# Patient Record
Sex: Female | Born: 1935
Health system: Southern US, Community
[De-identification: ages and names within clinical notes are randomized; demographics above are authoritative.]

## PROBLEM LIST (undated history)

## (undated) DIAGNOSIS — C801 Malignant (primary) neoplasm, unspecified: Secondary | ICD-10-CM

## (undated) DIAGNOSIS — E785 Hyperlipidemia, unspecified: Secondary | ICD-10-CM

## (undated) DIAGNOSIS — R7611 Nonspecific reaction to tuberculin skin test without active tuberculosis: Secondary | ICD-10-CM

## (undated) DIAGNOSIS — M179 Osteoarthritis of knee, unspecified: Secondary | ICD-10-CM

## (undated) DIAGNOSIS — K5730 Diverticulosis of large intestine without perforation or abscess without bleeding: Secondary | ICD-10-CM

## (undated) DIAGNOSIS — Z923 Personal history of irradiation: Secondary | ICD-10-CM

## (undated) DIAGNOSIS — I10 Essential (primary) hypertension: Secondary | ICD-10-CM

## (undated) DIAGNOSIS — M1710 Unilateral primary osteoarthritis, unspecified knee: Secondary | ICD-10-CM

## (undated) HISTORY — DX: Unilateral primary osteoarthritis, unspecified knee: M17.10

## (undated) HISTORY — DX: Diverticulosis of large intestine without perforation or abscess without bleeding: K57.30

## (undated) HISTORY — DX: Malignant (primary) neoplasm, unspecified: C80.1

## (undated) HISTORY — DX: Nonspecific reaction to tuberculin skin test without active tuberculosis: R76.11

## (undated) HISTORY — DX: Osteoarthritis of knee, unspecified: M17.9

## (undated) HISTORY — DX: Essential (primary) hypertension: I10

## (undated) HISTORY — DX: Hyperlipidemia, unspecified: E78.5

---

## 1998-08-01 ENCOUNTER — Ambulatory Visit (HOSPITAL_COMMUNITY): Admission: RE | Admit: 1998-08-01 | Discharge: 1998-08-01 | Payer: Self-pay | Admitting: Obstetrics & Gynecology

## 1999-08-20 ENCOUNTER — Encounter: Payer: Self-pay | Admitting: Internal Medicine

## 1999-08-20 ENCOUNTER — Ambulatory Visit (HOSPITAL_COMMUNITY): Admission: RE | Admit: 1999-08-20 | Discharge: 1999-08-20 | Payer: Self-pay | Admitting: Internal Medicine

## 1999-11-24 ENCOUNTER — Emergency Department (HOSPITAL_COMMUNITY): Admission: EM | Admit: 1999-11-24 | Discharge: 1999-11-24 | Payer: Self-pay | Admitting: *Deleted

## 2002-01-17 LAB — HM COLONOSCOPY

## 2002-09-16 ENCOUNTER — Encounter: Payer: Self-pay | Admitting: Emergency Medicine

## 2002-09-16 ENCOUNTER — Emergency Department (HOSPITAL_COMMUNITY): Admission: EM | Admit: 2002-09-16 | Discharge: 2002-09-16 | Payer: Self-pay | Admitting: Emergency Medicine

## 2005-05-25 ENCOUNTER — Ambulatory Visit: Payer: Self-pay | Admitting: Internal Medicine

## 2005-05-27 ENCOUNTER — Ambulatory Visit: Payer: Self-pay | Admitting: Internal Medicine

## 2005-06-30 ENCOUNTER — Ambulatory Visit (HOSPITAL_COMMUNITY): Admission: RE | Admit: 2005-06-30 | Discharge: 2005-06-30 | Payer: Self-pay | Admitting: Internal Medicine

## 2006-07-12 ENCOUNTER — Ambulatory Visit: Payer: Self-pay | Admitting: Internal Medicine

## 2006-07-12 LAB — CONVERTED CEMR LAB
CO2: 29 meq/L (ref 19–32)
Calcium: 9.6 mg/dL (ref 8.4–10.5)
Chol/HDL Ratio, serum: 3.6
Cholesterol: 192 mg/dL (ref 0–200)
Creatinine, Ser: 0.8 mg/dL (ref 0.4–1.2)
Crystals: NEGATIVE
Eosinophil percent: 2 % (ref 0.0–5.0)
GFR calc non Af Amer: 74 mL/min
Glomerular Filtration Rate, Af Am: 90 mL/min/{1.73_m2}
HCT: 39 % (ref 36.0–46.0)
HDL: 53.6 mg/dL (ref >39.0)
Hemoglobin, Urine: NEGATIVE
Lymphocytes Relative: 40.7 % (ref 12.0–46.0)
MCHC: 33.2 g/dL (ref 30.0–36.0)
MCV: 84.7 fL (ref 78.0–100.0)
Monocytes Absolute: 0.3 10*3/uL (ref 0.2–0.7)
Monocytes Relative: 5.8 % (ref 3.0–11.0)
Neutro Abs: 2.5 10*3/uL (ref 1.4–7.7)
Platelets: 403 10*3/uL — ABNORMAL HIGH (ref 150–400)
Potassium: 3.3 meq/L — ABNORMAL LOW (ref 3.5–5.1)
RBC / HPF: NONE SEEN
RBC: 4.6 M/uL (ref 3.87–5.11)
Total Protein, Urine: NEGATIVE mg/dL
Total Protein: 7.4 g/dL (ref 6.0–8.3)
Triglyceride fasting, serum: 70 mg/dL (ref 0–149)
Urobilinogen, UA: 0.2 (ref 0.0–1.0)
VLDL: 14 mg/dL (ref 0–40)
WBC: 4.9 10*3/uL (ref 4.5–10.5)
pH: 6.5 (ref 5.0–8.0)

## 2006-07-13 ENCOUNTER — Ambulatory Visit (HOSPITAL_COMMUNITY): Admission: RE | Admit: 2006-07-13 | Discharge: 2006-07-13 | Payer: Self-pay | Admitting: Internal Medicine

## 2006-07-23 ENCOUNTER — Encounter (INDEPENDENT_AMBULATORY_CARE_PROVIDER_SITE_OTHER): Payer: Self-pay | Admitting: Specialist

## 2006-07-23 ENCOUNTER — Encounter: Admission: RE | Admit: 2006-07-23 | Discharge: 2006-07-23 | Payer: Self-pay | Admitting: Internal Medicine

## 2006-07-23 ENCOUNTER — Encounter (INDEPENDENT_AMBULATORY_CARE_PROVIDER_SITE_OTHER): Payer: Self-pay | Admitting: Diagnostic Radiology

## 2006-08-01 ENCOUNTER — Encounter: Admission: RE | Admit: 2006-08-01 | Discharge: 2006-08-01 | Payer: Self-pay | Admitting: Internal Medicine

## 2006-08-03 ENCOUNTER — Emergency Department (HOSPITAL_COMMUNITY): Admission: EM | Admit: 2006-08-03 | Discharge: 2006-08-03 | Payer: Self-pay | Admitting: Emergency Medicine

## 2006-08-04 ENCOUNTER — Ambulatory Visit: Payer: Self-pay | Admitting: Family Medicine

## 2006-08-25 DIAGNOSIS — Z853 Personal history of malignant neoplasm of breast: Secondary | ICD-10-CM

## 2006-08-31 ENCOUNTER — Ambulatory Visit: Payer: Self-pay | Admitting: Internal Medicine

## 2006-08-31 LAB — CONVERTED CEMR LAB
BUN: 8 mg/dL (ref 6–23)
Calcium: 9.4 mg/dL (ref 8.4–10.5)
Chloride: 104 meq/L (ref 96–112)
Cholesterol: 188 mg/dL (ref 0–200)
GFR calc Af Amer: 445 mL/min
GFR calc non Af Amer: 368 mL/min
Glucose, Bld: 89 mg/dL (ref 70–99)
HDL: 50.6 mg/dL (ref >39.0)
Total CHOL/HDL Ratio: 3.7

## 2007-05-05 DIAGNOSIS — R7611 Nonspecific reaction to tuberculin skin test without active tuberculosis: Secondary | ICD-10-CM | POA: Insufficient documentation

## 2007-05-05 DIAGNOSIS — E118 Type 2 diabetes mellitus with unspecified complications: Secondary | ICD-10-CM | POA: Insufficient documentation

## 2007-05-05 DIAGNOSIS — I10 Essential (primary) hypertension: Secondary | ICD-10-CM | POA: Insufficient documentation

## 2007-05-05 DIAGNOSIS — M810 Age-related osteoporosis without current pathological fracture: Secondary | ICD-10-CM | POA: Insufficient documentation

## 2007-05-05 DIAGNOSIS — K5730 Diverticulosis of large intestine without perforation or abscess without bleeding: Secondary | ICD-10-CM | POA: Insufficient documentation

## 2007-05-05 DIAGNOSIS — E785 Hyperlipidemia, unspecified: Secondary | ICD-10-CM | POA: Insufficient documentation

## 2007-05-05 DIAGNOSIS — M1710 Unilateral primary osteoarthritis, unspecified knee: Secondary | ICD-10-CM

## 2007-08-04 ENCOUNTER — Encounter: Admission: RE | Admit: 2007-08-04 | Discharge: 2007-08-04 | Payer: Self-pay | Admitting: Internal Medicine

## 2007-08-30 ENCOUNTER — Encounter: Payer: Self-pay | Admitting: Internal Medicine

## 2007-10-31 ENCOUNTER — Encounter: Payer: Self-pay | Admitting: Internal Medicine

## 2008-01-30 ENCOUNTER — Telehealth (INDEPENDENT_AMBULATORY_CARE_PROVIDER_SITE_OTHER): Payer: Self-pay | Admitting: *Deleted

## 2008-01-31 ENCOUNTER — Telehealth (INDEPENDENT_AMBULATORY_CARE_PROVIDER_SITE_OTHER): Payer: Self-pay | Admitting: *Deleted

## 2008-02-27 ENCOUNTER — Telehealth: Payer: Self-pay | Admitting: Internal Medicine

## 2008-03-29 ENCOUNTER — Telehealth: Payer: Self-pay | Admitting: Internal Medicine

## 2008-04-02 ENCOUNTER — Encounter: Payer: Self-pay | Admitting: Internal Medicine

## 2008-04-02 ENCOUNTER — Ambulatory Visit: Payer: Self-pay | Admitting: Internal Medicine

## 2008-04-02 DIAGNOSIS — I509 Heart failure, unspecified: Secondary | ICD-10-CM | POA: Insufficient documentation

## 2008-04-11 ENCOUNTER — Encounter: Payer: Self-pay | Admitting: Internal Medicine

## 2008-04-11 ENCOUNTER — Ambulatory Visit: Payer: Self-pay

## 2008-04-27 ENCOUNTER — Telehealth: Payer: Self-pay | Admitting: Internal Medicine

## 2008-06-29 HISTORY — PX: BREAST LUMPECTOMY: SHX2

## 2008-06-29 HISTORY — PX: BREAST SURGERY: SHX581

## 2008-07-23 ENCOUNTER — Telehealth: Payer: Self-pay | Admitting: Internal Medicine

## 2008-07-30 ENCOUNTER — Telehealth (INDEPENDENT_AMBULATORY_CARE_PROVIDER_SITE_OTHER): Payer: Self-pay | Admitting: *Deleted

## 2008-09-20 ENCOUNTER — Encounter: Admission: RE | Admit: 2008-09-20 | Discharge: 2008-09-20 | Payer: Self-pay | Admitting: Internal Medicine

## 2008-10-01 ENCOUNTER — Encounter: Payer: Self-pay | Admitting: Internal Medicine

## 2008-10-08 ENCOUNTER — Telehealth (INDEPENDENT_AMBULATORY_CARE_PROVIDER_SITE_OTHER): Payer: Self-pay | Admitting: *Deleted

## 2008-10-10 ENCOUNTER — Ambulatory Visit (HOSPITAL_COMMUNITY): Admission: RE | Admit: 2008-10-10 | Discharge: 2008-10-10 | Payer: Self-pay | Admitting: Surgery

## 2008-10-10 ENCOUNTER — Encounter: Admission: RE | Admit: 2008-10-10 | Discharge: 2008-10-10 | Payer: Self-pay | Admitting: Surgery

## 2008-10-10 ENCOUNTER — Encounter (INDEPENDENT_AMBULATORY_CARE_PROVIDER_SITE_OTHER): Payer: Self-pay | Admitting: Surgery

## 2008-10-12 ENCOUNTER — Ambulatory Visit: Payer: Self-pay | Admitting: Oncology

## 2008-10-22 ENCOUNTER — Encounter: Payer: Self-pay | Admitting: Internal Medicine

## 2008-10-31 ENCOUNTER — Encounter: Payer: Self-pay | Admitting: Internal Medicine

## 2008-10-31 LAB — CBC WITH DIFFERENTIAL/PLATELET
BASO%: 0.3 % (ref 0.0–2.0)
LYMPH%: 29.6 % (ref 14.0–49.7)
MCHC: 33.7 g/dL (ref 31.5–36.0)
MCV: 84 fL (ref 79.5–101.0)
MONO%: 8.6 % (ref 0.0–14.0)
Platelets: 376 10*3/uL (ref 145–400)
RBC: 4.21 10*6/uL (ref 3.70–5.45)

## 2008-11-01 LAB — COMPREHENSIVE METABOLIC PANEL
ALT: 12 U/L (ref 0–35)
Alkaline Phosphatase: 79 U/L (ref 39–117)
Creatinine, Ser: 0.71 mg/dL (ref 0.40–1.20)
Glucose, Bld: 104 mg/dL — ABNORMAL HIGH (ref 70–99)
Sodium: 137 mEq/L (ref 135–145)
Total Bilirubin: 0.3 mg/dL (ref 0.3–1.2)
Total Protein: 7.5 g/dL (ref 6.0–8.3)

## 2008-11-01 LAB — CANCER ANTIGEN 27.29: CA 27.29: 24 U/mL (ref 0–39)

## 2008-11-01 LAB — VITAMIN D 25 HYDROXY (VIT D DEFICIENCY, FRACTURES): Vit D, 25-Hydroxy: 43 ng/mL (ref 30–89)

## 2008-11-06 ENCOUNTER — Encounter: Payer: Self-pay | Admitting: Internal Medicine

## 2008-11-13 ENCOUNTER — Ambulatory Visit: Admission: RE | Admit: 2008-11-13 | Discharge: 2009-01-30 | Payer: Self-pay | Admitting: Radiation Oncology

## 2008-11-14 ENCOUNTER — Encounter: Payer: Self-pay | Admitting: Internal Medicine

## 2008-11-22 ENCOUNTER — Encounter: Payer: Self-pay | Admitting: Internal Medicine

## 2008-11-22 ENCOUNTER — Ambulatory Visit: Payer: Self-pay | Admitting: Internal Medicine

## 2009-01-23 ENCOUNTER — Encounter: Payer: Self-pay | Admitting: Internal Medicine

## 2009-02-12 ENCOUNTER — Ambulatory Visit: Payer: Self-pay | Admitting: Oncology

## 2009-02-14 ENCOUNTER — Encounter: Payer: Self-pay | Admitting: Internal Medicine

## 2009-02-14 LAB — COMPREHENSIVE METABOLIC PANEL
ALT: 22 U/L (ref 0–35)
AST: 36 U/L (ref 0–37)
Alkaline Phosphatase: 70 U/L (ref 39–117)
Sodium: 138 mEq/L (ref 135–145)
Total Bilirubin: 0.4 mg/dL (ref 0.3–1.2)
Total Protein: 7.7 g/dL (ref 6.0–8.3)

## 2009-02-14 LAB — CBC WITH DIFFERENTIAL/PLATELET
BASO%: 0.5 % (ref 0.0–2.0)
LYMPH%: 36.9 % (ref 14.0–49.7)
MCHC: 33.6 g/dL (ref 31.5–36.0)
MCV: 85.6 fL (ref 79.5–101.0)
MONO#: 0.3 10*3/uL (ref 0.1–0.9)
MONO%: 7.3 % (ref 0.0–14.0)
Platelets: 343 10*3/uL (ref 145–400)
RBC: 4.27 10*6/uL (ref 3.70–5.45)
WBC: 3.9 10*3/uL (ref 3.9–10.3)

## 2009-02-15 ENCOUNTER — Telehealth: Payer: Self-pay | Admitting: Internal Medicine

## 2009-02-20 ENCOUNTER — Encounter: Payer: Self-pay | Admitting: Internal Medicine

## 2009-04-09 ENCOUNTER — Telehealth: Payer: Self-pay | Admitting: Internal Medicine

## 2009-04-18 ENCOUNTER — Ambulatory Visit: Payer: Self-pay | Admitting: Internal Medicine

## 2009-04-18 LAB — CONVERTED CEMR LAB
BUN: 14 mg/dL (ref 6–23)
Bilirubin Urine: NEGATIVE
CO2: 28 meq/L (ref 19–32)
Calcium: 9.5 mg/dL (ref 8.4–10.5)
Chloride: 103 meq/L (ref 96–112)
Cholesterol, target level: 200 mg/dL
Cholesterol: 134 mg/dL (ref 0–200)
Creatinine, Ser: 0.9 mg/dL (ref 0.4–1.2)
Eosinophils Relative: 1.8 % (ref 0.0–5.0)
Glucose, Bld: 89 mg/dL (ref 70–99)
HDL goal, serum: 40 mg/dL
Hemoglobin, Urine: NEGATIVE
Hemoglobin: 13.1 g/dL (ref 12.0–15.0)
Hgb A1c MFr Bld: 6.4 % (ref 4.6–6.5)
LDL Goal: 100 mg/dL
MCHC: 33.6 g/dL (ref 30.0–36.0)
MCV: 87.7 fL (ref 78.0–100.0)
Microalb, Ur: 0.3 mg/dL (ref 0.0–1.9)
Monocytes Relative: 9.5 % (ref 3.0–12.0)
Neutrophils Relative %: 50.5 % (ref 43.0–77.0)
Nitrite: NEGATIVE
Potassium: 3.3 meq/L — ABNORMAL LOW (ref 3.5–5.1)
RBC: 4.43 M/uL (ref 3.87–5.11)
Sodium: 141 meq/L (ref 135–145)
Specific Gravity, Urine: 1.015 (ref 1.000–1.030)
TSH: 2.5 microintl units/mL (ref 0.35–5.50)
Total Bilirubin: 0.3 mg/dL (ref 0.3–1.2)
Triglycerides: 67 mg/dL (ref 0.0–149.0)
VLDL: 13.4 mg/dL (ref 0.0–40.0)
Vit D, 25-Hydroxy: 43 ng/mL (ref 30–89)

## 2009-04-19 ENCOUNTER — Telehealth: Payer: Self-pay | Admitting: Internal Medicine

## 2009-04-19 ENCOUNTER — Encounter: Payer: Self-pay | Admitting: Internal Medicine

## 2009-05-01 ENCOUNTER — Encounter: Payer: Self-pay | Admitting: Internal Medicine

## 2009-05-01 LAB — HM DIABETES EYE EXAM

## 2009-05-14 ENCOUNTER — Telehealth: Payer: Self-pay | Admitting: Internal Medicine

## 2009-06-20 ENCOUNTER — Ambulatory Visit: Payer: Self-pay | Admitting: Oncology

## 2009-06-25 LAB — COMPREHENSIVE METABOLIC PANEL
ALT: 20 U/L (ref 0–35)
Albumin: 3.7 g/dL (ref 3.5–5.2)
CO2: 25 mEq/L (ref 19–32)
Calcium: 9.1 mg/dL (ref 8.4–10.5)
Chloride: 101 mEq/L (ref 96–112)
Creatinine, Ser: 0.7 mg/dL (ref 0.40–1.20)
Total Protein: 7.2 g/dL (ref 6.0–8.3)

## 2009-06-25 LAB — CBC WITH DIFFERENTIAL/PLATELET
Basophils Absolute: 0 10*3/uL (ref 0.0–0.1)
Eosinophils Absolute: 0.1 10*3/uL (ref 0.0–0.5)
HGB: 12.5 g/dL (ref 11.6–15.9)
LYMPH%: 44.8 % (ref 14.0–49.7)
MCV: 86.9 fL (ref 79.5–101.0)
MONO%: 8 % (ref 0.0–14.0)
NEUT#: 2.1 10*3/uL (ref 1.5–6.5)
Platelets: 338 10*3/uL (ref 145–400)
RDW: 13.8 % (ref 11.2–14.5)

## 2009-06-25 LAB — LACTATE DEHYDROGENASE: LDH: 212 U/L (ref 94–250)

## 2009-06-26 LAB — CANCER ANTIGEN 27.29: CA 27.29: 22 U/mL (ref 0–39)

## 2009-06-26 LAB — VITAMIN D 25 HYDROXY (VIT D DEFICIENCY, FRACTURES): Vit D, 25-Hydroxy: 51 ng/mL (ref 30–89)

## 2009-07-03 ENCOUNTER — Encounter: Payer: Self-pay | Admitting: Internal Medicine

## 2009-07-17 ENCOUNTER — Ambulatory Visit: Payer: Self-pay | Admitting: Internal Medicine

## 2009-07-17 LAB — CONVERTED CEMR LAB
ALT: 21 units/L (ref 0–35)
CO2: 27 meq/L (ref 19–32)
Glucose, Bld: 115 mg/dL — ABNORMAL HIGH (ref 70–99)
HDL: 58.4 mg/dL (ref >39.00)
LDL Cholesterol: 48 mg/dL (ref 0–99)
Nitrite: NEGATIVE
Potassium: 3.3 meq/L — ABNORMAL LOW (ref 3.5–5.1)
Sodium: 140 meq/L (ref 135–145)
Specific Gravity, Urine: 1.01 (ref 1.000–1.030)
Total CHOL/HDL Ratio: 2
pH: 7 (ref 5.0–8.0)

## 2009-07-26 ENCOUNTER — Telehealth: Payer: Self-pay | Admitting: Internal Medicine

## 2009-09-11 ENCOUNTER — Encounter: Admission: RE | Admit: 2009-09-11 | Discharge: 2009-09-11 | Payer: Self-pay | Admitting: Oncology

## 2009-09-11 LAB — HM MAMMOGRAPHY: HM Mammogram: NORMAL

## 2009-10-17 ENCOUNTER — Ambulatory Visit: Payer: Self-pay | Admitting: Internal Medicine

## 2009-10-17 DIAGNOSIS — E876 Hypokalemia: Secondary | ICD-10-CM

## 2009-10-17 LAB — CONVERTED CEMR LAB
BUN: 11 mg/dL (ref 6–23)
Bilirubin Urine: NEGATIVE
CO2: 28 meq/L (ref 19–32)
Calcium: 9.7 mg/dL (ref 8.4–10.5)
Chloride: 104 meq/L (ref 96–112)
Creatinine, Ser: 0.7 mg/dL (ref 0.4–1.2)
GFR calc non Af Amer: 103.81 mL/min (ref >60)
Glucose, Bld: 108 mg/dL — ABNORMAL HIGH (ref 70–99)
Hemoglobin, Urine: NEGATIVE
Hgb A1c MFr Bld: 6.4 % (ref 4.6–6.5)
Sodium: 139 meq/L (ref 135–145)
Total Protein, Urine: NEGATIVE mg/dL
Urine Glucose: NEGATIVE mg/dL
Urobilinogen, UA: 0.2 (ref 0.0–1.0)
pH: 6.5 (ref 5.0–8.0)

## 2009-11-22 ENCOUNTER — Telehealth: Payer: Self-pay | Admitting: Internal Medicine

## 2009-12-12 ENCOUNTER — Encounter: Payer: Self-pay | Admitting: Internal Medicine

## 2009-12-24 ENCOUNTER — Ambulatory Visit (HOSPITAL_BASED_OUTPATIENT_CLINIC_OR_DEPARTMENT_OTHER): Payer: Self-pay | Admitting: Oncology

## 2010-01-01 ENCOUNTER — Encounter: Payer: Self-pay | Admitting: Internal Medicine

## 2010-01-01 LAB — COMPREHENSIVE METABOLIC PANEL
AST: 24 U/L (ref 0–37)
Albumin: 3.6 g/dL (ref 3.5–5.2)
BUN: 10 mg/dL (ref 6–23)
CO2: 24 mEq/L (ref 19–32)
Calcium: 8.9 mg/dL (ref 8.4–10.5)
Chloride: 106 mEq/L (ref 96–112)
Glucose, Bld: 89 mg/dL (ref 70–99)
Total Bilirubin: 0.5 mg/dL (ref 0.3–1.2)
Total Protein: 7.1 g/dL (ref 6.0–8.3)

## 2010-01-01 LAB — CBC WITH DIFFERENTIAL/PLATELET
HCT: 35.3 % (ref 34.8–46.6)
HGB: 12.1 g/dL (ref 11.6–15.9)
MCH: 29.6 pg (ref 25.1–34.0)
MCV: 86.7 fL (ref 79.5–101.0)
NEUT#: 2.4 10*3/uL (ref 1.5–6.5)
RBC: 4.08 10*6/uL (ref 3.70–5.45)
WBC: 4.6 10*3/uL (ref 3.9–10.3)

## 2010-01-01 LAB — VITAMIN D 25 HYDROXY (VIT D DEFICIENCY, FRACTURES): Vit D, 25-Hydroxy: 35 ng/mL (ref 30–89)

## 2010-02-05 ENCOUNTER — Ambulatory Visit: Payer: Self-pay | Admitting: Internal Medicine

## 2010-02-05 LAB — CONVERTED CEMR LAB
ALT: 18 units/L (ref 0–35)
Alkaline Phosphatase: 58 units/L (ref 39–117)
BUN: 10 mg/dL (ref 6–23)
Basophils Relative: 0.4 % (ref 0.0–3.0)
Bilirubin Urine: NEGATIVE
Bilirubin, Direct: 0.1 mg/dL (ref 0.0–0.3)
Creatinine, Ser: 0.6 mg/dL (ref 0.4–1.2)
Eosinophils Absolute: 0.1 10*3/uL (ref 0.0–0.7)
HCT: 37.6 % (ref 36.0–46.0)
HDL: 54.5 mg/dL (ref >39.00)
Hgb A1c MFr Bld: 6.2 % (ref 4.6–6.5)
Ketones, ur: NEGATIVE mg/dL
Lymphocytes Relative: 46.2 % — ABNORMAL HIGH (ref 12.0–46.0)
MCHC: 33.5 g/dL (ref 30.0–36.0)
MCV: 87.4 fL (ref 78.0–100.0)
Monocytes Relative: 8.4 % (ref 3.0–12.0)
Neutrophils Relative %: 42.3 % — ABNORMAL LOW (ref 43.0–77.0)
Platelets: 300 10*3/uL (ref 150.0–400.0)
RDW: 14 % (ref 11.5–14.6)
Sodium: 140 meq/L (ref 135–145)
TSH: 2.54 microintl units/mL (ref 0.35–5.50)
Total CHOL/HDL Ratio: 3
Triglycerides: 91 mg/dL (ref 0.0–149.0)
Urine Glucose: NEGATIVE mg/dL
pH: 6.5 (ref 5.0–8.0)

## 2010-02-12 ENCOUNTER — Telehealth: Payer: Self-pay | Admitting: Internal Medicine

## 2010-02-13 ENCOUNTER — Ambulatory Visit: Payer: Self-pay | Admitting: Internal Medicine

## 2010-02-13 DIAGNOSIS — N390 Urinary tract infection, site not specified: Secondary | ICD-10-CM | POA: Insufficient documentation

## 2010-02-13 LAB — CONVERTED CEMR LAB
Bilirubin Urine: NEGATIVE
Ketones, ur: NEGATIVE mg/dL
Nitrite: NEGATIVE
Total Protein, Urine: NEGATIVE mg/dL
Urobilinogen, UA: 0.2 (ref 0.0–1.0)
pH: 5 (ref 5.0–8.0)

## 2010-02-17 ENCOUNTER — Telehealth: Payer: Self-pay | Admitting: Internal Medicine

## 2010-03-11 ENCOUNTER — Telehealth: Payer: Self-pay | Admitting: Internal Medicine

## 2010-04-21 ENCOUNTER — Telehealth: Payer: Self-pay | Admitting: Internal Medicine

## 2010-04-22 ENCOUNTER — Telehealth: Payer: Self-pay | Admitting: Internal Medicine

## 2010-05-27 ENCOUNTER — Ambulatory Visit: Payer: Self-pay | Admitting: Internal Medicine

## 2010-05-27 LAB — CONVERTED CEMR LAB
ALT: 19 units/L (ref 0–35)
AST: 22 units/L (ref 0–37)
Alkaline Phosphatase: 62 units/L (ref 39–117)
Basophils Absolute: 0 10*3/uL (ref 0.0–0.1)
Bilirubin, Direct: 0 mg/dL (ref 0.0–0.3)
Creatinine, Ser: 0.6 mg/dL (ref 0.4–1.2)
Eosinophils Relative: 2.8 % (ref 0.0–5.0)
Glucose, Bld: 120 mg/dL — ABNORMAL HIGH (ref 70–99)
HCT: 38.1 % (ref 36.0–46.0)
LDL Cholesterol: 46 mg/dL (ref 0–99)
Lymphocytes Relative: 44.1 % (ref 12.0–46.0)
Lymphs Abs: 1.7 10*3/uL (ref 0.7–4.0)
MCHC: 33.7 g/dL (ref 30.0–36.0)
Monocytes Absolute: 0.3 10*3/uL (ref 0.1–1.0)
Monocytes Relative: 8 % (ref 3.0–12.0)
Neutro Abs: 1.7 10*3/uL (ref 1.4–7.7)
RBC: 4.36 M/uL (ref 3.87–5.11)
RDW: 13.6 % (ref 11.5–14.6)
Total Bilirubin: 0.2 mg/dL — ABNORMAL LOW (ref 0.3–1.2)
Total CHOL/HDL Ratio: 2
Total Protein: 6.7 g/dL (ref 6.0–8.3)
VLDL: 10.6 mg/dL (ref 0.0–40.0)
WBC: 3.8 10*3/uL — ABNORMAL LOW (ref 4.5–10.5)

## 2010-05-27 LAB — HM DIABETES FOOT EXAM

## 2010-06-18 ENCOUNTER — Telehealth: Payer: Self-pay | Admitting: Internal Medicine

## 2010-07-17 ENCOUNTER — Encounter: Payer: Self-pay | Admitting: Internal Medicine

## 2010-07-20 ENCOUNTER — Telehealth: Payer: Self-pay | Admitting: Internal Medicine

## 2010-07-21 ENCOUNTER — Encounter: Payer: Self-pay | Admitting: Oncology

## 2010-07-27 LAB — CONVERTED CEMR LAB
Basophils Absolute: 0 10*3/uL (ref 0.0–0.1)
CO2: 29 meq/L (ref 19–32)
Creatinine,U: 91.7 mg/dL
Eosinophils Relative: 1.8 % (ref 0.0–5.0)
Glucose, Bld: 121 mg/dL — ABNORMAL HIGH (ref 70–99)
Hemoglobin, Urine: NEGATIVE
Hemoglobin: 13.1 g/dL (ref 12.0–15.0)
MCV: 84.6 fL (ref 78.0–100.0)
Microalb Creat Ratio: 5.5 mg/g (ref 0.0–30.0)
Monocytes Absolute: 0.4 10*3/uL (ref 0.1–1.0)
Monocytes Relative: 7.7 % (ref 3.0–12.0)
Mucus, UA: NEGATIVE
Neutro Abs: 2.1 10*3/uL (ref 1.4–7.7)
Neutrophils Relative %: 44.2 % (ref 43.0–77.0)
Potassium: 3.4 meq/L — ABNORMAL LOW (ref 3.5–5.1)
TSH: 1.65 microintl units/mL (ref 0.35–5.50)
Total Bilirubin: 0.8 mg/dL (ref 0.3–1.2)
Triglycerides: 60 mg/dL (ref 0–149)
Urine Glucose: NEGATIVE mg/dL
WBC: 4.7 10*3/uL (ref 4.5–10.5)

## 2010-07-30 ENCOUNTER — Ambulatory Visit: Payer: Self-pay | Admitting: Oncology

## 2010-07-30 DIAGNOSIS — C50519 Malignant neoplasm of lower-outer quadrant of unspecified female breast: Secondary | ICD-10-CM

## 2010-07-30 LAB — COMPREHENSIVE METABOLIC PANEL
AST: 22 U/L (ref 0–37)
Albumin: 3.5 g/dL (ref 3.5–5.2)
BUN: 6 mg/dL (ref 6–23)
CO2: 25 mEq/L (ref 19–32)
Calcium: 9.3 mg/dL (ref 8.4–10.5)
Creatinine, Ser: 0.82 mg/dL (ref 0.40–1.20)
Total Protein: 7.1 g/dL (ref 6.0–8.3)

## 2010-07-30 LAB — CBC WITH DIFFERENTIAL/PLATELET
Basophils Absolute: 0 10*3/uL (ref 0.0–0.1)
EOS%: 1.9 % (ref 0.0–7.0)
MCH: 29 pg (ref 25.1–34.0)
MONO%: 8 % (ref 0.0–14.0)
NEUT%: 47.3 % (ref 38.4–76.8)
RBC: 4.3 10*6/uL (ref 3.70–5.45)
WBC: 4.5 10*3/uL (ref 3.9–10.3)
lymph#: 1.9 10*3/uL (ref 0.9–3.3)

## 2010-07-31 LAB — CANCER ANTIGEN 27.29: CA 27.29: 19 U/mL (ref 0–39)

## 2010-07-31 NOTE — Letter (Signed)
Summary: [facility]   Imported By: Sherian Rein 07/24/2010 09:47:46  _____________________________________________________________________  External Attachment:    Type:   Image     Comment:   External Document

## 2010-07-31 NOTE — Progress Notes (Signed)
  Phone Note Refill Request Message from:  Fax from Pharmacy on April 22, 2010 2:44 PM  Refills Requested: Medication #1:  AMLODIPINE BESYLATE 10 MG TABS Take 1 tablet by mouth once a day Initial call taken by: Rock Nephew CMA,  April 22, 2010 2:44 PM    Prescriptions: AMLODIPINE BESYLATE 10 MG TABS (AMLODIPINE BESYLATE) Take 1 tablet by mouth once a day  #90 x 3   Entered by:   Rock Nephew CMA   Authorized by:   Etta Grandchild MD   Signed by:   Rock Nephew CMA on 04/22/2010   Method used:   Faxed to ...       [facility] (retail)       [address]       [place], [place]  [address]       Ph: [phone]       Fax: [phone]   RxID:   [id number]

## 2010-07-31 NOTE — Letter (Signed)
Summary: [facility]   Imported By: Sherian Rein 12/23/2009 10:36:53  _____________________________________________________________________  External Attachment:    Type:   Image     Comment:   External Document

## 2010-07-31 NOTE — Progress Notes (Signed)
Summary: refill  Phone Note Refill Request Message from:  Fax from Pharmacy on June 18, 2010 9:43 AM  Refills Requested: Medication #1:  KLOR-CON M20 20 MEQ CR-TABS 1 three times a day   Last Refilled: 03/19/2010 Initial call taken by: Rock Nephew CMA,  June 18, 2010 9:43 AM    Prescriptions: KLOR-CON M20 20 MEQ CR-TABS (POTASSIUM CHLORIDE CRYS CR) 1 three times a day  #90 x 11   Entered by:   Rock Nephew CMA   Authorized by:   Etta Grandchild MD   Signed by:   Rock Nephew CMA on 06/18/2010   Method used:   Faxed to ...       [facility] (retail)       [address]       [place], [place]  [address]       Ph: [phone]       Fax: [phone]   RxID:   [id number]

## 2010-07-31 NOTE — Letter (Signed)
Summary: Lipid Letter  [facility]  [address] [place], [place] [address]   Phone: [phone]  Fax: [phone]    02/05/2010  Brittany [name] [address] [place], [place]  [address]  Dear Ms. Primmer:  We have carefully reviewed your last lipid profile from 02/05/2010 and the results are noted below with a summary of recommendations for lipid management.    Cholesterol:       151     Goal: <200   HDL "good" Cholesterol:   57.84     Goal: >40   LDL "bad" Cholesterol:   78     Goal: <100   Triglycerides:       91.0     Goal: <150        TLC Diet (Therapeutic Lifestyle Change): Saturated Fats & Transfatty acids should be kept < 7% of total calories ***Reduce Saturated Fats Polyunstaurated Fat can be up to 10% of total calories Monounsaturated Fat Fat can be up to 20% of total calories Total Fat should be no greater than 25-35% of total calories Carbohydrates should be 50-60% of total calories Protein should be approximately 15% of total calories Fiber should be at least 20-30 grams a day ***Increased fiber may help lower LDL Total Cholesterol should be < 200mg /day Consider adding plant stanol/sterols to diet (example: Benacol spread) ***A higher intake of unsaturated fat may reduce Triglycerides and Increase HDL    Adjunctive Measures (may lower LIPIDS and reduce risk of Heart Attack) include: Aerobic Exercise (20-30 minutes 3-4 times a week) Limit Alcohol Consumption Weight Reduction Aspirin 75-81 mg a day by mouth (if not allergic or contraindicated) Dietary Fiber 20-30 grams a day by mouth     Current Medications: 1)    Amlodipine Besylate 10 Mg Tabs (Amlodipine besylate) .... Take 1 tablet by mouth once a day 2)    Metformin Hcl 500 Mg Tb24 (Metformin hcl) .... Take 1 tablet by mouth once daily 3)    Ecotrin Low Strength 81 Mg  Tbec (Aspirin) .Marland Kitchen.. 1 by mouth qd 4)    Furosemide 20 Mg Tabs (Furosemide) .Marland Kitchen.. 1 by mouth once daily 5)    Klor-con M20  20 Meq Cr-tabs (Potassium chloride crys cr) .Marland Kitchen.. 1 three times a day 6)    High Potency D-1000  7)    Glucosamine  8)    Crestor 10 Mg Tabs (Rosuvastatin calcium) .... One by mouth once daily for cholesterol 9)    Tamoxifen Citrate 20 Mg Tabs (Tamoxifen citrate) .... One by mouth once daily  If you have any questions, please call. We appreciate being able to work with you.   Sincerely,    [facility] Etta Grandchild MD

## 2010-07-31 NOTE — Letter (Signed)
Summary: Results Follow-up Letter  [facility]  [address] [place], [place] [address]   Phone: [phone]  Fax: [phone]    02/05/2010  [address] [place], [place]  [address]  Dear Ms. BAJAJ,   The following are the results of your recent test(s):  Test     Result     Urine       evidence of infection CBC       normal Liver/kidney   normal Blood sugars   normal Urine       normal   _________________________________________________________  Please call for an appointment soon _________________________________________________________ _________________________________________________________ _________________________________________________________  Sincerely,  Sanda Linger MD [facility]

## 2010-07-31 NOTE — Assessment & Plan Note (Signed)
Summary: 3 MO ROV /NWS   Vital Signs:  Patient profile:   77 year old female Height:      59 inches (149.86 cm) Weight:      185.50 pounds (84.32 kg) BMI:     37.60 O2 Sat:      98 % on Room air Temp:     98.5 degrees F (36.94 degrees C) oral Pulse rate:   68 / minute Pulse rhythm:   regular Resp:     16 per minute BP sitting:   132 / 84  (left arm) Cuff size:   large  Vitals Entered By: Brenton Grills (October 17, 2009 8:58 AM)  Nutrition Counseling: Patient's BMI is greater than 25 and therefore counseled on weight management options.  O2 Flow:  Room air CC: pt here for 3 mo f/u visit/aj, Hypertension Management, Lipid Management   Primary Care Provider:  Etta Grandchild MD  CC:  pt here for 3 mo f/u visit/aj, Hypertension Management, and Lipid Management.  History of Present Illness:  Follow-Up Visit      This is a 77 year old woman who presents for Follow-up visit.  The patient denies chest pain, palpitations, dizziness, syncope, edema, SOB, DOE, PND, and orthopnea.  Since the last visit the patient notes no new problems or concerns.  The patient reports monitoring BP, monitoring blood sugars, and dietary compliance.  When questioned about possible medication side effects, the patient notes none.    Hypertension History:      She denies headache, chest pain, palpitations, dyspnea with exertion, orthopnea, peripheral edema, visual symptoms, neurologic problems, syncope, and side effects from treatment.  She notes no problems with any antihypertensive medication side effects.        Positive major cardiovascular risk factors include female age 75 years old or older, diabetes, hyperlipidemia, and hypertension.  Negative major cardiovascular risk factors include negative family history for ischemic heart disease and non-tobacco-user status.        Positive history for target organ damage include cardiac end organ damage (either CHF or LVH).  Further assessment for target organ  damage reveals no history of ASHD, stroke/TIA, peripheral vascular disease, renal insufficiency, or hypertensive retinopathy.    Lipid Management History:      Positive NCEP/ATP III risk factors include female age 75 years old or older, diabetes, and hypertension.  Negative NCEP/ATP III risk factors include no family history for ischemic heart disease, non-tobacco-user status, no ASHD (atherosclerotic heart disease), no prior stroke/TIA, no peripheral vascular disease, and no history of aortic aneurysm.        The patient states that she knows about the "Therapeutic Lifestyle Change" diet.  Her compliance with the TLC diet is fair.  The patient expresses understanding of adjunctive measures for cholesterol lowering.  Adjunctive measures started by the patient include aerobic exercise, fiber, ASA, limit alcohol consumpton, and weight reduction.  She expresses no side effects from her lipid-lowering medication.  The patient denies any symptoms to suggest myopathy or liver disease.      Preventive Screening-Counseling & Management  Alcohol-Tobacco     Alcohol drinks/day: 0     Smoking Status: never  Current Medications (verified): 1)  Amlodipine Besylate 10 Mg Tabs (Amlodipine Besylate) .... Take 1 Tablet By Mouth Once A Day 2)  Metformin Hcl 500 Mg Tb24 (Metformin Hcl) .... Take 1 Tablet By Mouth Once Daily 3)  Ecotrin Low Strength 81 Mg  Tbec (Aspirin) .Marland Kitchen.. 1 By Mouth Qd 4)  Hydrochlorothiazide  25 Mg  Tabs (Hydrochlorothiazide) .... Take 1 Tab By Mouth Every Morning 5)  Furosemide 20 Mg Tabs (Furosemide) .Marland Kitchen.. 1 By Mouth Once Daily 6)  Simvastatin 40 Mg Tabs (Simvastatin) .Marland Kitchen.. 1 By Mouth Once Daily 7)  Klor-Con M20 20 Meq Cr-Tabs (Potassium Chloride Crys Cr) .Marland Kitchen.. 1 Three Times A Day 8)  High Potency D-1000 9)  Glucosamine 10)  Tamoxifen Citrate 20 Mg Tabs (Tamoxifen Citrate)  Allergies (verified): 1)  ! Alendronate Sodium (Alendronate Sodium) 2)  Lisinopril  Past History:  Past Medical  History: Reviewed history from 05/05/2007 and no changes required. Hypertension Osteoporosis Diabetes mellitus, type II Hyperlipidemia hx of pos PPD Diverticulosis, colon DJD left knee  Past Surgical History: Reviewed history from 05/05/2007 and no changes required. none  Family History: Reviewed history from 05/05/2007 and no changes required. Family History of CAD Female 1st degree relative Family History Diabetes 1st degree relative Family History of Stroke M 1st degree relative cervical cancer Lupus Family History Hypertension  Social History: Reviewed history from 04/02/2008 and no changes required. 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  The patient denies anorexia, fever, weight loss, chest pain, abdominal pain, hematuria, depression, enlarged lymph nodes, angioedema, and breast masses.   GU:  Denies discharge, dysuria, hematuria, incontinence, nocturia, urinary frequency, and urinary hesitancy.  Physical Exam  General:  alert, well-developed, well-nourished, and well-hydrated.  overweight-appearing.   Head:  normocephalic and atraumatic.   Mouth:  Oral mucosa and oropharynx without lesions or exudates.  Teeth in good repair. Neck:  supple, full ROM, no masses, no carotid bruits, and no neck tenderness.   Lungs:  normal respiratory effort, no intercostal retractions, no accessory muscle use, normal breath sounds, no dullness, no fremitus, no crackles, and no wheezes.   Heart:  normal rate, regular rhythm, no murmur, no gallop, no rub, and no JVD.   Abdomen:  soft, non-tender, normal bowel sounds, no distention, no masses, no guarding, no rigidity, no rebound tenderness, no abdominal hernia, no inguinal hernia, no hepatomegaly, and no splenomegaly.   Msk:  No deformity or scoliosis noted of thoracic or lumbar spine.   Pulses:  R and L  carotid,radial,femoral,dorsalis pedis and posterior tibial pulses are full and equal bilaterally Extremities:  No clubbing, cyanosis, edema, or deformity noted with normal full range of motion of all joints.   Neurologic:  No cranial nerve deficits noted. Station and gait are normal. Plantar reflexes are down-going bilaterally. DTRs are symmetrical throughout. Sensory, motor and coordinative functions appear intact. Skin:  Intact without suspicious lesions or rashes Cervical Nodes:  no anterior cervical adenopathy and no posterior cervical adenopathy.   Psych:  Cognition and judgment appear intact. Alert and cooperative with normal attention span and concentration. No apparent delusions, illusions, hallucinations  Diabetes Management Exam:    Foot Exam (with socks and/or shoes not present):       Sensory-Pinprick/Light touch:          Left medial foot (L-4): normal          Left dorsal foot (L-5): normal          Left lateral foot (S-1): normal          Right medial foot (L-4): normal          Right dorsal foot (L-5): normal  Right lateral foot (S-1): normal       Sensory-Monofilament:          Left foot: normal          Right foot: normal       Inspection:          Left foot: normal          Right foot: normal       Nails:          Left foot: normal          Right foot: normal   Impression & Recommendations:  Problem # 1:  HYPOKALEMIA, MILD (ICD-276.8) Assessment Improved  Orders: Venipuncture ([address]) TLB-BMP (Basic Metabolic Panel-BMET) (80048-METABOL) TLB-Magnesium (Mg) (83735-MG) TLB-A1C / Hgb A1C (Glycohemoglobin) (83036-A1C) TLB-Udip w/ Micro (81001-URINE)  Problem # 2:  DIABETES MELLITUS, TYPE II (ICD-250.00) Assessment: Improved  Her updated medication list for this problem includes:    Metformin Hcl 500 Mg Tb24 (Metformin hcl) .Marland Kitchen... Take 1 tablet by mouth once daily    Ecotrin Low Strength 81 Mg Tbec (Aspirin) .Marland Kitchen... 1 by mouth qd  Orders: Venipuncture  ([address]) TLB-BMP (Basic Metabolic Panel-BMET) (80048-METABOL) TLB-Magnesium (Mg) (83735-MG) TLB-A1C / Hgb A1C (Glycohemoglobin) (83036-A1C) TLB-Udip w/ Micro (81001-URINE)  Labs Reviewed: Creat: 0.8 (07/17/2009)     Last Eye Exam: diabetic retinopathy (05/01/2009) Reviewed HgBA1c results: 6.4 (07/17/2009)  6.4 (04/18/2009)  Problem # 3:  OSTEOPOROSIS (ICD-733.00) Assessment: Unchanged  Her updated medication list for this problem includes:    Atelvia 35 Mg Tbec (Risedronate sodium) ..... One by mouth once a week for osteoporosis  Orders: Venipuncture ([address]) TLB-BMP (Basic Metabolic Panel-BMET) (80048-METABOL) TLB-Magnesium (Mg) (83735-MG) TLB-A1C / Hgb A1C (Glycohemoglobin) (83036-A1C) TLB-Udip w/ Micro (81001-URINE)  Complete Medication List: 1)  Amlodipine Besylate 10 Mg Tabs (Amlodipine besylate) .... Take 1 tablet by mouth once a day 2)  Metformin Hcl 500 Mg Tb24 (Metformin hcl) .... Take 1 tablet by mouth once daily 3)  Ecotrin Low Strength 81 Mg Tbec (Aspirin) .Marland Kitchen.. 1 by mouth qd 4)  Hydrochlorothiazide 25 Mg Tabs (Hydrochlorothiazide) .... Take 1 tab by mouth every morning 5)  Furosemide 20 Mg Tabs (Furosemide) .Marland Kitchen.. 1 by mouth once daily 6)  Simvastatin 40 Mg Tabs (Simvastatin) .Marland Kitchen.. 1 by mouth once daily 7)  Klor-con M20 20 Meq Cr-tabs (Potassium chloride crys cr) .Marland Kitchen.. 1 three times a day 8)  High Potency D-1000  9)  Glucosamine  10)  Tamoxifen Citrate 20 Mg Tabs (Tamoxifen citrate) 11)  Atelvia 35 Mg Tbec (Risedronate sodium) .... One by mouth once a week for osteoporosis  Hypertension Assessment/Plan:      The patient's hypertensive risk group is category C: Target organ damage and/or diabetes.  Her calculated 10 year risk of coronary heart disease is 13 %.  Today's blood pressure is 132/84.  Her blood pressure goal is < 130/80.  Lipid Assessment/Plan:      Based on NCEP/ATP III, the patient's risk factor category is "history of diabetes".  The patient's lipid goals  are as follows: Total cholesterol goal is 200; LDL cholesterol goal is 100; HDL cholesterol goal is 40; Triglyceride goal is 150.    Patient Instructions: 1)  Please schedule a follow-up appointment in 3 months. 2)  It is important that you exercise regularly at least 20 minutes 5 times a week. If you develop chest pain, have severe difficulty breathing, or feel very tired , stop exercising immediately and seek medical attention. 3)  You need to lose weight. Consider a  lower calorie diet and regular exercise.  4)  Check your blood sugars regularly. If your readings are usually above 200  or below 70 you should contact our office. 5)  It is important that your Diabetic A1c level is checked every 3 months. 6)  See your eye doctor yearly to check for diabetic eye damage. 7)  Check your feet each night for sore areas, calluses or signs of infection. 8)  Check your Blood Pressure regularly. If it is above 130/80: you should make an appointment. Prescriptions: ATELVIA 35 MG TBEC (RISEDRONATE SODIUM) One by mouth once a week for osteoporosis  #8 x 0   Entered and Authorized by:   Etta Grandchild MD   Signed by:   Etta Grandchild MD on 10/17/2009   Method used:   Samples Given   RxID:   [id number]

## 2010-07-31 NOTE — Assessment & Plan Note (Signed)
Summary: 4 mo rov /nws   Vital Signs:  Patient profile:   77 year old female Menstrual status:  postmenopausal Height:      59 inches Weight:      189 pounds BMI:     38.31 O2 Sat:      96 % on Room air Temp:     97.7 degrees F oral Pulse rate:   78 / minute Pulse rhythm:   regular Resp:     16 per minute BP sitting:   130 / 82  (left arm) Cuff size:   large  Vitals Entered By: Rock Nephew CMA (May 27, 2010 8:38 AM)  O2 Flow:  Room air  Primary Care Provider:  Etta Grandchild MD   History of Present Illness:  Follow-Up Visit      This is a 77 year old woman who presents for Follow-up visit.  The patient denies chest pain, palpitations, dizziness, syncope, low blood sugar symptoms, high blood sugar symptoms, edema, SOB, DOE, PND, and orthopnea.  Since the last visit the patient notes no new problems or concerns.  The patient reports taking meds as prescribed, monitoring BP, monitoring blood sugars, and dietary noncompliance.  When questioned about possible medication side effects, the patient notes none.    Lipid Management History:      Positive NCEP/ATP III risk factors include female age 75 years old or older, diabetes, and hypertension.  Negative NCEP/ATP III risk factors include no family history for ischemic heart disease, non-tobacco-user status, no ASHD (atherosclerotic heart disease), no prior stroke/TIA, no peripheral vascular disease, and no history of aortic aneurysm.        The patient states that she knows about the "Therapeutic Lifestyle Change" diet.  Her compliance with the TLC diet is fair.  The patient expresses understanding of adjunctive measures for cholesterol lowering.  Adjunctive measures started by the patient include fiber, ASA, limit alcohol consumpton, and weight reduction.  She expresses no side effects from her lipid-lowering medication.  The patient denies any symptoms to suggest myopathy or liver disease.     Preventive Screening-Counseling &  Management  Alcohol-Tobacco     Alcohol drinks/day: 0     Alcohol Counseling: not indicated; patient does not drink     Smoking Status: never     Tobacco Counseling: not indicated; no tobacco use  Hep-HIV-STD-Contraception     Hepatitis Risk: no risk noted     HIV Risk: no risk noted     STD Risk: no risk noted      Drug Use:  never.    Clinical Review Panels:  Prevention   Last Mammogram:  Normal Bilateral (09/11/2009)   Last Colonoscopy:  Diverticulosis (01/17/2002)  Immunizations   Last Tetanus Booster:  given (06/29/1994)   Last Flu Vaccine:  Fluvax MCR (03/12/2010)   Last Pneumovax:  Pneumovax (03/12/2010)  Lipid Management   Cholesterol:  151 (02/05/2010)   LDL (bad choesterol):  78 (02/05/2010)   HDL (good cholesterol):  54.50 (02/05/2010)   Triglycerides:  70 (07/12/2006)  Diabetes Management   HgBA1C:  6.2 (02/05/2010)   Creatinine:  0.6 (02/05/2010)   Last Dilated Eye Exam:  diabetic retinopathy (05/01/2009)   Last Foot Exam:  yes (05/27/2010)   Last Flu Vaccine:  Fluvax MCR (03/12/2010)   Last Pneumovax:  Pneumovax (03/12/2010)  CBC   WBC:  4.3 (02/05/2010)   RBC:  4.30 (02/05/2010)   Hgb:  12.6 (02/05/2010)   Hct:  37.6 (02/05/2010)   Platelets:  300.0 (02/05/2010)   MCV  87.4 (02/05/2010)   MCHC  33.5 (02/05/2010)   RDW  14.0 (02/05/2010)   PMN:  42.3 (02/05/2010)   Lymphs:  46.2 (02/05/2010)   Monos:  8.4 (02/05/2010)   Eosinophils:  2.7 (02/05/2010)   Basophil:  0.4 (02/05/2010)  Complete Metabolic Panel   Glucose:  100 (02/05/2010)   Sodium:  140 (02/05/2010)   Potassium:  3.9 (02/05/2010)   Chloride:  109 (02/05/2010)   CO2:  26 (02/05/2010)   BUN:  10 (02/05/2010)   Creatinine:  0.6 (02/05/2010)   Albumin:  3.6 (02/05/2010)   Total Protein:  6.8 (02/05/2010)   Calcium:  8.7 (02/05/2010)   Total Bili:  0.4 (02/05/2010)   Alk Phos:  58 (02/05/2010)   SGPT (ALT):  18 (02/05/2010)   SGOT (AST):  23 (02/05/2010)   Medications Prior  to Update: 1)  Amlodipine Besylate 10 Mg Tabs (Amlodipine Besylate) .... Take 1 Tablet By Mouth Once A Day 2)  Metformin Hcl 500 Mg Tb24 (Metformin Hcl) .... Take 1 Tablet By Mouth Once Daily 3)  Ecotrin Low Strength 81 Mg  Tbec (Aspirin) .Marland Kitchen.. 1 By Mouth Qd 4)  Furosemide 20 Mg Tabs (Furosemide) .Marland Kitchen.. 1 By Mouth Once Daily 5)  Klor-Con M20 20 Meq Cr-Tabs (Potassium Chloride Crys Cr) .Marland Kitchen.. 1 Three Times A Day 6)  High Potency D-1000 7)  Glucosamine 8)  Crestor 10 Mg Tabs (Rosuvastatin Calcium) .... One By Mouth Once Daily For Cholesterol 9)  Tamoxifen Citrate 20 Mg Tabs (Tamoxifen Citrate) .... One By Mouth Once Daily  Current Medications (verified): 1)  Amlodipine Besylate 10 Mg Tabs (Amlodipine Besylate) .... Take 1 Tablet By Mouth Once A Day 2)  Metformin Hcl 500 Mg Tb24 (Metformin Hcl) .... Take 1 Tablet By Mouth Once Daily 3)  Ecotrin Low Strength 81 Mg  Tbec (Aspirin) .Marland Kitchen.. 1 By Mouth Qd 4)  Furosemide 20 Mg Tabs (Furosemide) .Marland Kitchen.. 1 By Mouth Once Daily 5)  Klor-Con M20 20 Meq Cr-Tabs (Potassium Chloride Crys Cr) .Marland Kitchen.. 1 Three Times A Day 6)  High Potency D-1000 7)  Glucosamine 8)  Crestor 10 Mg Tabs (Rosuvastatin Calcium) .... One By Mouth Once Daily For Cholesterol 9)  Tamoxifen Citrate 20 Mg Tabs (Tamoxifen Citrate) .... One By Mouth Once Daily  Allergies (verified): 1)  ! Alendronate Sodium (Alendronate Sodium) 2)  Lisinopril  Past History:  Past Medical History: Last updated: 05/05/2007 Hypertension Osteoporosis Diabetes mellitus, type II Hyperlipidemia hx of pos PPD Diverticulosis, colon DJD left knee  Past Surgical History: Last updated: 05/05/2007 none  Family History: Last updated: 05/05/2007 Family History of CAD Female 1st degree relative Family History Diabetes 1st degree relative Family History of Stroke M 1st degree relative cervical cancer Lupus Family History Hypertension  Social History: Last updated: 04/02/2008 Never Smoked Alcohol  use-yes widow 4 children 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 Family History of CAD Female 1st degree relative Family History Diabetes 1st degree relative Family History of Stroke M 1st degree relative cervical cancer Lupus Family History Hypertension  Social History: Reviewed history from 04/02/2008 and no changes required. 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  The patient denies anorexia,  fever, weight loss, chest pain, syncope, dyspnea on exertion, peripheral edema, prolonged cough, headaches, hemoptysis, and abdominal pain.   Endo:  Denies cold intolerance, excessive hunger, excessive thirst, excessive urination, heat intolerance, polyuria, and weight change.  Physical Exam  General:  alert, well-developed, well-nourished, and well-hydrated.  overweight-appearing.   Head:  normocephalic and atraumatic.   Mouth:  Oral mucosa and oropharynx without lesions or exudates.  Teeth in good repair. Neck:  supple, full ROM, no masses, no carotid bruits, and no neck tenderness.   Lungs:  normal respiratory effort, no intercostal retractions, no accessory muscle use, normal breath sounds, no dullness, no fremitus, no crackles, and no wheezes.   Heart:  normal rate, regular rhythm, no murmur, no gallop, no rub, and no JVD.   Abdomen:  soft, non-tender, normal bowel sounds, no distention, no masses, no guarding, no rigidity, no rebound tenderness, no abdominal hernia, no inguinal hernia, no hepatomegaly, and no splenomegaly.   Msk:  normal ROM, no joint tenderness, no joint swelling, no joint warmth, no redness over joints, no joint deformities, no joint instability, and no  crepitation.   Pulses:  R and L carotid,radial,femoral,dorsalis pedis and posterior tibial pulses are full and equal bilaterally Extremities:  1+ left pedal edema and 1+ right pedal edema.   Neurologic:  No cranial nerve deficits noted. Station and gait are normal. Plantar reflexes are down-going bilaterally. DTRs are symmetrical throughout. Sensory, motor and coordinative functions appear intact. Skin:  turgor normal, color normal, no rashes, no suspicious lesions, no ecchymoses, no petechiae, no purpura, no ulcerations, and no edema.   Cervical Nodes:  no anterior cervical adenopathy and no posterior cervical adenopathy.   Axillary Nodes:  no R axillary adenopathy and no L axillary adenopathy.   Psych:  Cognition and judgment appear intact. Alert and cooperative with normal attention span and concentration. No apparent delusions, illusions, hallucinations  Diabetes Management Exam:    Foot Exam (with socks and/or shoes not present):       Sensory-Pinprick/Light touch:          Left medial foot (L-4): normal          Left dorsal foot (L-5): normal          Left lateral foot (S-1): normal          Right medial foot (L-4): normal          Right dorsal foot (L-5): normal          Right lateral foot (S-1): normal       Sensory-Monofilament:          Left foot: normal          Right foot: normal       Inspection:          Left foot: normal          Right foot: normal       Nails:          Left foot: normal          Right foot: normal   Impression & Recommendations:  Problem # 1:  HYPOKALEMIA, MILD (ICD-276.8) Assessment Unchanged  Orders: Venipuncture (16109) TLB-Lipid Panel (80061-LIPID) TLB-BMP (Basic Metabolic Panel-BMET) (80048-METABOL) TLB-CBC Platelet - w/Differential (85025-CBCD) TLB-Hepatic/Liver Function Pnl (80076-HEPATIC) TLB-TSH (Thyroid Stimulating Hormone) (84443-TSH) TLB-A1C / Hgb A1C (Glycohemoglobin) (83036-A1C) T-Vitamin D (25-Hydroxy) (60454-09811)  Problem  # 2:  HYPERLIPIDEMIA (ICD-272.4) Assessment: Unchanged  Her updated medication list for this problem includes:    Crestor 10 Mg Tabs (  Rosuvastatin calcium) ..... One by mouth once daily for cholesterol  Orders: Venipuncture (16109) TLB-Lipid Panel (80061-LIPID) TLB-BMP (Basic Metabolic Panel-BMET) (80048-METABOL) TLB-CBC Platelet - w/Differential (85025-CBCD) TLB-Hepatic/Liver Function Pnl (80076-HEPATIC) TLB-TSH (Thyroid Stimulating Hormone) (84443-TSH) TLB-A1C / Hgb A1C (Glycohemoglobin) (83036-A1C) T-Vitamin D (25-Hydroxy) [phone])  Labs Reviewed: SGOT: 23 (02/05/2010)   SGPT: 18 (02/05/2010)  Lipid Goals: Chol Goal: 200 (04/18/2009)   HDL Goal: 40 (04/18/2009)   LDL Goal: 100 (04/18/2009)   TG Goal: 150 (04/18/2009)  10 Yr Risk Heart Disease: 13 % Prior 10 Yr Risk Heart Disease: 17 % (02/13/2010)   HDL:54.50 (02/05/2010), 58.40 (07/17/2009)  LDL:78 (02/05/2010), 48 (07/17/2009)  Chol:151 (02/05/2010), 116 (07/17/2009)  Trig:91.0 (02/05/2010), 50.0 (07/17/2009)  Problem # 3:  DIABETES MELLITUS, TYPE II (ICD-250.00) Assessment: Unchanged  Her updated medication list for this problem includes:    Metformin Hcl 500 Mg Tb24 (Metformin hcl) .Marland Kitchen... Take 1 tablet by mouth once daily    Ecotrin Low Strength 81 Mg Tbec (Aspirin) .Marland Kitchen... 1 by mouth qd  Orders: Venipuncture (91478) TLB-Lipid Panel (80061-LIPID) TLB-BMP (Basic Metabolic Panel-BMET) (80048-METABOL) TLB-CBC Platelet - w/Differential (85025-CBCD) TLB-Hepatic/Liver Function Pnl (80076-HEPATIC) TLB-TSH (Thyroid Stimulating Hormone) (84443-TSH) TLB-A1C / Hgb A1C (Glycohemoglobin) (83036-A1C) T-Vitamin D (25-Hydroxy) (29562-13086) Ophthalmology Referral (Ophthalmology)  Labs Reviewed: Creat: 0.6 (02/05/2010)     Last Eye Exam: diabetic retinopathy (05/01/2009) Reviewed HgBA1c results: 6.2 (02/05/2010)  6.4 (10/17/2009)  Problem # 4:  OSTEOPOROSIS (ICD-733.00) Assessment: Unchanged  she does not want to treat  this, she refused to get the Reclast injection Orders: Venipuncture (57846) TLB-Lipid Panel (80061-LIPID) TLB-BMP (Basic Metabolic Panel-BMET) (80048-METABOL) TLB-CBC Platelet - w/Differential (85025-CBCD) TLB-Hepatic/Liver Function Pnl (80076-HEPATIC) TLB-TSH (Thyroid Stimulating Hormone) (84443-TSH) TLB-A1C / Hgb A1C (Glycohemoglobin) (83036-A1C) T-Vitamin D (25-Hydroxy) (96295-28413)  Discussed medication use, applications of heat or ice, and exercises.   Problem # 5:  HYPERTENSION (ICD-401.9) Assessment: Improved  Her updated medication list for this problem includes:    Amlodipine Besylate 10 Mg Tabs (Amlodipine besylate) .Marland Kitchen... Take 1 tablet by mouth once a day    Furosemide 20 Mg Tabs (Furosemide) .Marland Kitchen... 1 by mouth once daily  Orders: Venipuncture (24401) TLB-Lipid Panel (80061-LIPID) TLB-BMP (Basic Metabolic Panel-BMET) (80048-METABOL) TLB-CBC Platelet - w/Differential (85025-CBCD) TLB-Hepatic/Liver Function Pnl (80076-HEPATIC) TLB-TSH (Thyroid Stimulating Hormone) (84443-TSH) TLB-A1C / Hgb A1C (Glycohemoglobin) (83036-A1C) T-Vitamin D (25-Hydroxy) (02725-36644)  BP today: 130/82 Prior BP: 142/82 (02/13/2010)  10 Yr Risk Heart Disease: 13 % Prior 10 Yr Risk Heart Disease: 17 % (02/13/2010)  Labs Reviewed: K+: 3.9 (02/05/2010) Creat: : 0.6 (02/05/2010)   Chol: 151 (02/05/2010)   HDL: 54.50 (02/05/2010)   LDL: 78 (02/05/2010)   TG: 91.0 (02/05/2010)  Complete Medication List: 1)  Amlodipine Besylate 10 Mg Tabs (Amlodipine besylate) .... Take 1 tablet by mouth once a day 2)  Metformin Hcl 500 Mg Tb24 (Metformin hcl) .... Take 1 tablet by mouth once daily 3)  Ecotrin Low Strength 81 Mg Tbec (Aspirin) .Marland Kitchen.. 1 by mouth qd 4)  Furosemide 20 Mg Tabs (Furosemide) .Marland Kitchen.. 1 by mouth once daily 5)  Klor-con M20 20 Meq Cr-tabs (Potassium chloride crys cr) .Marland Kitchen.. 1 three times a day 6)  High Potency D-1000  7)  Glucosamine  8)  Crestor 10 Mg Tabs (Rosuvastatin calcium) .... One by  mouth once daily for cholesterol 9)  Tamoxifen Citrate 20 Mg Tabs (Tamoxifen citrate) .... One by mouth once daily  Lipid Assessment/Plan:      Based on NCEP/ATP III, the patient's risk factor category is "history of diabetes".  The patient's  lipid goals are as follows: Total cholesterol goal is 200; LDL cholesterol goal is 100; HDL cholesterol goal is 40; Triglyceride goal is 150.    Patient Instructions: 1)  Please schedule a follow-up appointment in 4 months. 2)  It is important that you exercise regularly at least 20 minutes 5 times a week. If you develop chest pain, have severe difficulty breathing, or feel very tired , stop exercising immediately and seek medical attention. 3)  You need to lose weight. Consider a lower calorie diet and regular exercise.  4)  Check your blood sugars regularly. If your readings are usually above 200 or below 70 you should contact our office. 5)  It is important that your Diabetic A1c level is checked every 3 months. 6)  See your eye doctor yearly to check for diabetic eye damage. 7)  Check your feet each night for sore areas, calluses or signs of infection. 8)  Check your Blood Pressure regularly. If it is above 130/80: you should make an appointment.   Orders Added: 1)  Venipuncture [36415] 2)  TLB-Lipid Panel [80061-LIPID] 3)  TLB-BMP (Basic Metabolic Panel-BMET) [80048-METABOL] 4)  TLB-CBC Platelet - w/Differential [85025-CBCD] 5)  TLB-Hepatic/Liver Function Pnl [80076-HEPATIC] 6)  TLB-TSH (Thyroid Stimulating Hormone) [84443-TSH] 7)  TLB-A1C / Hgb A1C (Glycohemoglobin) [83036-A1C] 8)  T-Vitamin D (25-Hydroxy) [16109-60454] 9)  Ophthalmology Referral [Ophthalmology] 10)  Est. Patient Level IV [09811]   Immunization History:  Influenza Immunization History:    Influenza:  fluvax mcr (03/12/2010)  Pneumovax Immunization History:    Pneumovax:  pneumovax (03/12/2010)   Immunization History:  Influenza Immunization History:    Influenza:   Fluvax MCR (03/12/2010)  Pneumovax Immunization History:    Pneumovax:  Pneumovax (03/12/2010)    Prevention & Chronic Care Immunizations   Influenza vaccine: Fluvax MCR  (03/12/2010)    Tetanus booster: 06/29/1994: given    Pneumococcal vaccine: Pneumovax  (03/12/2010)   Pneumococcal vaccine due: 04/2010    H. zoster vaccine: Not documented   H. zoster vaccine deferral: Refused  (05/27/2010)  Colorectal Screening   Hemoccult: Not documented   Hemoccult action/deferral: patient refused  (02/05/2010)    Colonoscopy: Diverticulosis  (01/17/2002)   Colonoscopy action/deferral: patient refused  (02/05/2010)   Colonoscopy due: 01/2007  Other Screening   Pap smear: Not documented   Pap smear action/deferral: patient defers to GYN provider  (02/05/2010)    Mammogram: Normal Bilateral  (09/11/2009)   Mammogram due: 06/2008    DXA bone density scan: abnormal  (08/09/2006)   DXA scan due: 07/2008    Smoking status: never  (05/27/2010)  Diabetes Mellitus   HgbA1C: 6.2  (02/05/2010)    Eye exam: diabetic retinopathy  (05/01/2009)   Eye exam due: 04/2010    Foot exam: yes  (05/27/2010)   High risk foot: Not documented   Foot care education: Not documented    Urine microalbumin/creatinine ratio: 4.1  (04/18/2009)  Lipids   Total Cholesterol: 151  (02/05/2010)   LDL: 78  (02/05/2010)   LDL Direct: 119.8  (04/02/2008)   HDL: 54.50  (02/05/2010)   Triglycerides: 91.0  (02/05/2010)    SGOT (AST): 23  (02/05/2010)   SGPT (ALT): 18  (02/05/2010)   Alkaline phosphatase: 58  (02/05/2010)   Total bilirubin: 0.4  (02/05/2010)  Hypertension   Last Blood Pressure: 130 / 82  (05/27/2010)   Serum creatinine: 0.6  (02/05/2010)   Serum potassium 3.9  (02/05/2010)  Self-Management Support :    Diabetes self-management support: Not documented  Hypertension self-management support: Not documented    Lipid self-management support: Not documented    Nursing  Instructions: Give tetanus booster today

## 2010-07-31 NOTE — Assessment & Plan Note (Signed)
Summary: 3 mos f/u per pt/#/cd   Vital Signs:  Patient profile:   77 year old [name] Height:      59 inches Weight:      188.50 pounds BMI:     38.21 O2 Sat:      93 % on Room air Temp:     97.6 degrees F oral Pulse rate:   78 / minute Pulse rhythm:   regular Resp:     16 per minute BP sitting:   138 / 78  (left arm) Cuff size:   large  Vitals Entered By: Rock Nephew CMA (February 05, 2010 8:24 AM)  Nutrition Counseling: Patient's BMI is greater than 25 and therefore counseled on weight management options.  O2 Flow:  Room air CC: follow-up visit.//34mos, Preventive Care Is Patient Diabetic? Yes Did you bring your meter with you today? No Pain Assessment Patient in pain? no       Does patient need assistance? Functional Status Self care Ambulation Normal   Primary Care Provider:  Etta Grandchild MD  CC:  follow-up visit.//36mos and Preventive Care.  History of Present Illness:  Follow-Up Visit      This is a 77 year old woman who presents for Follow-up visit.  The patient denies chest pain, palpitations, dizziness, syncope, edema, SOB, DOE, PND, and orthopnea.  Since the last visit the patient notes problems with medications.  The patient reports taking meds as prescribed, monitoring BP, monitoring blood sugars, and dietary compliance.  When questioned about possible medication side effects, the patient notes cramping and GI upset.    Preventive Screening-Counseling & Management  Alcohol-Tobacco     Alcohol drinks/day: 0     Smoking Status: never  Caffeine-Diet-Exercise     Does Patient Exercise: yes     Type of exercise: walking     Exercise (avg: min/session): 30-60     Times/week: 4     Exercise Counseling: to improve exercise regimen  Hep-HIV-STD-Contraception     Hepatitis Risk: no risk noted     HIV Risk: no risk noted     STD Risk: no risk noted      Drug Use:  never.    Current Medications (verified): 1)  Amlodipine Besylate 10 Mg Tabs  (Amlodipine Besylate) .... Take 1 Tablet By Mouth Once A Day 2)  Metformin Hcl 500 Mg Tb24 (Metformin Hcl) .... Take 1 Tablet By Mouth Once Daily 3)  Ecotrin Low Strength 81 Mg  Tbec (Aspirin) .Marland Kitchen.. 1 By Mouth Qd 4)  Furosemide 20 Mg Tabs (Furosemide) .Marland Kitchen.. 1 By Mouth Once Daily 5)  Klor-Con M20 20 Meq Cr-Tabs (Potassium Chloride Crys Cr) .Marland Kitchen.. 1 Three Times A Day 6)  High Potency D-1000 7)  Glucosamine 8)  Atelvia 35 Mg Tbec (Risedronate Sodium) .... One By Mouth Once A Week For Osteoporosis  Allergies (verified): 1)  ! Alendronate Sodium (Alendronate Sodium) 2)  Lisinopril  Past History:  Past Medical History: Last updated: 05/05/2007 Hypertension Osteoporosis Diabetes mellitus, type II Hyperlipidemia hx of pos PPD Diverticulosis, colon DJD left knee  Past Surgical History: Last updated: 05/05/2007 none  Family History: Last updated: 05/05/2007 Family History of CAD [name] 1st degree relative Family History Diabetes 1st degree relative Family History of Stroke M 1st degree relative cervical cancer Lupus Family History Hypertension  Social History: Last updated: 04/02/2008 Never Smoked Alcohol use-yes widow 4 children 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 Family History of CAD [name] 1st degree relative Family History Diabetes 1st degree relative Family History of Stroke M 1st degree relative cervical cancer Lupus Family History Hypertension  Social History: Reviewed history from 04/02/2008 and no changes required. 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  The patient denies anorexia, fever, weight loss, syncope, dyspnea on exertion, peripheral edema, prolonged cough, headaches, hemoptysis, abdominal pain, melena, hematochezia, severe indigestion/heartburn, hematuria, muscle weakness, suspicious skin lesions, difficulty walking, and depression.   MS:  Complains of muscle aches, cramps, and stiffness; denies joint pain, joint redness, joint swelling, loss of strength, low back pain, mid back pain, and thoracic pain.  Physical Exam  General:  alert, well-developed, well-nourished, and well-hydrated.  overweight-appearing.   Head:  normocephalic and atraumatic.   Mouth:  Oral mucosa and oropharynx without lesions or exudates.  Teeth in good repair. Neck:  supple, full ROM, no masses, no carotid bruits, and no neck tenderness.   Lungs:  normal respiratory effort, no intercostal retractions, no accessory muscle use, normal breath sounds, no dullness, no fremitus, no crackles, and no wheezes.   Heart:  normal rate, regular rhythm, no murmur, no gallop, no rub, and no JVD.   Abdomen:  soft, non-tender, normal bowel sounds, no distention, no masses, no guarding, no rigidity, no rebound tenderness, no abdominal hernia, no inguinal hernia, no hepatomegaly, and no splenomegaly.   Msk:  No deformity or scoliosis noted of thoracic or lumbar spine.   Pulses:  R and L carotid,radial,femoral,dorsalis pedis and posterior tibial pulses are full and equal bilaterally Extremities:  No clubbing, cyanosis, edema, or deformity noted with normal full range of motion of all joints.   Neurologic:  No cranial nerve deficits noted. Station and gait are normal. Plantar reflexes are down-going bilaterally. DTRs are symmetrical throughout. Sensory, motor and coordinative functions appear intact. Skin:  Intact without suspicious lesions or rashes Cervical Nodes:  no anterior cervical adenopathy and no posterior cervical adenopathy.   Axillary  Nodes:  no R axillary adenopathy and no L axillary adenopathy.   Psych:  Cognition and judgment appear intact. Alert and cooperative with normal attention span and concentration. No apparent delusions, illusions, hallucinations  Diabetes Management Exam:    Foot Exam (with socks and/or shoes not present):       Sensory-Pinprick/Light touch:          Left medial foot (L-4): normal          Left dorsal foot (L-5): normal          Left lateral foot (S-1): normal          Right medial foot (L-4): normal          Right dorsal foot (L-5): normal          Right lateral foot (S-1): normal       Sensory-Monofilament:          Left foot: normal          Right foot: normal       Inspection:          Left foot: normal  Right foot: normal       Nails:          Left foot: normal          Right foot: normal   Impression & Recommendations:  Problem # 1:  OSTEOPOROSIS (ICD-733.00) Assessment Unchanged she does not tolerate oral meds so I will set her up for reclast The following medications were removed from the medication list:    Atelvia 35 Mg Tbec (Risedronate sodium) ..... One by mouth once a week for osteoporosis  Orders: Venipuncture (75643) TLB-Lipid Panel (80061-LIPID) TLB-BMP (Basic Metabolic Panel-BMET) (80048-METABOL) TLB-CBC Platelet - w/Differential (85025-CBCD) TLB-Hepatic/Liver Function Pnl (80076-HEPATIC) TLB-TSH (Thyroid Stimulating Hormone) (84443-TSH) TLB-A1C / Hgb A1C (Glycohemoglobin) (83036-A1C) TLB-Udip w/ Micro (81001-URINE) T-Vitamin D (25-Hydroxy) (32951-88416)  Problem # 2:  HYPOKALEMIA, MILD (ICD-276.8) Assessment: Unchanged  Orders: Venipuncture (60630) TLB-Lipid Panel (80061-LIPID) TLB-BMP (Basic Metabolic Panel-BMET) (80048-METABOL) TLB-CBC Platelet - w/Differential (85025-CBCD) TLB-Hepatic/Liver Function Pnl (80076-HEPATIC) TLB-TSH (Thyroid Stimulating Hormone) (84443-TSH) TLB-A1C / Hgb A1C (Glycohemoglobin) (83036-A1C) TLB-Udip w/ Micro  (81001-URINE) T-Vitamin D (25-Hydroxy) (16010-93235)  Problem # 3:  HYPERLIPIDEMIA (ICD-272.4) Assessment: Deteriorated  The following medications were removed from the medication list:    Simvastatin 40 Mg Tabs (Simvastatin) .Marland Kitchen... 1 by mouth once daily Her updated medication list for this problem includes:    Crestor 10 Mg Tabs (Rosuvastatin calcium) ..... One by mouth once daily for cholesterol  Orders: Venipuncture (57322) TLB-Lipid Panel (80061-LIPID) TLB-BMP (Basic Metabolic Panel-BMET) (80048-METABOL) TLB-CBC Platelet - w/Differential (85025-CBCD) TLB-Hepatic/Liver Function Pnl (80076-HEPATIC) TLB-TSH (Thyroid Stimulating Hormone) (84443-TSH) TLB-A1C / Hgb A1C (Glycohemoglobin) (83036-A1C) TLB-Udip w/ Micro (81001-URINE) T-Vitamin D (25-Hydroxy) (02542-70623)  Labs Reviewed: SGOT: 24 (07/17/2009)   SGPT: 21 (07/17/2009)  Lipid Goals: Chol Goal: 200 (04/18/2009)   HDL Goal: 40 (04/18/2009)   LDL Goal: 100 (04/18/2009)   TG Goal: 150 (04/18/2009)  Prior 10 Yr Risk Heart Disease: 13 % (07/17/2009)   HDL:58.40 (07/17/2009), 58.00 (04/18/2009)  LDL:48 (07/17/2009), 63 (04/18/2009)  Chol:116 (07/17/2009), 134 (04/18/2009)  Trig:50.0 (07/17/2009), 67.0 (04/18/2009)  Problem # 4:  DIABETES MELLITUS, TYPE II (ICD-250.00) Assessment: Unchanged  Her updated medication list for this problem includes:    Metformin Hcl 500 Mg Tb24 (Metformin hcl) .Marland Kitchen... Take 1 tablet by mouth once daily    Ecotrin Low Strength 81 Mg Tbec (Aspirin) .Marland Kitchen... 1 by mouth qd  Orders: Venipuncture (76283) TLB-Lipid Panel (80061-LIPID) TLB-BMP (Basic Metabolic Panel-BMET) (80048-METABOL) TLB-CBC Platelet - w/Differential (85025-CBCD) TLB-Hepatic/Liver Function Pnl (80076-HEPATIC) TLB-TSH (Thyroid Stimulating Hormone) (84443-TSH) TLB-A1C / Hgb A1C (Glycohemoglobin) (83036-A1C) TLB-Udip w/ Micro (81001-URINE) T-Vitamin D (25-Hydroxy) (15176-16073)  Labs Reviewed: Creat: 0.7 (10/17/2009)     Last Eye  Exam: diabetic retinopathy (05/01/2009) Reviewed HgBA1c results: 6.4 (10/17/2009)  6.4 (07/17/2009)  Problem # 5:  HYPERTENSION (ICD-401.9) Assessment: Unchanged  The following medications were removed from the medication list:    Hydrochlorothiazide 25 Mg Tabs (Hydrochlorothiazide) .Marland Kitchen... Take 1 tab by mouth every morning Her updated medication list for this problem includes:    Amlodipine Besylate 10 Mg Tabs (Amlodipine besylate) .Marland Kitchen... Take 1 tablet by mouth once a day    Furosemide 20 Mg Tabs (Furosemide) .Marland Kitchen... 1 by mouth once daily  Orders: Venipuncture (71062) TLB-Lipid Panel (80061-LIPID) TLB-BMP (Basic Metabolic Panel-BMET) (80048-METABOL) TLB-CBC Platelet - w/Differential (85025-CBCD) TLB-Hepatic/Liver Function Pnl (80076-HEPATIC) TLB-TSH (Thyroid Stimulating Hormone) (84443-TSH) TLB-A1C / Hgb A1C (Glycohemoglobin) (83036-A1C) TLB-Udip w/ Micro (81001-URINE) T-Vitamin D (25-Hydroxy) (69485-46270)  BP today: 138/78 Prior BP: 132/84 (10/17/2009)  Prior 10 Yr Risk Heart Disease: 13 % (07/17/2009)  Labs  Reviewed: K+: 3.8 (10/17/2009) Creat: : 0.7 (10/17/2009)   Chol: 116 (07/17/2009)   HDL: 58.40 (07/17/2009)   LDL: 48 (07/17/2009)   TG: 50.0 (07/17/2009)  Complete Medication List: 1)  Amlodipine Besylate 10 Mg Tabs (Amlodipine besylate) .... Take 1 tablet by mouth once a day 2)  Metformin Hcl 500 Mg Tb24 (Metformin hcl) .... Take 1 tablet by mouth once daily 3)  Ecotrin Low Strength 81 Mg Tbec (Aspirin) .Marland Kitchen.. 1 by mouth qd 4)  Furosemide 20 Mg Tabs (Furosemide) .Marland Kitchen.. 1 by mouth once daily 5)  Klor-con M20 20 Meq Cr-tabs (Potassium chloride crys cr) .Marland Kitchen.. 1 three times a day 6)  High Potency D-1000  7)  Glucosamine  8)  Crestor 10 Mg Tabs (Rosuvastatin calcium) .... One by mouth once daily for cholesterol 9)  Tamoxifen Citrate 20 Mg Tabs (Tamoxifen citrate) .... One by mouth once daily  Colorectal Screening:  Current Recommendations:    Hemoccult: patient refused     Colonoscopy recommended: patient refused  PAP Screening:    Reviewed PAP smear recommendations:  patient defers to GYN provider  Mammogram Screening:    Last Mammogram:  09/11/2009  Mammogram Results:    Date of Exam:  09/11/2009    Results:  Normal Bilateral  Osteoporosis Risk Assessment:  Risk Factors for Fracture or Low Bone Density:   Smoking status:       never  Immunization & Chemoprophylaxis:    Tetanus vaccine: given  (06/29/1994)    Pneumovax: given  (04/30/2005)  Patient Instructions: 1)  Please schedule a follow-up appointment in 3 months. 2)  It is important that you exercise regularly at least 20 minutes 5 times a week. If you develop chest pain, have severe difficulty breathing, or feel very tired , stop exercising immediately and seek medical attention. 3)  You need to lose weight. Consider a lower calorie diet and regular exercise.  4)  Schedule your mammogram. 5)  Take calcium +Vitamin D daily. 6)  Check your blood sugars regularly. If your readings are usually above 200 or below 70 you should contact our office. 7)  It is important that your Diabetic A1c level is checked every 3 months. 8)  See your eye doctor yearly to check for diabetic eye damage. 9)  Check your feet each night for sore areas, calluses or signs of infection. 10)  Check your Blood Pressure regularly. If it is above 130/80: you should make an appointment. Prescriptions: CRESTOR 10 MG TABS (ROSUVASTATIN CALCIUM) One by mouth once daily for cholesterol  #126 x 0   Entered and Authorized by:   Etta Grandchild MD   Signed by:   Etta Grandchild MD on 02/05/2010   Method used:   Samples Given   RxID:   [id number]    Not Administered:    Influenza Vaccine not given due to: vaccine availability

## 2010-07-31 NOTE — Progress Notes (Signed)
  Phone Note Outgoing Call   Summary of Call: LA: please let her know that her urine culture was positive so I have sent an Rx for antibiotics to her pharmacy. - please fax the Rx to [facility] Initial call taken by: Etta Grandchild MD,  February 17, 2010 7:36 AM    New/Updated Medications: AMPICILLIN 500 MG CAPS (AMPICILLIN) One by mouth QID for 7 days Prescriptions: AMPICILLIN 500 MG CAPS (AMPICILLIN) One by mouth QID for 7 days  #28 x 0   Entered and Authorized by:   Etta Grandchild MD   Signed by:   Etta Grandchild MD on 02/17/2010   Method used:   Printed then faxed to ...       [facility] (retail)       [address]       [place], [place]  [address]       Ph: [phone]       Fax: [phone]   RxID:   [id number]   Appended Document:  Patient notified

## 2010-07-31 NOTE — Progress Notes (Signed)
     Diabetes Management Exam:    Eye Exam:       Eye Exam done elsewhere          Date: 07/17/2010          Results: normal          Done by: lyles

## 2010-07-31 NOTE — Letter (Signed)
Summary: [facility]  [facility]   Imported By: Lester [place] 03/07/2010 11:15:03  _____________________________________________________________________  External Attachment:    Type:   Image     Comment:   External Document

## 2010-07-31 NOTE — Progress Notes (Signed)
Summary: [facility]  Phone Note From Other Clinic   Summary of Call: [facility] called. Pt is due for Reclast and they need forms completed. Ok for reclast? Pt will need labs prior.  Initial call taken by: Lamar Sprinkles, CMA,  March 11, 2010 3:26 PM  Follow-up for Phone Call        yes Follow-up by: Etta Grandchild MD,  March 11, 2010 6:43 PM  Additional Follow-up for Phone Call Additional follow up Details #1::        sarah have you seen any forms faxed for reclast. IF so they need to be faxed back to [phone] Additional Follow-up by: Ami Bullins CMA,  March 14, 2010 2:58 PM    Additional Follow-up for Phone Call Additional follow up Details #2::    Unsure where original forms are. [facility] and you have called. They will contact patient for labs. Forms pending labs and then md's signature - they will then be faxed to [facility] & you for completion of scheduling. ...........Marland KitchenLamar Sprinkles, CMA  March 27, 2010 4:19 PM   Spoke w/[facility]cl[facility] & you again, they will continue to attempt to contact patient. There is pending lab order. Once complete we will fax forms to reclast and you...................Marland KitchenLamar Sprinkles, CMA  April 10, 2010 4:32 PM   Pt has not come in for labs as required for reclast. Please order BMP at pt's f/u office visit on 11/29 and we will complete reclast process if labs are w/in normal range. Thank you................Marland KitchenLamar Sprinkles, CMA  May 09, 2010 6:15 PM

## 2010-07-31 NOTE — Progress Notes (Signed)
  Phone Note From Pharmacy   Caller: [facility] Initial call taken by: Rock Nephew CMA,  April 21, 2010 8:46 AM    Prescriptions: METFORMIN HCL 500 MG TB24 (METFORMIN HCL) Take 1 tablet by mouth once daily  #90 x 3   Entered by:   Rock Nephew CMA   Authorized by:   Etta Grandchild MD   Signed by:   Rock Nephew CMA on 04/21/2010   Method used:   Faxed to ...       [facility] (retail)       [address]       [place], [place]  [address]       Ph: [phone]       Fax: [phone]   RxID:   [id number]

## 2010-07-31 NOTE — Letter (Signed)
[facility]   Imported By: Lester [place] 08/06/2009 09:51:53  _____________________________________________________________________  External Attachment:    Type:   Image     Comment:   External Document

## 2010-07-31 NOTE — Progress Notes (Signed)
Summary: ?  Phone Note Call from Patient   Summary of Call: 1.FYI, If [facility] requests refills for pt she does not want to use them.   2. Pt says she is a new pt of Dr Yetta Barre, Is this correct? if yes, I will update EMR.  Initial call taken by: Lamar Sprinkles, CMA,  July 26, 2009 10:37 AM  Follow-up for Phone Call        1. ok  2. yes Follow-up by: Etta Grandchild MD,  July 26, 2009 10:46 AM

## 2010-07-31 NOTE — Letter (Signed)
Summary: Results Follow-up Letter  [facility]  [address] [place], [place] [address]   Phone: [phone]  Fax: [phone]    07/17/2009  [address] [place], [place]  [address]  Dear Ms. SCHECK,   The following are the results of your recent test(s):  Test     Result     Potassium     still a little low Kidney/liver   normal Urine       trace evidence of infection A1C= 6.4     good average blood sugar  _________________________________________________________  Please call for an appointment in 2-3 months, sooner if needed _________________________________________________________ _________________________________________________________ _________________________________________________________  Sincerely,  Sanda Linger MD [facility]

## 2010-07-31 NOTE — Assessment & Plan Note (Signed)
Summary: f/u appt per pt/#/cd   Vital Signs:  Patient profile:   77 year old female Height:      59 inches Weight:      188 pounds BMI:     38.11 O2 Sat:      92 % on Room air Temp:     97.6 degrees F oral Pulse rate:   89 / minute Pulse rhythm:   regular Resp:     16 per minute BP sitting:   134 / 80  (left arm) Cuff size:   large  Vitals Entered By: Rock Nephew CMA (July 17, 2009 8:50 AM)  Nutrition Counseling: Patient's BMI is greater than 25 and therefore counseled on weight management options.  O2 Flow:  Room air CC: follow-up visit, Lipid Management   Primary Care Provider:  Corwin Levins MD  CC:  follow-up visit and Lipid Management.  History of Present Illness:  Hypertension Follow-Up      This is a 77 year old woman who presents for Hypertension follow-up.  The patient denies lightheadedness, urinary frequency, headaches, edema, and fatigue.  The patient denies the following associated symptoms: chest pain, chest pressure, exercise intolerance, dyspnea, palpitations, syncope, leg edema, and pedal edema.  Compliance with medications (by patient report) has been near 100%.  The patient reports that dietary compliance has been good.  The patient reports exercising occasionally.  Adjunctive measures currently used by the patient include salt restriction and relaxation.    She has nausea for 1-2 days after taking Alendronate so she wants to stop taking it.  Lipid Management History:      Positive NCEP/ATP III risk factors include female age 75 years old or older, diabetes, and hypertension.  Negative NCEP/ATP III risk factors include no family history for ischemic heart disease, non-tobacco-user status, no ASHD (atherosclerotic heart disease), no prior stroke/TIA, no peripheral vascular disease, and no history of aortic aneurysm.        The patient states that she knows about the "Therapeutic Lifestyle Change" diet.  Her compliance with the TLC diet is fair.  The  patient expresses understanding of adjunctive measures for cholesterol lowering.  Adjunctive measures started by the patient include fiber, limit alcohol consumpton, and weight reduction.  She expresses no side effects from her lipid-lowering medication.  The patient denies any symptoms to suggest myopathy or liver disease.     Preventive Screening-Counseling & Management  Alcohol-Tobacco     Alcohol drinks/day: 0     Smoking Status: never  Allergies: 1)  Lisinopril  Past History:  Past Medical History: Reviewed history from 05/05/2007 and no changes required. Hypertension Osteoporosis Diabetes mellitus, type II Hyperlipidemia hx of pos PPD Diverticulosis, colon DJD left knee  Past Surgical History: Reviewed history from 05/05/2007 and no changes required. none  Family History: Reviewed history from 05/05/2007 and no changes required. Family History of CAD Female 1st degree relative Family History Diabetes 1st degree relative Family History of Stroke M 1st degree relative cervical cancer Lupus Family History Hypertension  Social History: Reviewed history from 04/02/2008 and no changes required. 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   GI:  Complains of nausea; denies abdominal pain, bloody stools, change in bowel habits, dark tarry stools, diarrhea, indigestion, loss of appetite, vomiting, vomiting blood, and yellowish skin  color. Endo:  Denies cold intolerance, excessive hunger, excessive thirst, excessive urination, polyuria, and weight change.  Physical Exam  General:  alert, well-developed, well-nourished, and well-hydrated.   Mouth:  Oral mucosa and oropharynx without lesions or exudates.  Teeth in good repair. Neck:  supple, full ROM,  no masses, no carotid bruits, and no neck tenderness.   Lungs:  Normal respiratory effort, chest expands symmetrically. Lungs are clear to auscultation, no crackles or wheezes. Heart:  Normal rate and regular rhythm. S1 and S2 normal without gallop, murmur, click, rub or other extra sounds. Abdomen:  soft, non-tender, normal bowel sounds, no distention, no masses, no guarding, no rigidity, no hepatomegaly, and no splenomegaly.   Msk:  normal ROM, no joint tenderness, no joint swelling, and no joint warmth.   Pulses:  R and L carotid,radial,femoral,dorsalis pedis and posterior tibial pulses are full and equal bilaterally Extremities:  No clubbing, cyanosis, edema, or deformity noted with normal full range of motion of all joints.   Neurologic:  No cranial nerve deficits noted. Station and gait are normal. Plantar reflexes are down-going bilaterally. DTRs are symmetrical throughout. Sensory, motor and coordinative functions appear intact. Skin:  Intact without suspicious lesions or rashes Cervical Nodes:  No lymphadenopathy noted Psych:  Cognition and judgment appear intact. Alert and cooperative with normal attention span and concentration. No apparent delusions, illusions, hallucinations  Diabetes Management Exam:    Foot Exam (with socks and/or shoes not present):       Sensory-Pinprick/Light touch:          Left medial foot (L-4): normal          Left dorsal foot (L-5): normal          Left lateral foot (S-1): normal          Right medial foot (L-4): normal          Right dorsal foot (L-5): normal          Right lateral foot (S-1): normal       Sensory-Monofilament:          Left foot: normal          Right foot: normal       Inspection:          Left foot: normal          Right foot: normal       Nails:          Left foot: normal          Right foot: normal   Impression & Recommendations:  Problem # 1:  HYPERLIPIDEMIA (ICD-272.4) Assessment Improved  Her updated medication  list for this problem includes:    Simvastatin 40 Mg Tabs (Simvastatin) .Marland Kitchen... 1 by mouth once daily  Orders: Venipuncture (54098) TLB-Lipid Panel (80061-LIPID) TLB-BMP (Basic Metabolic Panel-BMET) (80048-METABOL) TLB-Hepatic/Liver Function Pnl (80076-HEPATIC) TLB-Udip w/ Micro (81001-URINE) TLB-A1C / Hgb A1C (Glycohemoglobin) (83036-A1C)  Labs Reviewed: SGOT: 22 (04/18/2009)   SGPT: 16 (04/18/2009)  Lipid Goals: Chol Goal: 200 (04/18/2009)   HDL Goal: 40 (04/18/2009)   LDL Goal: 100 (04/18/2009)   TG Goal: 150 (04/18/2009)  10 Yr Risk Heart Disease: 13 % Prior 10 Yr Risk Heart Disease: Not enough information (04/18/2009)   HDL:58.00 (04/18/2009), 59.0 (04/02/2008)  LDL:63 (04/18/2009), DEL (11/91/4782)  Chol:134 (04/18/2009), 203 (04/02/2008)  Trig:67.0 (04/18/2009), 60 (04/02/2008)  Problem # 2:  DIABETES MELLITUS, TYPE II (ICD-250.00) Assessment: Unchanged  Her updated medication list for this problem includes:    Metformin Hcl  500 Mg Tb24 (Metformin hcl) .Marland Kitchen... Take 1 tablet by mouth once daily    Ecotrin Low Strength 81 Mg Tbec (Aspirin) .Marland Kitchen... 1 by mouth qd  Orders: Venipuncture (45409) TLB-Lipid Panel (80061-LIPID) TLB-BMP (Basic Metabolic Panel-BMET) (80048-METABOL) TLB-Hepatic/Liver Function Pnl (80076-HEPATIC) TLB-Udip w/ Micro (81001-URINE) TLB-A1C / Hgb A1C (Glycohemoglobin) (83036-A1C)  Labs Reviewed: Creat: 0.9 (04/18/2009)     Last Eye Exam: diabetic retinopathy (05/01/2009) Reviewed HgBA1c results: 6.4 (04/18/2009)  6.5 (04/02/2008)  Problem # 3:  OSTEOPOROSIS (ICD-733.00) Assessment: Unchanged  The following medications were removed from the medication list:    Alendronate Sodium 70 Mg Tabs (Alendronate sodium) .Marland Kitchen... 1 by mouth 1 wk  Discussed medication use  and exercises.   Problem # 4:  HYPERTENSION (ICD-401.9) Assessment: Improved  Her updated medication list for this problem includes:    Amlodipine Besylate 10 Mg Tabs (Amlodipine besylate)  .Marland Kitchen... Take 1 tablet by mouth once a day    Hydrochlorothiazide 25 Mg Tabs (Hydrochlorothiazide) .Marland Kitchen... Take 1 tab by mouth every morning    Furosemide 20 Mg Tabs (Furosemide) .Marland Kitchen... 1 by mouth once daily  Orders: Venipuncture (81191) TLB-Lipid Panel (80061-LIPID) TLB-BMP (Basic Metabolic Panel-BMET) (80048-METABOL) TLB-Hepatic/Liver Function Pnl (80076-HEPATIC) TLB-Udip w/ Micro (81001-URINE) TLB-A1C / Hgb A1C (Glycohemoglobin) (83036-A1C)  BP today: 134/80 Prior BP: 130/82 (04/18/2009)  10 Yr Risk Heart Disease: 13 % Prior 10 Yr Risk Heart Disease: Not enough information (04/18/2009)  Labs Reviewed: K+: 3.3 (04/18/2009) Creat: : 0.9 (04/18/2009)   Chol: 134 (04/18/2009)   HDL: 58.00 (04/18/2009)   LDL: 63 (04/18/2009)   TG: 67.0 (04/18/2009)  Complete Medication List: 1)  Amlodipine Besylate 10 Mg Tabs (Amlodipine besylate) .... Take 1 tablet by mouth once a day 2)  Metformin Hcl 500 Mg Tb24 (Metformin hcl) .... Take 1 tablet by mouth once daily 3)  Ecotrin Low Strength 81 Mg Tbec (Aspirin) .Marland Kitchen.. 1 by mouth qd 4)  Hydrochlorothiazide 25 Mg Tabs (Hydrochlorothiazide) .... Take 1 tab by mouth every morning 5)  Furosemide 20 Mg Tabs (Furosemide) .Marland Kitchen.. 1 by mouth once daily 6)  Simvastatin 40 Mg Tabs (Simvastatin) .Marland Kitchen.. 1 by mouth once daily 7)  Klor-con M20 20 Meq Cr-tabs (Potassium chloride crys cr) .Marland Kitchen.. 1 three times a day 8)  High Potency D-1000  9)  Glucosamine  10)  Tamoxifen Citrate 20 Mg Tabs (Tamoxifen citrate)  Lipid Assessment/Plan:      Based on NCEP/ATP III, the patient's risk factor category is "history of diabetes".  The patient's lipid goals are as follows: Total cholesterol goal is 200; LDL cholesterol goal is 100; HDL cholesterol goal is 40; Triglyceride goal is 150.    Patient Instructions: 1)  Please schedule a follow-up appointment in 3 months. 2)  It is important that you exercise regularly at least 20 minutes 5 times a week. If you develop chest pain, have severe  difficulty breathing, or feel very tired , stop exercising immediately and seek medical attention. 3)  You need to lose weight. Consider a lower calorie diet and regular exercise.  4)  Check your blood sugars regularly. If your readings are usually above 200 or below 70 you should contact our office. 5)  It is important that your Diabetic A1c level is checked every 3 months. 6)  See your eye doctor yearly to check for diabetic eye damage. 7)  Check your feet each night for sore areas, calluses or signs of infection. 8)  Check your Blood Pressure regularly. If it is above 130/80: you should make an  appointment.

## 2010-07-31 NOTE — Progress Notes (Signed)
  Phone Note Refill Request Message from:  Fax from Pharmacy on Nov 22, 2009 1:43 PM  Refills Requested: Medication #1:  FUROSEMIDE 20 MG TABS 1 by mouth once daily Initial call taken by: Rock Nephew CMA,  Nov 22, 2009 1:43 PM    Prescriptions: FUROSEMIDE 20 MG TABS (FUROSEMIDE) 1 by mouth once daily  #90 x 3   Entered by:   Rock Nephew CMA   Authorized by:   Etta Grandchild MD   Signed by:   Rock Nephew CMA on 11/22/2009   Method used:   Faxed to ...       [facility] (retail)       [address]       [place], [place]  [address]       Ph: [phone]       Fax: [phone]   RxID:   [id number]

## 2010-07-31 NOTE — Letter (Signed)
Summary: Lipid Letter  [facility]  [address] [place], [place] [address]   Phone: [phone]  Fax: [phone]    05/27/2010  Brittany [name] [address] [place], [place]  [address]  Dear Ms. Brittany [name]:  We have carefully reviewed your last lipid profile from 05/27/2010 and the results are noted below with a summary of recommendations for lipid management.    Cholesterol:       116     Goal: <200   HDL "good" Cholesterol:   41.32     Goal: >40   LDL "bad" Cholesterol:   46     Goal: <100   Triglycerides:       53.0     Goal: <150        TLC Diet (Therapeutic Lifestyle Change): Saturated Fats & Transfatty acids should be kept < 7% of total calories ***Reduce Saturated Fats Polyunstaurated Fat can be up to 10% of total calories Monounsaturated Fat Fat can be up to 20% of total calories Total Fat should be no greater than 25-35% of total calories Carbohydrates should be 50-60% of total calories Protein should be approximately 15% of total calories Fiber should be at least 20-30 grams a day ***Increased fiber may help lower LDL Total Cholesterol should be < 200mg /day Consider adding plant stanol/sterols to diet (example: Benacol spread) ***A higher intake of unsaturated fat may reduce Triglycerides and Increase HDL    Adjunctive Measures (may lower LIPIDS and reduce risk of Heart Attack) include: Aerobic Exercise (20-30 minutes 3-4 times a week) Limit Alcohol Consumption Weight Reduction Aspirin 75-81 mg a day by mouth (if not allergic or contraindicated) Dietary Fiber 20-30 grams a day by mouth     Current Medications: 1)    Amlodipine Besylate 10 Mg Tabs (Amlodipine besylate) .... Take 1 tablet by mouth once a day 2)    Metformin Hcl 500 Mg Tb24 (Metformin hcl) .... Take 1 tablet by mouth once daily 3)    Ecotrin Low Strength 81 Mg  Tbec (Aspirin) .Marland Kitchen.. 1 by mouth qd 4)    Furosemide 20 Mg Tabs (Furosemide) .Marland Kitchen.. 1 by mouth once daily 5)    Klor-con M20  20 Meq Cr-tabs (Potassium chloride crys cr) .Marland Kitchen.. 1 three times a day 6)    High Potency D-1000  7)    Glucosamine  8)    Crestor 10 Mg Tabs (Rosuvastatin calcium) .... One by mouth once daily for cholesterol 9)    Tamoxifen Citrate 20 Mg Tabs (Tamoxifen citrate) .... One by mouth once daily  If you have any questions, please call. We appreciate being able to work with you.   Sincerely,    [name] Brittany Grandchild MD

## 2010-07-31 NOTE — Progress Notes (Signed)
----   Converted from flag ---- ---- 02/10/2010 9:45 AM, Etta Grandchild MD wrote: yes  ---- 02/10/2010 9:36 AM, Rock Nephew CMA wrote: Patient will need BMP labs. Is this ok  ---- 02/05/2010 8:49 AM, Etta Grandchild MD wrote: LA: please set her up for a reclast injection  TJ ------------------------------  Phone Note Outgoing Call   Call placed by: Rock Nephew CMA,  February 12, 2010 10:43 AM Call placed to: Patient Summary of Call: Patient has follow up appt 02/13/10 to discuss.Marland KitchenMarland KitchenMarland KitchenAlvy Beal Archie CMA  February 13, 2010 3:12 PM

## 2010-07-31 NOTE — Letter (Signed)
Summary: Lipid Letter  [facility]  [address] [place], [place] [address]   Phone: [phone]  Fax: [phone]    07/17/2009  Brittany [name] [address] [place], [place]  [address]  Dear Brittany [name]:  We have carefully reviewed your last lipid profile from 07/17/2009 and the results are noted below with a summary of recommendations for lipid management.    Cholesterol:       116     Goal: <200   HDL "good" Cholesterol:   57.84     Goal: >40   LDL "bad" Cholesterol:   48     Goal: <100   Triglycerides:       50.0     Goal: <150    very good results    TLC Diet (Therapeutic Lifestyle Change): Saturated Fats & Transfatty acids should be kept < 7% of total calories ***Reduce Saturated Fats Polyunstaurated Fat can be up to 10% of total calories Monounsaturated Fat Fat can be up to 20% of total calories Total Fat should be no greater than 25-35% of total calories Carbohydrates should be 50-60% of total calories Protein should be approximately 15% of total calories Fiber should be at least 20-30 grams a day ***Increased fiber may help lower LDL Total Cholesterol should be < 200mg /day Consider adding plant stanol/sterols to diet (example: Benacol spread) ***A higher intake of unsaturated fat may reduce Triglycerides and Increase HDL    Adjunctive Measures (may lower LIPIDS and reduce risk of Heart Attack) include: Aerobic Exercise (20-30 minutes 3-4 times a week) Limit Alcohol Consumption Weight Reduction Aspirin 75-81 mg a day by mouth (if not allergic or contraindicated) Dietary Fiber 20-30 grams a day by mouth     Current Medications: 1)    Amlodipine Besylate 10 Mg Tabs (Amlodipine besylate) .... Take 1 tablet by mouth once a day 2)    Metformin Hcl 500 Mg Tb24 (Metformin hcl) .... Take 1 tablet by mouth once daily 3)    Ecotrin Low Strength 81 Mg  Tbec (Aspirin) .Marland Kitchen.. 1 by mouth qd 4)    Hydrochlorothiazide 25 Mg  Tabs (Hydrochlorothiazide) .... Take  1 tab by mouth every morning 5)    Furosemide 20 Mg Tabs (Furosemide) .Marland Kitchen.. 1 by mouth once daily 6)    Simvastatin 40 Mg Tabs (Simvastatin) .Marland Kitchen.. 1 by mouth once daily 7)    Klor-con M20 20 Meq Cr-tabs (Potassium chloride crys cr) .Marland Kitchen.. 1 three times a day 8)    High Potency D-1000  9)    Glucosamine  10)    Tamoxifen Citrate 20 Mg Tabs (Tamoxifen citrate)  If you have any questions, please call. We appreciate being able to work with you.   Sincerely,    [facility] Brittany Levins MD

## 2010-07-31 NOTE — Assessment & Plan Note (Signed)
Summary: f/u appt/pt rec'd letter to f/u/cd   Vital Signs:  Patient profile:   77 year old female Height:      59 inches Weight:      189.25 pounds BMI:     38.36 O2 Sat:      97 % on Room air Temp:     98.2 degrees F oral Pulse rate:   82 / minute Pulse rhythm:   regular BP sitting:   142 / 82  (left arm) Cuff size:   large  Vitals Entered By: Rock Nephew CMA (February 13, 2010 3:53 PM)  O2 Flow:  Room air  Primary Care Yee Gangi:  Etta Grandchild MD   History of Present Illness: She returns for f/up and she says that she wants to do Reclast for osteoporosis- she has had esophagitis with other oral bisphosphonates.  She is concerned about the abnormal urinalysis but she does not have any UTI symptoms.  Hypertension History:      She denies headache, chest pain, palpitations, dyspnea with exertion, orthopnea, PND, peripheral edema, visual symptoms, neurologic problems, syncope, and side effects from treatment.  She notes no problems with any antihypertensive medication side effects.        Positive major cardiovascular risk factors include female age 5 years old or older, diabetes, hyperlipidemia, and hypertension.  Negative major cardiovascular risk factors include negative family history for ischemic heart disease and non-tobacco-user status.        Positive history for target organ damage include cardiac end organ damage (either CHF or LVH).  Further assessment for target organ damage reveals no history of ASHD, stroke/TIA, peripheral vascular disease, renal insufficiency, or hypertensive retinopathy.    Preventive Screening-Counseling & Management  Alcohol-Tobacco     Alcohol drinks/day: 0     Smoking Status: never  Hep-HIV-STD-Contraception     Hepatitis Risk: no risk noted     HIV Risk: no risk noted     STD Risk: no risk noted  Clinical Review Panels:  Diabetes Management   HgBA1C:  6.2 (02/05/2010)   Creatinine:  0.6 (02/05/2010)   Last Dilated Eye Exam:   diabetic retinopathy (05/01/2009)   Last Foot Exam:  yes (02/13/2010)   Last Pneumovax:  given (04/30/2005)  CBC   WBC:  4.3 (02/05/2010)   RBC:  4.30 (02/05/2010)   Hgb:  12.6 (02/05/2010)   Hct:  37.6 (02/05/2010)   Platelets:  300.0 (02/05/2010)   MCV  87.4 (02/05/2010)   MCHC  33.5 (02/05/2010)   RDW  14.0 (02/05/2010)   PMN:  42.3 (02/05/2010)   Lymphs:  46.2 (02/05/2010)   Monos:  8.4 (02/05/2010)   Eosinophils:  2.7 (02/05/2010)   Basophil:  0.4 (02/05/2010)  Complete Metabolic Panel   Glucose:  100 (02/05/2010)   Sodium:  140 (02/05/2010)   Potassium:  3.9 (02/05/2010)   Chloride:  109 (02/05/2010)   CO2:  26 (02/05/2010)   BUN:  10 (02/05/2010)   Creatinine:  0.6 (02/05/2010)   Albumin:  3.6 (02/05/2010)   Total Protein:  6.8 (02/05/2010)   Calcium:  8.7 (02/05/2010)   Total Bili:  0.4 (02/05/2010)   Alk Phos:  58 (02/05/2010)   SGPT (ALT):  18 (02/05/2010)   SGOT (AST):  23 (02/05/2010)   Medications Prior to Update: 1)  Amlodipine Besylate 10 Mg Tabs (Amlodipine Besylate) .... Take 1 Tablet By Mouth Once A Day 2)  Metformin Hcl 500 Mg Tb24 (Metformin Hcl) .... Take 1 Tablet By Mouth Once Daily 3)  Ecotrin Low Strength 81 Mg  Tbec (Aspirin) .Marland Kitchen.. 1 By Mouth Qd 4)  Furosemide 20 Mg Tabs (Furosemide) .Marland Kitchen.. 1 By Mouth Once Daily 5)  Klor-Con M20 20 Meq Cr-Tabs (Potassium Chloride Crys Cr) .Marland Kitchen.. 1 Three Times A Day 6)  High Potency D-1000 7)  Glucosamine 8)  Crestor 10 Mg Tabs (Rosuvastatin Calcium) .... One By Mouth Once Daily For Cholesterol 9)  Tamoxifen Citrate 20 Mg Tabs (Tamoxifen Citrate) .... One By Mouth Once Daily  Current Medications (verified): 1)  Amlodipine Besylate 10 Mg Tabs (Amlodipine Besylate) .... Take 1 Tablet By Mouth Once A Day 2)  Metformin Hcl 500 Mg Tb24 (Metformin Hcl) .... Take 1 Tablet By Mouth Once Daily 3)  Ecotrin Low Strength 81 Mg  Tbec (Aspirin) .Marland Kitchen.. 1 By Mouth Qd 4)  Furosemide 20 Mg Tabs (Furosemide) .Marland Kitchen.. 1 By Mouth Once  Daily 5)  Klor-Con M20 20 Meq Cr-Tabs (Potassium Chloride Crys Cr) .Marland Kitchen.. 1 Three Times A Day 6)  High Potency D-1000 7)  Glucosamine 8)  Crestor 10 Mg Tabs (Rosuvastatin Calcium) .... One By Mouth Once Daily For Cholesterol 9)  Tamoxifen Citrate 20 Mg Tabs (Tamoxifen Citrate) .... One By Mouth Once Daily  Allergies (verified): 1)  ! Alendronate Sodium (Alendronate Sodium) 2)  Lisinopril  Past History:  Past Medical History: Last updated: 05/05/2007 Hypertension Osteoporosis Diabetes mellitus, type II Hyperlipidemia hx of pos PPD Diverticulosis, colon DJD left knee  Past Surgical History: Last updated: 05/05/2007 none  Family History: Last updated: 05/05/2007 Family History of CAD Female 1st degree relative Family History Diabetes 1st degree relative Family History of Stroke M 1st degree relative cervical cancer Lupus Family History Hypertension  Social History: Last updated: 04/02/2008 Never Smoked Alcohol use-yes widow 4 children 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 Family History of CAD Female 1st degree relative Family History Diabetes 1st degree relative Family History of Stroke M 1st degree relative cervical cancer Lupus Family History Hypertension  Social History: Reviewed history from 04/02/2008 and no changes required. 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   GU:  Denies abnormal vaginal bleeding, decreased libido,  dysuria, hematuria, incontinence, nocturia, urinary frequency, and urinary hesitancy.  Physical Exam  General:  alert, well-developed, well-nourished, and well-hydrated.  overweight-appearing.   Head:  normocephalic and atraumatic.   Mouth:  Oral mucosa and oropharynx without lesions or exudates.  Teeth in good repair. Neck:  supple, full ROM, no masses, no carotid bruits, and no neck tenderness.   Lungs:  normal respiratory effort, no intercostal retractions, no accessory muscle use, normal breath sounds, no dullness, no fremitus, no crackles, and no wheezes.   Heart:  normal rate, regular rhythm, no murmur, no gallop, no rub, and no JVD.   Abdomen:  soft, non-tender, normal bowel sounds, no distention, no masses, no guarding, no rigidity, no rebound tenderness, no abdominal hernia, no inguinal hernia, no hepatomegaly, and no splenomegaly.   Msk:  No deformity or scoliosis noted of thoracic or lumbar spine.   Pulses:  R and L carotid,radial,femoral,dorsalis pedis and posterior  tibial pulses are full and equal bilaterally Extremities:  No clubbing, cyanosis, edema, or deformity noted with normal full range of motion of all joints.   Neurologic:  No cranial nerve deficits noted. Station and gait are normal. Plantar reflexes are down-going bilaterally. DTRs are symmetrical throughout. Sensory, motor and coordinative functions appear intact. Skin:  turgor normal, color normal, no rashes, no suspicious lesions, no ecchymoses, no petechiae, no purpura, no ulcerations, and no edema.   Cervical Nodes:  no anterior cervical adenopathy and no posterior cervical adenopathy.   Axillary Nodes:  no R axillary adenopathy and no L axillary adenopathy.   Inguinal Nodes:  no R inguinal adenopathy and no L inguinal adenopathy.   Psych:  Cognition and judgment appear intact. Alert and cooperative with normal attention span and concentration. No apparent delusions, illusions, hallucinations  Diabetes Management  Exam:    Foot Exam (with socks and/or shoes not present):       Sensory-Pinprick/Light touch:          Left medial foot (L-4): normal          Left dorsal foot (L-5): normal          Left lateral foot (S-1): normal          Right medial foot (L-4): normal          Right dorsal foot (L-5): normal          Right lateral foot (S-1): normal       Sensory-Monofilament:          Left foot: normal          Right foot: normal       Inspection:          Left foot: normal          Right foot: normal       Nails:          Left foot: normal          Right foot: normal   Impression & Recommendations:  Problem # 1:  UTI (ICD-599.0) Assessment New  will check culture and start antibiotics if it is positive, otherwise no antibiotics Orders: T-Urine Culture (Spectrum Order) ([id number]) TLB-Udip w/ Micro (81001-URINE)  Encouraged to push clear liquids, get enough rest, and take acetaminophen as needed. To be seen in 10 days if no improvement, sooner if worse.  Problem # 2:  DIABETES MELLITUS, TYPE II (ICD-250.00) Assessment: Improved  Her updated medication list for this problem includes:    Metformin Hcl 500 Mg Tb24 (Metformin hcl) .Marland Kitchen... Take 1 tablet by mouth once daily    Ecotrin Low Strength 81 Mg Tbec (Aspirin) .Marland Kitchen... 1 by mouth qd  Labs Reviewed: Creat: 0.6 (02/05/2010)     Last Eye Exam: diabetic retinopathy (05/01/2009) Reviewed HgBA1c results: 6.2 (02/05/2010)  6.4 (10/17/2009)  Problem # 3:  OSTEOPOROSIS (ICD-733.00) Assessment: Unchanged start Reclast  Problem # 4:  HYPERTENSION (ICD-401.9) Assessment: Unchanged  Her updated medication list for this problem includes:    Amlodipine Besylate 10 Mg Tabs (Amlodipine besylate) .Marland Kitchen... Take 1 tablet by mouth once a day    Furosemide 20 Mg Tabs (Furosemide) .Marland Kitchen... 1 by mouth once daily  BP today: 142/82 Prior BP: 138/78 (02/05/2010)  Prior 10 Yr Risk Heart Disease: 13 % (07/17/2009)  Labs Reviewed: K+: 3.9  (02/05/2010) Creat: : 0.6 (02/05/2010)   Chol: 151 (02/05/2010)   HDL: 54.50 (02/05/2010)   LDL: 78 (02/05/2010)   TG: 91.0 (02/05/2010)  Complete Medication List:  1)  Amlodipine Besylate 10 Mg Tabs (Amlodipine besylate) .... Take 1 tablet by mouth once a day 2)  Metformin Hcl 500 Mg Tb24 (Metformin hcl) .... Take 1 tablet by mouth once daily 3)  Ecotrin Low Strength 81 Mg Tbec (Aspirin) .Marland Kitchen.. 1 by mouth qd 4)  Furosemide 20 Mg Tabs (Furosemide) .Marland Kitchen.. 1 by mouth once daily 5)  Klor-con M20 20 Meq Cr-tabs (Potassium chloride crys cr) .Marland Kitchen.. 1 three times a day 6)  High Potency D-1000  7)  Glucosamine  8)  Crestor 10 Mg Tabs (Rosuvastatin calcium) .... One by mouth once daily for cholesterol 9)  Tamoxifen Citrate 20 Mg Tabs (Tamoxifen citrate) .... One by mouth once daily  Hypertension Assessment/Plan:      The patient's hypertensive risk group is category C: Target organ damage and/or diabetes.  Her calculated 10 year risk of coronary heart disease is 17 %.  Today's blood pressure is 142/82.  Her blood pressure goal is < 130/80.   Patient Instructions: 1)  Please schedule a follow-up appointment in 2 months. 2)  It is important that you exercise regularly at least 20 minutes 5 times a week. If you develop chest pain, have severe difficulty breathing, or feel very tired , stop exercising immediately and seek medical attention. 3)  You need to lose weight. Consider a lower calorie diet and regular exercise.  4)  Check your blood sugars regularly. If your readings are usually above 200 or below 70 you should contact our office. 5)  It is important that your Diabetic A1c level is checked every 3 months. 6)  See your eye doctor yearly to check for diabetic eye damage. 7)  Check your feet each night for sore areas, calluses or signs of infection. 8)  Check your Blood Pressure regularly. If it is above 130/80: you should make an appointment. 9)  Take your antibiotic as prescribed until ALL of it is  gone, but stop if you develop a rash or swelling and contact our office as soon as possible.

## 2010-07-31 NOTE — Letter (Signed)
Summary: Results Follow-up Letter  [facility]  [address] [place], [place] [address]   Phone: [phone]  Fax: [phone]    10/17/2009  [address] [place], [place]  [address]  Dear Ms. ACE,   The following are the results of your recent test(s):  Test     Result     Potassium level   normal Kidney     normal Blood sugars   normal Urine       trace of infection   _________________________________________________________  Please call for an appointment if needed _________________________________________________________ _________________________________________________________ _________________________________________________________  Sincerely,  Sanda Linger MD [facility]

## 2010-08-05 ENCOUNTER — Encounter (HOSPITAL_BASED_OUTPATIENT_CLINIC_OR_DEPARTMENT_OTHER): Payer: Medicare Other | Admitting: Oncology

## 2010-08-05 ENCOUNTER — Encounter: Payer: Self-pay | Admitting: Internal Medicine

## 2010-08-05 DIAGNOSIS — Z170 Estrogen receptor positive status [ER+]: Secondary | ICD-10-CM

## 2010-08-05 DIAGNOSIS — C50519 Malignant neoplasm of lower-outer quadrant of unspecified female breast: Secondary | ICD-10-CM

## 2010-08-06 ENCOUNTER — Other Ambulatory Visit: Payer: Self-pay | Admitting: Oncology

## 2010-08-06 DIAGNOSIS — Z1231 Encounter for screening mammogram for malignant neoplasm of breast: Secondary | ICD-10-CM

## 2010-08-06 DIAGNOSIS — Z853 Personal history of malignant neoplasm of breast: Secondary | ICD-10-CM

## 2010-08-06 DIAGNOSIS — Z9889 Other specified postprocedural states: Secondary | ICD-10-CM

## 2010-08-06 DIAGNOSIS — Z780 Asymptomatic menopausal state: Secondary | ICD-10-CM

## 2010-09-09 NOTE — Letter (Signed)
Summary: [facility]  [facility]   Imported By: Lennie Odor 09/02/2010 15:38:46  _____________________________________________________________________  External Attachment:    Type:   Image     Comment:   External Document

## 2010-09-15 ENCOUNTER — Ambulatory Visit
Admission: RE | Admit: 2010-09-15 | Discharge: 2010-09-15 | Disposition: A | Discharge status: Home / Self Care | Payer: Medicare Other | Source: Ambulatory Visit | Attending: Oncology | Admitting: Oncology

## 2010-09-15 DIAGNOSIS — Z853 Personal history of malignant neoplasm of breast: Secondary | ICD-10-CM

## 2010-09-15 DIAGNOSIS — Z1231 Encounter for screening mammogram for malignant neoplasm of breast: Secondary | ICD-10-CM

## 2010-09-15 DIAGNOSIS — Z780 Asymptomatic menopausal state: Secondary | ICD-10-CM

## 2010-09-15 DIAGNOSIS — Z9889 Other specified postprocedural states: Secondary | ICD-10-CM

## 2010-09-18 ENCOUNTER — Other Ambulatory Visit: Payer: Self-pay | Admitting: Oncology

## 2010-09-23 ENCOUNTER — Encounter: Payer: Self-pay | Admitting: Internal Medicine

## 2010-09-23 ENCOUNTER — Ambulatory Visit (INDEPENDENT_AMBULATORY_CARE_PROVIDER_SITE_OTHER): Payer: Medicare Other | Admitting: Internal Medicine

## 2010-09-23 ENCOUNTER — Other Ambulatory Visit (INDEPENDENT_AMBULATORY_CARE_PROVIDER_SITE_OTHER): Payer: Medicare Other

## 2010-09-23 DIAGNOSIS — E876 Hypokalemia: Secondary | ICD-10-CM

## 2010-09-23 DIAGNOSIS — N390 Urinary tract infection, site not specified: Secondary | ICD-10-CM

## 2010-09-23 DIAGNOSIS — Z23 Encounter for immunization: Secondary | ICD-10-CM

## 2010-09-23 DIAGNOSIS — E785 Hyperlipidemia, unspecified: Secondary | ICD-10-CM

## 2010-09-23 DIAGNOSIS — E119 Type 2 diabetes mellitus without complications: Secondary | ICD-10-CM

## 2010-09-23 DIAGNOSIS — I10 Essential (primary) hypertension: Secondary | ICD-10-CM

## 2010-09-23 LAB — URINALYSIS, ROUTINE W REFLEX MICROSCOPIC
Bilirubin Urine: NEGATIVE
Hgb urine dipstick: NEGATIVE
Ketones, ur: NEGATIVE
Nitrite: NEGATIVE
Urobilinogen, UA: 0.2 (ref 0.0–1.0)

## 2010-09-23 LAB — CBC WITH DIFFERENTIAL/PLATELET
Basophils Absolute: 0 10*3/uL (ref 0.0–0.1)
Hemoglobin: 12.7 g/dL (ref 12.0–15.0)
Lymphocytes Relative: 34.8 % (ref 12.0–46.0)
Monocytes Relative: 8.4 % (ref 3.0–12.0)
Neutro Abs: 2.2 10*3/uL (ref 1.4–7.7)
Neutrophils Relative %: 53.7 % (ref 43.0–77.0)
RDW: 14.2 % (ref 11.5–14.6)

## 2010-09-23 LAB — COMPREHENSIVE METABOLIC PANEL
ALT: 19 U/L (ref 0–35)
Albumin: 3.5 g/dL (ref 3.5–5.2)
BUN: 10 mg/dL (ref 6–23)
CO2: 26 mEq/L (ref 19–32)
Calcium: 9.3 mg/dL (ref 8.4–10.5)
Chloride: 109 mEq/L (ref 96–112)
Creatinine, Ser: 0.7 mg/dL (ref 0.4–1.2)
GFR: 108.93 mL/min (ref >60.00)

## 2010-09-23 LAB — HEMOGLOBIN A1C: Hgb A1c MFr Bld: 6.5 % (ref 4.6–6.5)

## 2010-09-23 MED ORDER — ZOSTER VACCINE LIVE 19400 UNT/0.65ML ~~LOC~~ SOLR
0.6500 mL | Freq: Once | SUBCUTANEOUS | Status: AC
  Administered 2010-09-23: 19400 [IU] via SUBCUTANEOUS

## 2010-09-23 NOTE — Assessment & Plan Note (Signed)
She is doing well on metformin, will monitor renal function and check efficacy with A1C level

## 2010-09-23 NOTE — Assessment & Plan Note (Signed)
Will recheck K+ level today

## 2010-09-23 NOTE — Patient Instructions (Signed)
Diabetes, Type 2 Diabetes is a lasting (chronic) disease. In type 2 diabetes, the pancreas does not make enough insulin (a hormone), and the body does not respond normally to the insulin that is made. This type of diabetes was also previously called adult onset diabetes. About 90% of all those who have diabetes have type 2. It usually occurs after the age of 40 but can occur at any age. CAUSES Unlike type 1 diabetes, which happens because insulin is no longer being made, type 2 diabetes happens because the body is making less insulin and has trouble using the insulin properly. SYMPTOMS  Drinking more than usual.   Urinating more than usual.   Blurred vision.   Dry, itchy skin.   Frequent infection like yeast infections in women.   More tired than usual (fatigue).  TREATMENT  Healthy eating.   Exercise.   Medication, if needed.   Monitoring blood glucose (sugar).   Seeing your caregiver regularly.  HOME CARE INSTRUCTIONS  Check your blood glucose (sugar) at least once daily. More frequent monitoring may be necessary, depending on your medications and on how well your diabetes is controlled. Your caregiver will advise you.   Take your medicine as directed by your caregiver.   Do not smoke.   Make wise food choices. Ask your caregiver for information. Weight loss can improve your diabetes.   Learn about low blood glucose (hypoglycemia) and how to treat it.   Get your eyes checked regularly.   Have a yearly physical exam. Have your blood pressure checked. Get your blood and urine tested.   Wear a pendant or bracelet saying that you have diabetes.   Check your feet every night for sores. Let your caregiver know if you have sores that are not healing.  SEEK MEDICAL CARE IF:  You are having problems keeping your blood glucose at target range.   You feel you might be having problems with your medicines.   You have symptoms of an illness that is not improving after 24  hours.   You have a sore or wound that is not healing.   You notice a change in vision or a new problem with your vision.   You develop a fever of more than 100.  Document Released: 06/15/2005 Document Re-Released: 07/07/2009 [employer] Patient Information 2011 [employer]. 

## 2010-09-23 NOTE — Progress Notes (Signed)
  Subjective:    Patient ID: Brittany [name], female    DOB: March 31, 1929, 77 y.o.   MRN: [id number]  Diabetes She presents for her follow-up diabetic visit. She has type 2 diabetes mellitus. No MedicAlert identification noted. Her disease course has been stable. There are no hypoglycemic associated symptoms. Pertinent negatives for hypoglycemia include no confusion, dizziness, headaches, pallor or speech difficulty. Pertinent negatives for diabetes include no blurred vision, no chest pain, no fatigue, no foot paresthesias, no foot ulcerations, no polydipsia, no polyphagia, no polyuria, no visual change, no weakness and no weight loss. There are no hypoglycemic complications. Symptoms are stable. There are no diabetic complications. Current diabetic treatment includes oral agent (dual therapy). She is compliant with treatment all of the time. Her weight is stable. She is following a generally healthy diet. She participates in exercise intermittently. Her home blood glucose trend is decreasing steadily. Her breakfast blood glucose range is generally 110-130 mg/dl.      Review of Systems  Constitutional: Negative for fever, chills, weight loss, diaphoresis, activity change, appetite change, fatigue and unexpected weight change.  HENT: Negative for neck pain and neck stiffness.   Eyes: Negative for blurred vision, photophobia and visual disturbance.  Respiratory: Negative for apnea, cough, chest tightness, shortness of breath, wheezing and stridor.   Cardiovascular: Negative for chest pain, palpitations and leg swelling.  Gastrointestinal: Negative for nausea, abdominal pain, diarrhea, constipation and abdominal distention.  Genitourinary: Negative for dysuria, urgency, polyuria, frequency, hematuria, flank pain, decreased urine volume, difficulty urinating, vaginal pain and pelvic pain.  Musculoskeletal: Negative for back pain, joint swelling, arthralgias and gait problem.  Skin: Negative for color  change, pallor and rash.  Neurological: Negative for dizziness, speech difficulty, weakness, light-headedness, numbness and headaches.  Hematological: Negative for polydipsia, polyphagia and adenopathy. Does not bruise/bleed easily.  Psychiatric/Behavioral: Negative for behavioral problems, confusion and agitation.       Objective:   Physical Exam  Constitutional: She is oriented to person, place, and time. She appears well-developed and well-nourished. No distress.  HENT:  Head: Normocephalic and atraumatic.  Right Ear: External ear normal.  Nose: Nose normal.  Mouth/Throat: Oropharynx is clear and moist. No oropharyngeal exudate.  Eyes: Conjunctivae and EOM are normal. Pupils are equal, round, and reactive to light. Right eye exhibits no discharge. Left eye exhibits no discharge. No scleral icterus.  Neck: Normal range of motion. Neck supple. No thyromegaly present.  Cardiovascular: Normal rate, regular rhythm, normal heart sounds and intact distal pulses.  Exam reveals no gallop and no friction rub.   No murmur heard. Pulmonary/Chest: Effort normal and breath sounds normal. No respiratory distress. She has no wheezes. She has no rales. She exhibits no tenderness.  Abdominal: Soft. She exhibits no distension and no mass. There is no tenderness. There is no rebound and no guarding.  Musculoskeletal: Normal range of motion. She exhibits no edema and no tenderness.  Lymphadenopathy:    She has no cervical adenopathy.  Neurological: She is alert and oriented to person, place, and time. She has normal reflexes. She displays normal reflexes. No cranial nerve deficit. She exhibits normal muscle tone. Coordination normal.  Skin: Skin is warm and dry. No rash noted. She is not diaphoretic. No erythema. No pallor.  Psychiatric: She has a normal mood and affect. Her behavior is normal. Judgment normal.          Assessment & Plan:

## 2010-09-23 NOTE — Assessment & Plan Note (Signed)
She has no s/s of infection today, will check UA and culture

## 2010-09-23 NOTE — Assessment & Plan Note (Signed)
BP looks good today, will continue current meds and monitor renal function and lytes

## 2010-09-23 NOTE — Assessment & Plan Note (Signed)
She is doing well on crestor, will continue and check LFT's today

## 2010-09-26 LAB — URINE CULTURE: Colony Count: 50000

## 2010-09-30 ENCOUNTER — Ambulatory Visit
Admission: RE | Admit: 2010-09-30 | Discharge: 2010-09-30 | Disposition: A | Discharge status: Home / Self Care | Payer: Medicare Other | Source: Ambulatory Visit | Attending: Oncology | Admitting: Oncology

## 2010-09-30 DIAGNOSIS — Z853 Personal history of malignant neoplasm of breast: Secondary | ICD-10-CM

## 2010-10-08 LAB — COMPREHENSIVE METABOLIC PANEL
AST: 24 U/L (ref 0–37)
Albumin: 3.7 g/dL (ref 3.5–5.2)
Alkaline Phosphatase: 72 U/L (ref 39–117)
BUN: 4 mg/dL — ABNORMAL LOW (ref 6–23)
CO2: 27 mEq/L (ref 19–32)
Chloride: 103 mEq/L (ref 96–112)
Creatinine, Ser: 0.67 mg/dL (ref 0.4–1.2)
GFR calc non Af Amer: >60 mL/min (ref >60)
Potassium: 3 mEq/L — ABNORMAL LOW (ref 3.5–5.1)
Total Bilirubin: 0.6 mg/dL (ref 0.3–1.2)

## 2010-10-08 LAB — URINALYSIS, ROUTINE W REFLEX MICROSCOPIC
Bilirubin Urine: NEGATIVE
Ketones, ur: NEGATIVE mg/dL
Nitrite: NEGATIVE
Urobilinogen, UA: 1 mg/dL (ref 0.0–1.0)
pH: 7 (ref 5.0–8.0)

## 2010-10-08 LAB — POCT I-STAT 4, (NA,K, GLUC, HGB,HCT)
Glucose, Bld: 109 mg/dL — ABNORMAL HIGH (ref 70–99)
HCT: 41 % (ref 36.0–46.0)
Hemoglobin: 13.9 g/dL (ref 12.0–15.0)
Potassium: 3.1 mEq/L — ABNORMAL LOW (ref 3.5–5.1)
Sodium: 142 mEq/L (ref 135–145)

## 2010-10-08 LAB — DIFFERENTIAL
Basophils Absolute: 0 10*3/uL (ref 0.0–0.1)
Basophils Relative: 1 % (ref 0–1)
Eosinophils Relative: 1 % (ref 0–5)
Lymphocytes Relative: 36 % (ref 12–46)
Monocytes Absolute: 0.3 10*3/uL (ref 0.1–1.0)
Neutro Abs: 3 10*3/uL (ref 1.7–7.7)

## 2010-10-08 LAB — CBC
HCT: 38 % (ref 36.0–46.0)
MCV: 86.2 fL (ref 78.0–100.0)
RBC: 4.4 MIL/uL (ref 3.87–5.11)
WBC: 5.4 10*3/uL (ref 4.0–10.5)

## 2010-10-08 LAB — GLUCOSE, CAPILLARY: Glucose-Capillary: 115 mg/dL — ABNORMAL HIGH (ref 70–99)

## 2010-11-11 ENCOUNTER — Encounter (INDEPENDENT_AMBULATORY_CARE_PROVIDER_SITE_OTHER): Payer: Self-pay | Admitting: Surgery

## 2010-11-11 NOTE — Op Note (Signed)
NAMENYLAN, NAKATANI                ACCOUNT NO.:  [id number]   MEDICAL RECORD NO.:  [id number]          PATIENT TYPE:  AMB   LOCATION:  SDS                          FACILITY:  [facility]   PHYSICIAN:  Currie Paris, M.D.DATE OF BIRTH:  12/06/1928   DATE OF PROCEDURE:  10/10/2008  DATE OF DISCHARGE:  10/10/2008                               OPERATIVE REPORT   PREOPERATIVE DIAGNOSIS:  Ductal carcinoma in situ right breast lower  outer quadrant.   POSTOPERATIVE DIAGNOSIS:  Ductal carcinoma in situ right breast lower  outer quadrant.   OPERATION:  Needle-guided excision of right breast cancer.   SURGEON:  Currie Paris, MD   ANESTHESIA:  General.   CLINICAL HISTORY:  This is a 77 year old lady who was initially seen in  our office by Dr. Lorelee New in 2008 for DCIS of the right breast at  the 9 o'clock position.  She declined any therapy.  She was seen again  in 2009 by Dr. Leonie Man and again declined surgery but initially  agreed to take tamoxifen.  However, she took no tamoxifen.  Dr. Jonny Ruiz,  her primary physician, was able to get her to have another mammogram  recently and the area of the DCIS has gotten larger.  She saw me and at  last has agreed to have an excision of the breast cancer.  She did not  wish to have any further surgery other than that at this point in time.   DESCRIPTION OF PROCEDURE:  The patient was seen in the holding area and  she had no further questions.  We confirmed and marked the right breast  as the operative side.  I reviewed the needle localizing films and it  looked like the guidewire was in good position.   The patient was taken to the operating room and after satisfactory  general anesthesia had been obtained, the right breast was prepped and  draped.  The time-out was done.   I made a somewhat radial incision starting at the guidewire entry site  which was lateral in the breast and I made the incision going towards  the areolar  margin over the apparent tract of the guidewire.  I raised  the skin flaps and mobilized the wire into the wound.  Using the  cautery, I started to pass the wire laterally so that I could mobilize  the wire and the surrounding tissue up into the wound and then took a  wide excision of tissue around the guidewire.  I appeared to be  completely around any abnormality although I could not really palpate a  mass within the specimen.  I got completely beyond the tip of the  guidewire.   To be certain of my deep margin, I went ahead and took some extra tissue  there but I thought clinically I was around everything nicely.   I infiltrated 0.25% plain Marcaine to help with postop analgesia.  I  closed in layers with 3-0 Vicryl and 4-0 Monocryl subcuticular.   Radiology confirmed that the clip and the mass were within the specimen  by  mammography.   The patient was then awakened and taken to the recovery room.  She  tolerated the procedure well and there were no operative complications.  All counts were correct.      Currie Paris, M.D.  Electronically Signed     CJS/MEDQ  D:  10/10/2008  T:  10/11/2008  Job:  [id number]   cc:   Corwin Levins, MD

## 2010-12-08 ENCOUNTER — Encounter: Payer: Self-pay | Admitting: Internal Medicine

## 2010-12-09 ENCOUNTER — Other Ambulatory Visit: Payer: Medicare Other

## 2010-12-09 ENCOUNTER — Encounter: Payer: Self-pay | Admitting: Internal Medicine

## 2010-12-09 ENCOUNTER — Ambulatory Visit (INDEPENDENT_AMBULATORY_CARE_PROVIDER_SITE_OTHER): Payer: Medicare Other | Admitting: Internal Medicine

## 2010-12-09 ENCOUNTER — Other Ambulatory Visit (INDEPENDENT_AMBULATORY_CARE_PROVIDER_SITE_OTHER): Payer: Medicare Other

## 2010-12-09 DIAGNOSIS — I10 Essential (primary) hypertension: Secondary | ICD-10-CM

## 2010-12-09 DIAGNOSIS — I509 Heart failure, unspecified: Secondary | ICD-10-CM

## 2010-12-09 DIAGNOSIS — E876 Hypokalemia: Secondary | ICD-10-CM

## 2010-12-09 DIAGNOSIS — E785 Hyperlipidemia, unspecified: Secondary | ICD-10-CM

## 2010-12-09 DIAGNOSIS — E119 Type 2 diabetes mellitus without complications: Secondary | ICD-10-CM

## 2010-12-09 LAB — COMPREHENSIVE METABOLIC PANEL
AST: 24 U/L (ref 0–37)
BUN: 14 mg/dL (ref 6–23)
Calcium: 8.9 mg/dL (ref 8.4–10.5)
Chloride: 109 mEq/L (ref 96–112)
Creatinine, Ser: 0.8 mg/dL (ref 0.4–1.2)
Total Bilirubin: 0.4 mg/dL (ref 0.3–1.2)

## 2010-12-09 LAB — LIPID PANEL
HDL: 59.5 mg/dL (ref >39.00)
LDL Cholesterol: 108 mg/dL — ABNORMAL HIGH (ref 0–99)
Total CHOL/HDL Ratio: 3
Triglycerides: 65 mg/dL (ref 0.0–149.0)
VLDL: 13 mg/dL (ref 0.0–40.0)

## 2010-12-09 LAB — HEMOGLOBIN A1C: Hgb A1c MFr Bld: 6.5 % (ref 4.6–6.5)

## 2010-12-09 MED ORDER — PITAVASTATIN CALCIUM 4 MG PO TABS: 1.0000 | ORAL_TABLET | Freq: Every day | ORAL | Status: DC

## 2010-12-09 NOTE — Assessment & Plan Note (Signed)
I gave gave her samples of Livalo to help with the cost of statin therapy

## 2010-12-09 NOTE — Patient Instructions (Signed)
Diabetes, Type 2 Diabetes is a lasting (chronic) disease. In type 2 diabetes, the pancreas does not make enough insulin (a hormone), and the body does not respond normally to the insulin that is made. This type of diabetes was also previously called adult onset diabetes. About 90% of all those who have diabetes have type 2. It usually occurs after the age of 40 but can occur at any age. CAUSES Unlike type 1 diabetes, which happens because insulin is no longer being made, type 2 diabetes happens because the body is making less insulin and has trouble using the insulin properly. SYMPTOMS  Drinking more than usual.   Urinating more than usual.   Blurred vision.   Dry, itchy skin.   Frequent infection like yeast infections in women.   More tired than usual (fatigue).  TREATMENT  Healthy eating.   Exercise.   Medication, if needed.   Monitoring blood glucose (sugar).   Seeing your caregiver regularly.  HOME CARE INSTRUCTIONS  Check your blood glucose (sugar) at least once daily. More frequent monitoring may be necessary, depending on your medications and on how well your diabetes is controlled. Your caregiver will advise you.   Take your medicine as directed by your caregiver.   Do not smoke.   Make wise food choices. Ask your caregiver for information. Weight loss can improve your diabetes.   Learn about low blood glucose (hypoglycemia) and how to treat it.   Get your eyes checked regularly.   Have a yearly physical exam. Have your blood pressure checked. Get your blood and urine tested.   Wear a pendant or bracelet saying that you have diabetes.   Check your feet every night for sores. Let your caregiver know if you have sores that are not healing.  SEEK MEDICAL CARE IF:  You are having problems keeping your blood glucose at target range.   You feel you might be having problems with your medicines.   You have symptoms of an illness that is not improving after 24  hours.   You have a sore or wound that is not healing.   You notice a change in vision or a new problem with your vision.   You develop a fever of more than 100.5.  Document Released: 06/15/2005 Document Re-Released: 07/07/2009 [employer] Patient Information 2011 [employer]. 

## 2010-12-09 NOTE — Assessment & Plan Note (Signed)
It sounds like her blood sugars are well controlled, I will check her A1C and renal and renal function today

## 2010-12-09 NOTE — Assessment & Plan Note (Signed)
Her BP is well controlled 

## 2010-12-09 NOTE — Assessment & Plan Note (Signed)
She has a normal volume status today 

## 2010-12-09 NOTE — Assessment & Plan Note (Signed)
Check her K+ level today

## 2010-12-09 NOTE — Progress Notes (Signed)
Subjective:    Patient ID: Brittany [name], female    DOB: Jul 05, 1928, 77 y.o.   MRN: [id number]  Diabetes She presents for her follow-up diabetic visit. She has type 2 diabetes mellitus. Her disease course has been improving. There are no hypoglycemic associated symptoms. Pertinent negatives for diabetes include no blurred vision, no chest pain, no fatigue, no foot paresthesias, no foot ulcerations, no polydipsia, no polyphagia, no polyuria, no visual change, no weakness and no weight loss. There are no hypoglycemic complications. Symptoms are stable. Pertinent negatives for diabetic complications include no autonomic neuropathy. Current diabetic treatment includes oral agent (monotherapy). She is compliant with treatment all of the time. Her weight is stable. She is following a generally healthy diet. Meal planning includes avoidance of concentrated sweets. She has not had a previous visit with a dietician. She participates in exercise intermittently. Her home blood glucose trend is decreasing steadily. Her breakfast blood glucose range is generally 90-110 mg/dl. Her lunch blood glucose range is generally 90-110 mg/dl. Her dinner blood glucose range is generally 90-110 mg/dl. Her highest blood glucose is 110-130 mg/dl. Her overall blood glucose range is 90-110 mg/dl. An ACE inhibitor/angiotensin II receptor blocker is not being taken. She does not see a podiatrist.Eye exam is current.      Review of Systems  Constitutional: Negative.  Negative for weight loss and fatigue.  HENT: Negative.   Eyes: Negative for blurred vision.  Respiratory: Negative.   Cardiovascular: Negative.  Negative for chest pain.  Gastrointestinal: Negative.   Genitourinary: Negative.  Negative for polyuria.  Musculoskeletal: Negative.   Neurological: Negative.  Negative for weakness.  Hematological: Negative.  Negative for polydipsia and polyphagia.  Psychiatric/Behavioral: Negative.        Objective:   Physical Exam   Vitals reviewed. Constitutional: She is oriented to person, place, and time. She appears well-developed and well-nourished. No distress.  HENT:  Head: Normocephalic and atraumatic.  Right Ear: External ear normal.  Left Ear: External ear normal.  Nose: Nose normal.  Mouth/Throat: Oropharynx is clear and moist. No oropharyngeal exudate.  Eyes: Conjunctivae and EOM are normal. Pupils are equal, round, and reactive to light. Right eye exhibits no discharge. Left eye exhibits no discharge. No scleral icterus.  Neck: Normal range of motion. Neck supple. No JVD present. No tracheal deviation present. No thyromegaly present.  Cardiovascular: Normal rate, regular rhythm, normal heart sounds and intact distal pulses.  Exam reveals no gallop and no friction rub.   No murmur heard. Pulmonary/Chest: Effort normal and breath sounds normal. No stridor. No respiratory distress. She has no wheezes. She has no rales. She exhibits no tenderness.  Abdominal: Soft. Bowel sounds are normal. She exhibits no distension and no mass. There is no tenderness. There is no rebound and no guarding.  Musculoskeletal: Normal range of motion. She exhibits edema (trace edema in both legs). She exhibits no tenderness.  Lymphadenopathy:    She has no cervical adenopathy.  Neurological: She is alert and oriented to person, place, and time. She has normal reflexes. No cranial nerve deficit. Coordination normal.  Skin: Skin is warm and dry. No rash noted. She is not diaphoretic. No erythema. No pallor.  Psychiatric: She has a normal mood and affect. Her behavior is normal. Judgment and thought content normal.        Lab Results  Component Value Date   WBC 4.1* 09/23/2010   HGB 12.7 09/23/2010   HCT 37.5 09/23/2010   PLT 309.0 09/23/2010   CHOL  116 05/27/2010   TRIG 53.0 05/27/2010   HDL 59.70 05/27/2010   LDLDIRECT 119.8 04/02/2008   ALT 19 09/23/2010   AST 22 09/23/2010   NA 141 09/23/2010   K 4.2 09/23/2010   CL 109  09/23/2010   CREATININE 0.7 09/23/2010   BUN 10 09/23/2010   CO2 26 09/23/2010   TSH 1.99 05/27/2010   HGBA1C 6.5 09/23/2010   MICROALBUR 0.3 04/18/2009    Assessment & Plan:

## 2010-12-10 ENCOUNTER — Encounter: Payer: Self-pay | Admitting: Internal Medicine

## 2010-12-16 ENCOUNTER — Other Ambulatory Visit: Payer: Self-pay | Admitting: Internal Medicine

## 2011-01-27 ENCOUNTER — Other Ambulatory Visit: Payer: Self-pay | Admitting: Oncology

## 2011-01-27 ENCOUNTER — Encounter (HOSPITAL_BASED_OUTPATIENT_CLINIC_OR_DEPARTMENT_OTHER): Payer: Medicare Other | Admitting: Oncology

## 2011-01-27 DIAGNOSIS — Z170 Estrogen receptor positive status [ER+]: Secondary | ICD-10-CM

## 2011-01-27 DIAGNOSIS — C50519 Malignant neoplasm of lower-outer quadrant of unspecified female breast: Secondary | ICD-10-CM

## 2011-01-27 LAB — COMPREHENSIVE METABOLIC PANEL
ALT: 18 U/L (ref 0–35)
CO2: 23 mEq/L (ref 19–32)
Calcium: 9.4 mg/dL (ref 8.4–10.5)
Chloride: 104 mEq/L (ref 96–112)
Creatinine, Ser: 0.72 mg/dL (ref 0.50–1.10)
Glucose, Bld: 89 mg/dL (ref 70–99)
Total Bilirubin: 0.2 mg/dL — ABNORMAL LOW (ref 0.3–1.2)

## 2011-01-27 LAB — CBC WITH DIFFERENTIAL/PLATELET
BASO%: 0.4 % (ref 0.0–2.0)
Basophils Absolute: 0 10*3/uL (ref 0.0–0.1)
Eosinophils Absolute: 0.1 10*3/uL (ref 0.0–0.5)
HCT: 36.9 % (ref 34.8–46.6)
HGB: 12.2 g/dL (ref 11.6–15.9)
LYMPH%: 43.5 % (ref 14.0–49.7)
MONO#: 0.4 10*3/uL (ref 0.1–0.9)
NEUT#: 2.5 10*3/uL (ref 1.5–6.5)
NEUT%: 46.1 % (ref 38.4–76.8)
Platelets: 268 10*3/uL (ref 145–400)
WBC: 5.5 10*3/uL (ref 3.9–10.3)
lymph#: 2.4 10*3/uL (ref 0.9–3.3)

## 2011-01-27 LAB — LACTATE DEHYDROGENASE: LDH: 264 U/L — ABNORMAL HIGH (ref 94–250)

## 2011-01-28 LAB — CANCER ANTIGEN 27.29: CA 27.29: 23 U/mL (ref 0–39)

## 2011-02-03 ENCOUNTER — Encounter: Payer: Medicare Other | Admitting: Oncology

## 2011-02-05 ENCOUNTER — Telehealth: Payer: Self-pay

## 2011-02-05 NOTE — Telephone Encounter (Signed)
LMOVM for pt to pick up samples

## 2011-02-10 LAB — HM DEXA SCAN: HM Dexa Scan: -3.1

## 2011-02-23 ENCOUNTER — Encounter (INDEPENDENT_AMBULATORY_CARE_PROVIDER_SITE_OTHER): Payer: Self-pay | Admitting: Surgery

## 2011-02-24 ENCOUNTER — Ambulatory Visit
Admission: RE | Admit: 2011-02-24 | Discharge: 2011-02-24 | Disposition: A | Discharge status: Home / Self Care | Payer: Medicare Other | Source: Ambulatory Visit | Attending: Oncology | Admitting: Oncology

## 2011-02-24 ENCOUNTER — Encounter (INDEPENDENT_AMBULATORY_CARE_PROVIDER_SITE_OTHER): Payer: Self-pay | Admitting: Surgery

## 2011-02-24 ENCOUNTER — Ambulatory Visit (INDEPENDENT_AMBULATORY_CARE_PROVIDER_SITE_OTHER): Payer: Medicare Other | Admitting: Surgery

## 2011-02-24 VITALS — BP 138/88 | HR 80

## 2011-02-24 DIAGNOSIS — Z853 Personal history of malignant neoplasm of breast: Secondary | ICD-10-CM

## 2011-02-24 NOTE — Progress Notes (Signed)
02/24/2011  Brittany [name] is a 77 y.o.female who presents for routine followup of her Right breast cancerdiagnosed in 2008 and treated with anti-estrogen therapy, lumpectomy, radiation. She has no problems or concerns on either side.  PFSH She has had no significant changes since the last visit here.  ROS There have been no significant changes since the last visit here  General: The patient is alert, oriented, generally healty appearing, NAD. Mood and affect are normal.  Breasts: Basically symmetric with no significant radiation change on the right. There is no evidence of new cancer or recurrence Lymphatics: She has no axillary or supraclavicular adenopathy on either side.  Extremities: Full ROM of the surgical side with no lymphedema noted.  Data Reviewed: Mammogram in March was Negative - reviewed Dr Steva Colder notes  Impression: Doing well, with no evidence of recurrent cancer or new cancer  Plan: Will continue to follow up on an annual basis here.

## 2011-04-13 ENCOUNTER — Other Ambulatory Visit: Payer: Self-pay | Admitting: Internal Medicine

## 2011-04-14 ENCOUNTER — Encounter: Payer: Self-pay | Admitting: Internal Medicine

## 2011-04-14 ENCOUNTER — Other Ambulatory Visit (INDEPENDENT_AMBULATORY_CARE_PROVIDER_SITE_OTHER): Payer: Medicare Other

## 2011-04-14 ENCOUNTER — Ambulatory Visit (INDEPENDENT_AMBULATORY_CARE_PROVIDER_SITE_OTHER): Payer: Medicare Other | Admitting: Internal Medicine

## 2011-04-14 DIAGNOSIS — E119 Type 2 diabetes mellitus without complications: Secondary | ICD-10-CM

## 2011-04-14 DIAGNOSIS — I10 Essential (primary) hypertension: Secondary | ICD-10-CM

## 2011-04-14 DIAGNOSIS — E785 Hyperlipidemia, unspecified: Secondary | ICD-10-CM

## 2011-04-14 DIAGNOSIS — J309 Allergic rhinitis, unspecified: Secondary | ICD-10-CM

## 2011-04-14 LAB — COMPREHENSIVE METABOLIC PANEL
AST: 25 U/L (ref 0–37)
Alkaline Phosphatase: 63 U/L (ref 39–117)
BUN: 14 mg/dL (ref 6–23)
Calcium: 8.9 mg/dL (ref 8.4–10.5)
Chloride: 109 mEq/L (ref 96–112)
Creatinine, Ser: 0.7 mg/dL (ref 0.4–1.2)
Total Bilirubin: 0.5 mg/dL (ref 0.3–1.2)

## 2011-04-14 NOTE — Patient Instructions (Signed)
Diabetes, Type 2 Diabetes is a lasting (chronic) disease. In type 2 diabetes, the pancreas does not make enough insulin (a hormone), and the body does not respond normally to the insulin that is made. This type of diabetes was also previously called adult onset diabetes. About 90% of all those who have diabetes have type 2. It usually occurs after the age of 40 but can occur at any age. CAUSES Unlike type 1 diabetes, which happens because insulin is no longer being made, type 2 diabetes happens because the body is making less insulin and has trouble using the insulin properly. SYMPTOMS  Drinking more than usual.   Urinating more than usual.   Blurred vision.   Dry, itchy skin.   Frequent infection like yeast infections in women.   More tired than usual (fatigue).  TREATMENT  Healthy eating.   Exercise.   Medication, if needed.   Monitoring blood glucose (sugar).   Seeing your caregiver regularly.  HOME CARE INSTRUCTIONS  Check your blood glucose (sugar) at least once daily. More frequent monitoring may be necessary, depending on your medications and on how well your diabetes is controlled. Your caregiver will advise you.   Take your medicine as directed by your caregiver.   Do not smoke.   Make wise food choices. Ask your caregiver for information. Weight loss can improve your diabetes.   Learn about low blood glucose (hypoglycemia) and how to treat it.   Get your eyes checked regularly.   Have a yearly physical exam. Have your blood pressure checked. Get your blood and urine tested.   Wear a pendant or bracelet saying that you have diabetes.   Check your feet every night for sores. Let your caregiver know if you have sores that are not healing.  SEEK MEDICAL CARE IF:  You are having problems keeping your blood glucose at target range.   You feel you might be having problems with your medicines.   You have symptoms of an illness that is not improving after 24  hours.   You have a sore or wound that is not healing.   You notice a change in vision or a new problem with your vision.   You develop a fever of more than 100.5.  Document Released: 06/15/2005 Document Re-Released: 07/07/2009 [employer] Patient Information 2011 [employer]. 

## 2011-04-14 NOTE — Progress Notes (Signed)
Subjective:    Patient ID: Brittany [name], female    DOB: 1929/05/13, 77 y.o.   MRN: [id number]  Diabetes She presents for her follow-up diabetic visit. She has type 2 diabetes mellitus. Her disease course has been stable. There are no hypoglycemic associated symptoms. Pertinent negatives for hypoglycemia include no dizziness, headaches, pallor, seizures, speech difficulty or tremors. Pertinent negatives for diabetes include no blurred vision, no chest pain, no fatigue, no foot paresthesias, no foot ulcerations, no polydipsia, no polyphagia, no polyuria, no visual change, no weakness and no weight loss. There are no hypoglycemic complications. Symptoms are stable. There are no diabetic complications. Current diabetic treatment includes oral agent (monotherapy). She is compliant with treatment all of the time. Her weight is stable. She is following a generally healthy diet. Meal planning includes avoidance of concentrated sweets. She has not had a previous visit with a dietician. She participates in exercise intermittently. There is no change in her home blood glucose trend. An ACE inhibitor/angiotensin II receptor blocker is not being taken. She does not see a podiatrist.Eye exam is current.      Review of Systems  Constitutional: Negative for fever, chills, weight loss, diaphoresis, activity change, appetite change, fatigue and unexpected weight change.  HENT: Positive for congestion, rhinorrhea, sneezing and postnasal drip. Negative for hearing loss, ear pain, nosebleeds, sore throat, facial swelling, drooling, mouth sores, trouble swallowing, neck pain, neck stiffness, dental problem, sinus pressure, tinnitus and ear discharge.   Eyes: Negative.  Negative for blurred vision.  Respiratory: Negative for apnea, cough, choking, chest tightness, shortness of breath, wheezing and stridor.   Cardiovascular: Negative for chest pain, palpitations and leg swelling.  Gastrointestinal: Negative for nausea,  vomiting, abdominal pain, diarrhea, constipation, blood in stool, abdominal distention, anal bleeding and rectal pain.  Genitourinary: Negative for dysuria, urgency, polyuria, frequency, hematuria, flank pain, decreased urine volume, enuresis, difficulty urinating and dyspareunia.  Musculoskeletal: Negative for myalgias, back pain, joint swelling, arthralgias and gait problem.  Skin: Negative for color change, pallor, rash and wound.  Neurological: Negative for dizziness, tremors, seizures, syncope, facial asymmetry, speech difficulty, weakness, light-headedness, numbness and headaches.  Hematological: Negative for polydipsia, polyphagia and adenopathy. Does not bruise/bleed easily.  Psychiatric/Behavioral: Negative.        Objective:   Physical Exam  Vitals reviewed. Constitutional: She is oriented to person, place, and time. She appears well-developed and well-nourished. No distress.  HENT:  Head: Normocephalic.  Right Ear: Hearing, tympanic membrane, external ear and ear canal normal.  Left Ear: Hearing, tympanic membrane, external ear and ear canal normal.  Nose: Mucosal edema and rhinorrhea present. No nose lacerations, sinus tenderness, nasal deformity, septal deviation or nasal septal hematoma. No epistaxis.  No foreign bodies. Right sinus exhibits no maxillary sinus tenderness and no frontal sinus tenderness. Left sinus exhibits no maxillary sinus tenderness and no frontal sinus tenderness.  Mouth/Throat: Uvula is midline, oropharynx is clear and moist and mucous membranes are normal. Mucous membranes are not pale, not dry and not cyanotic. No oropharyngeal exudate, posterior oropharyngeal edema, posterior oropharyngeal erythema or tonsillar abscesses.  Eyes: Conjunctivae and EOM are normal. Pupils are equal, round, and reactive to light. Right eye exhibits no discharge. Left eye exhibits no discharge. No scleral icterus.  Neck: Normal range of motion. Neck supple. No JVD present. No  tracheal deviation present. No thyromegaly present.  Cardiovascular: Normal rate, regular rhythm, normal heart sounds and intact distal pulses.  Exam reveals no gallop and no friction rub.   No  murmur heard. Pulmonary/Chest: Effort normal and breath sounds normal. No stridor. No respiratory distress. She has no wheezes. She has no rales. She exhibits no tenderness.  Abdominal: Soft. Bowel sounds are normal. She exhibits no distension and no mass. There is no tenderness. There is no rebound and no guarding.  Musculoskeletal: Normal range of motion. She exhibits no edema and no tenderness.  Lymphadenopathy:    She has no cervical adenopathy.  Neurological: She is oriented to person, place, and time. She displays normal reflexes. No cranial nerve deficit. She exhibits normal muscle tone. Coordination normal.  Skin: Skin is warm and dry. No rash noted. She is not diaphoretic. No erythema. No pallor.  Psychiatric: She has a normal mood and affect. Her behavior is normal. Judgment and thought content normal.      Lab Results  Component Value Date   WBC 4.1* 09/23/2010   HGB 12.2 01/27/2011   HCT 36.9 01/27/2011   PLT 268 01/27/2011   GLUCOSE 89 01/27/2011   CHOL 180 12/09/2010   TRIG 65.0 12/09/2010   HDL 59.50 12/09/2010   LDLDIRECT 119.8 04/02/2008   LDLCALC 108* 12/09/2010   ALT 18 01/27/2011   AST 22 01/27/2011   NA 137 01/27/2011   K 3.8 01/27/2011   CL 104 01/27/2011   CREATININE 0.72 01/27/2011   BUN 8 01/27/2011   CO2 23 01/27/2011   TSH 2.19 12/09/2010   HGBA1C 6.5 12/09/2010   MICROALBUR 0.3 04/18/2009      Assessment & Plan:

## 2011-04-15 ENCOUNTER — Encounter: Payer: Self-pay | Admitting: Internal Medicine

## 2011-04-15 DIAGNOSIS — J309 Allergic rhinitis, unspecified: Secondary | ICD-10-CM | POA: Insufficient documentation

## 2011-04-15 MED ORDER — CETIRIZINE HCL 10 MG PO TABS: 10.0000 mg | ORAL_TABLET | Freq: Every day | ORAL | Status: DC

## 2011-04-15 NOTE — Assessment & Plan Note (Signed)
She is doing well on livalo 

## 2011-04-15 NOTE — Assessment & Plan Note (Signed)
I will check her A1C today and will monitor her renal function 

## 2011-04-15 NOTE — Assessment & Plan Note (Signed)
Her BP is well controlled 

## 2011-04-25 ENCOUNTER — Other Ambulatory Visit: Payer: Self-pay | Admitting: Internal Medicine

## 2011-08-10 ENCOUNTER — Other Ambulatory Visit: Payer: Self-pay | Admitting: Oncology

## 2011-08-10 DIAGNOSIS — Z853 Personal history of malignant neoplasm of breast: Secondary | ICD-10-CM

## 2011-08-11 ENCOUNTER — Ambulatory Visit (INDEPENDENT_AMBULATORY_CARE_PROVIDER_SITE_OTHER): Payer: Medicare Other | Admitting: Internal Medicine

## 2011-08-11 ENCOUNTER — Encounter: Payer: Self-pay | Admitting: Internal Medicine

## 2011-08-11 ENCOUNTER — Other Ambulatory Visit (INDEPENDENT_AMBULATORY_CARE_PROVIDER_SITE_OTHER): Payer: Medicare Other

## 2011-08-11 DIAGNOSIS — I10 Essential (primary) hypertension: Secondary | ICD-10-CM

## 2011-08-11 DIAGNOSIS — E119 Type 2 diabetes mellitus without complications: Secondary | ICD-10-CM

## 2011-08-11 DIAGNOSIS — I509 Heart failure, unspecified: Secondary | ICD-10-CM

## 2011-08-11 DIAGNOSIS — E785 Hyperlipidemia, unspecified: Secondary | ICD-10-CM

## 2011-08-11 LAB — LIPID PANEL
Cholesterol: 170 mg/dL (ref 0–200)
HDL: 65.5 mg/dL (ref >39.00)
LDL Cholesterol: 91 mg/dL (ref 0–99)
Total CHOL/HDL Ratio: 3
Triglycerides: 68 mg/dL (ref 0.0–149.0)

## 2011-08-11 LAB — COMPREHENSIVE METABOLIC PANEL
ALT: 18 U/L (ref 0–35)
AST: 24 U/L (ref 0–37)
Alkaline Phosphatase: 64 U/L (ref 39–117)
Calcium: 9.2 mg/dL (ref 8.4–10.5)
Chloride: 106 mEq/L (ref 96–112)
Creatinine, Ser: 0.8 mg/dL (ref 0.4–1.2)
Potassium: 3.8 mEq/L (ref 3.5–5.1)

## 2011-08-11 LAB — HEMOGLOBIN A1C: Hgb A1c MFr Bld: 6.4 % (ref 4.6–6.5)

## 2011-08-11 LAB — CBC WITH DIFFERENTIAL/PLATELET
Basophils Absolute: 0 10*3/uL (ref 0.0–0.1)
Eosinophils Absolute: 0.1 10*3/uL (ref 0.0–0.7)
Lymphocytes Relative: 35.2 % (ref 12.0–46.0)
Lymphs Abs: 1.7 10*3/uL (ref 0.7–4.0)
MCHC: 33 g/dL (ref 30.0–36.0)
Monocytes Relative: 8.3 % (ref 3.0–12.0)
Platelets: 318 10*3/uL (ref 150.0–400.0)
RDW: 13.5 % (ref 11.5–14.6)

## 2011-08-11 LAB — TSH: TSH: 2.87 u[IU]/mL (ref 0.35–5.50)

## 2011-08-11 MED ORDER — AMLODIPINE-OLMESARTAN 5-40 MG PO TABS: 1.0000 | ORAL_TABLET | Freq: Every day | ORAL | Status: DC

## 2011-08-11 NOTE — Assessment & Plan Note (Signed)
Her BP is not well controlled so I have added Benicar, I will check her lytes and renal function today

## 2011-08-11 NOTE — Assessment & Plan Note (Signed)
Check a1c and renal function today

## 2011-08-11 NOTE — Patient Instructions (Signed)

## 2011-08-11 NOTE — Progress Notes (Signed)
Subjective:    Patient ID: Brittany [name], female    DOB: [birth year], 78 y.o.   MRN: [id number]  Hypertension This is a chronic problem. The current episode started more than 1 year ago. The problem has been gradually worsening since onset. The problem is uncontrolled. Pertinent negatives include no anxiety, blurred vision, chest pain, headaches, malaise/fatigue, neck pain, orthopnea, palpitations, peripheral edema, PND, shortness of breath or sweats. Past treatments include calcium channel blockers and diuretics. The current treatment provides moderate improvement. Compliance problems include exercise and diet.  Hypertensive end-organ damage includes heart failure.  Diabetes She presents for her follow-up diabetic visit. She has type 2 diabetes mellitus. Her disease course has been fluctuating. There are no hypoglycemic associated symptoms. Pertinent negatives for hypoglycemia include no dizziness, headaches, seizures, speech difficulty, sweats or tremors. Pertinent negatives for diabetes include no blurred vision, no chest pain, no fatigue, no foot paresthesias, no foot ulcerations, no polydipsia, no polyphagia, no polyuria, no visual change, no weakness and no weight loss. There are no hypoglycemic complications. Symptoms are stable. There are no diabetic complications. Current diabetic treatment includes oral agent (monotherapy). She is compliant with treatment all of the time. Her weight is stable. Meal planning includes avoidance of concentrated sweets. She has not had a previous visit with a dietician. She never participates in exercise. There is no change in her home blood glucose trend. An ACE inhibitor/angiotensin II receptor blocker is not being taken. She does not see a podiatrist.Eye exam is current.      Review of Systems  Constitutional: Negative for fever, chills, weight loss, malaise/fatigue, diaphoresis, activity change, appetite change, fatigue and unexpected weight change.  HENT:  Negative.  Negative for neck pain.   Eyes: Negative.  Negative for blurred vision.  Respiratory: Negative for apnea, cough, choking, chest tightness, shortness of breath, wheezing and stridor.   Cardiovascular: Negative for chest pain, palpitations, orthopnea, leg swelling and PND.  Gastrointestinal: Negative.   Genitourinary: Negative.  Negative for polyuria.  Musculoskeletal: Negative for myalgias, back pain, joint swelling, arthralgias and gait problem.  Skin: Negative.   Neurological: Negative for dizziness, tremors, seizures, syncope, facial asymmetry, speech difficulty, weakness, light-headedness, numbness and headaches.  Hematological: Negative for polydipsia, polyphagia and adenopathy. Does not bruise/bleed easily.  Psychiatric/Behavioral: Negative.        Objective:   Physical Exam  Vitals reviewed. Constitutional: She is oriented to person, place, and time. She appears well-developed and well-nourished. No distress.  HENT:  Head: Normocephalic and atraumatic.  Mouth/Throat: Oropharynx is clear and moist. No oropharyngeal exudate.  Eyes: Conjunctivae are normal. Right eye exhibits no discharge. Left eye exhibits no discharge. No scleral icterus.  Neck: Normal range of motion. Neck supple. No JVD present. No tracheal deviation present. No thyromegaly present.  Cardiovascular: Normal rate, regular rhythm, normal heart sounds and intact distal pulses.  Exam reveals no gallop and no friction rub.   No murmur heard. Pulmonary/Chest: Effort normal and breath sounds normal. No respiratory distress. She has no wheezes. She has no rales. She exhibits no tenderness.  Abdominal: Soft. Bowel sounds are normal. She exhibits no distension and no mass. There is no tenderness. There is no rebound and no guarding.  Musculoskeletal: Normal range of motion. She exhibits no edema and no tenderness.  Lymphadenopathy:    She has no cervical adenopathy.  Neurological: She is oriented to person,  place, and time.  Skin: Skin is warm and dry. No rash noted. She is not diaphoretic. No erythema. No  pallor.  Psychiatric: She has a normal mood and affect. Her behavior is normal. Judgment and thought content normal.      Lab Results  Component Value Date   WBC 5.5 01/27/2011   HGB 12.2 01/27/2011   HCT 36.9 01/27/2011   PLT 268 01/27/2011   GLUCOSE 103* 04/14/2011   CHOL 180 12/09/2010   TRIG 65.0 12/09/2010   HDL 59.50 12/09/2010   LDLDIRECT 119.8 04/02/2008   LDLCALC 108* 12/09/2010   ALT 19 04/14/2011   AST 25 04/14/2011   NA 140 04/14/2011   K 3.5 04/14/2011   CL 109 04/14/2011   CREATININE 0.7 04/14/2011   BUN 14 04/14/2011   CO2 24 04/14/2011   TSH 2.19 12/09/2010   HGBA1C 6.2 04/14/2011   MICROALBUR 0.3 04/18/2009      Assessment & Plan:

## 2011-08-11 NOTE — Assessment & Plan Note (Signed)
She does not think she has CHF, I have asked her to see cardiology for a re-eval of her heart

## 2011-08-11 NOTE — Assessment & Plan Note (Signed)
She is doing well on livalo 

## 2011-08-17 ENCOUNTER — Other Ambulatory Visit: Payer: Self-pay

## 2011-08-17 ENCOUNTER — Other Ambulatory Visit: Payer: Self-pay | Admitting: *Deleted

## 2011-08-17 DIAGNOSIS — C50919 Malignant neoplasm of unspecified site of unspecified female breast: Secondary | ICD-10-CM

## 2011-08-17 DIAGNOSIS — E785 Hyperlipidemia, unspecified: Secondary | ICD-10-CM

## 2011-08-17 DIAGNOSIS — E119 Type 2 diabetes mellitus without complications: Secondary | ICD-10-CM

## 2011-08-17 DIAGNOSIS — I10 Essential (primary) hypertension: Secondary | ICD-10-CM

## 2011-08-17 MED ORDER — TAMOXIFEN CITRATE 20 MG PO TABS: 20.0000 mg | ORAL_TABLET | Freq: Every day | ORAL | Status: DC

## 2011-08-17 NOTE — Progress Notes (Signed)
Received refill request from [facility] for pt's tamoxifen.  Per last MD note 02/03/11, pt to be seen in 6 months to determine whether she can be switched from tamoxifen to an AI.  Pt currently does not have f/u visit scheduled.  Per Dr. Donnie Coffin, pt to come in to be seen, and ok to refill tamoxifen x 1 until then.  Onc tx schedule sent to schedulers for pt to have labs and f/u MD visit as soon as possible, and to call pt with appt.

## 2011-08-25 ENCOUNTER — Institutional Professional Consult (permissible substitution): Payer: Medicare Other | Admitting: Cardiology

## 2011-08-26 ENCOUNTER — Encounter: Payer: Self-pay | Admitting: Cardiology

## 2011-08-26 ENCOUNTER — Ambulatory Visit (INDEPENDENT_AMBULATORY_CARE_PROVIDER_SITE_OTHER): Payer: Medicare Other | Admitting: Cardiology

## 2011-08-26 VITALS — BP 142/84 | HR 96 | Ht 59.0 in | Wt 186.0 lb

## 2011-08-26 DIAGNOSIS — E119 Type 2 diabetes mellitus without complications: Secondary | ICD-10-CM

## 2011-08-26 DIAGNOSIS — E7800 Pure hypercholesterolemia, unspecified: Secondary | ICD-10-CM

## 2011-08-26 DIAGNOSIS — I10 Essential (primary) hypertension: Secondary | ICD-10-CM

## 2011-08-26 NOTE — Assessment & Plan Note (Addendum)
Blood pressure was initially evaluated but on repeat exam had improved. I agree with recent change to Azor. She appears to be of appropriate therapy. I really see nothing in her history to suggest congestive heart failure. Her exam is benign. She had a normal echocardiogram in 2009. Since she is asymptomatic I see no reason for further cardiac testing. I would continue to focus on risk factor modification. We discussed appropriate lifestyle modification including regular aerobic exercise and a heart healthy diet. I will see her back as needed.

## 2011-08-26 NOTE — Progress Notes (Signed)
Brittany [name] Date of Birth: 04/13/1929 Medical Record #[id number]  History of Present Illness: Brittany [name] is a very pleasant 78 year old white female referred by Dr. Yetta Barre for evaluation hypertension and possible heart failure. She she has no history of prior cardiac disease. She does have multiple cardiac risk factors including history of hypertension, hyperlipidemia, and diabetes. She denies any symptoms of chest pain, shortness of breath, palpitations, or edema. She remains very active. Is still working as a Lawyer. Prior cardiac evaluation includes an echocardiogram in 2009 which was normal. Chest x-ray at that time suggested cardiomegaly. She has no history of heart attack and has not had ischemic evaluation in the past. Her mother did die at age 21 of a myocardial infarction.  Current Outpatient Prescriptions on File Prior to Visit  Medication Sig Dispense Refill  . amLODipine-olmesartan (AZOR) 5-40 MG per tablet Take 1 tablet by mouth daily.  112 tablet  0  . aspirin EC 81 MG EC tablet Take 81 mg by mouth daily.        . cetirizine (ZYRTEC) 10 MG tablet Take 1 tablet (10 mg total) by mouth daily.  30 tablet  11  . Cholecalciferol 1000 UNIT tablet Take 1,000 Units by mouth daily.        . furosemide (LASIX) 20 MG tablet TAKE ONE (1) TABLET EACH DAY  90 tablet  3  . Glucosamine 500 MG TABS Take by mouth.        . metFORMIN (GLUMETZA) 500 MG (MOD) 24 hr tablet Take 500 mg by mouth daily.        . Pitavastatin Calcium (LIVALO) 4 MG TABS Take 1 tablet (4 mg total) by mouth daily.  700 tablet  0  . potassium chloride (KLOR-CON) 20 MEQ packet Take 20 mEq by mouth 3 (three) times daily.        . tamoxifen (NOLVADEX) 20 MG tablet Take 1 tablet (20 mg total) by mouth daily.  30 tablet  0    Allergies  Allergen Reactions  . Alendronate Sodium     REACTION: heartburn  . Lisinopril     REACTION: dizziness    Past Medical History  Diagnosis Date  . Hypertension   . Osteoporosis   .  Hyperlipidemia   . Positive PPD     history off  . Diverticulosis of colon   . DJD (degenerative joint disease) of knee     Left  . Cancer     breast  . Diabetes mellitus     type 2. 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    Past Surgical History  Procedure Date  . Breast surgery 2010    + RT    History  Smoking status  . Never Smoker   Smokeless tobacco  . Not on file    History  Alcohol Use  . 1.2 oz/week  . 2 Glasses of wine per week    Family History  Problem Relation Age of Onset  . Diabetes      1st degree relative  . Hypertension    . Stroke    . Cancer      cervical  . Lupus    . Coronary artery disease    . Heart disease Mother   . Diabetes Father     Review of Systems: The review of systems is positive for elevated blood pressure. She was recently started on Azor. She had breast cancer diagnosed 2 years ago and underwent lumpectomy and radiation  therapy. She is on chronic tamoxifen. All other systems were reviewed and are negative.  Physical Exam: BP 142/84  Pulse 96  Ht 4\' 11"  (1.499 m)  Wt 186 lb (84.369 kg)  BMI 37.57 kg/m2 Is very pleasant, obese white female who appears younger than her stated age. Normocephalic, atraumatic. Pupils are equal round and reactive. Sclera clear. Oropharynx is clear with some missing dentition. Neck is supple without JVD, adenopathy, or thyromegaly. No bruits. Lungs are clear. Cardiac exam reveals a regular rate and rhythm without gallop, murmur, or click. Abdomen is soft and nontender. She has no masses or bruits. She has no lower extremity edema. Pedal pulses are excellent. Skin is warm and dry. She is alert and oriented x3. Cranial nerves II through XII are intact. LABORATORY DATA: ECG today demonstrates normal sinus rhythm with a normal ECG.  Assessment / Plan:

## 2011-08-26 NOTE — Patient Instructions (Signed)
Continue on your current medication.  I will see you back as needed.

## 2011-08-27 ENCOUNTER — Telehealth: Payer: Self-pay | Admitting: Oncology

## 2011-08-27 NOTE — Telephone Encounter (Signed)
lmonvm adviisng the pt of her march 2013 appts °

## 2011-09-01 ENCOUNTER — Other Ambulatory Visit (HOSPITAL_BASED_OUTPATIENT_CLINIC_OR_DEPARTMENT_OTHER): Payer: Medicare Other | Admitting: Lab

## 2011-09-01 ENCOUNTER — Ambulatory Visit (HOSPITAL_BASED_OUTPATIENT_CLINIC_OR_DEPARTMENT_OTHER): Payer: Medicare Other | Admitting: Oncology

## 2011-09-01 ENCOUNTER — Telehealth: Payer: Self-pay | Admitting: Oncology

## 2011-09-01 DIAGNOSIS — C50919 Malignant neoplasm of unspecified site of unspecified female breast: Secondary | ICD-10-CM

## 2011-09-01 DIAGNOSIS — I10 Essential (primary) hypertension: Secondary | ICD-10-CM

## 2011-09-01 DIAGNOSIS — E559 Vitamin D deficiency, unspecified: Secondary | ICD-10-CM

## 2011-09-01 DIAGNOSIS — Z170 Estrogen receptor positive status [ER+]: Secondary | ICD-10-CM

## 2011-09-01 DIAGNOSIS — C50519 Malignant neoplasm of lower-outer quadrant of unspecified female breast: Secondary | ICD-10-CM

## 2011-09-01 DIAGNOSIS — M810 Age-related osteoporosis without current pathological fracture: Secondary | ICD-10-CM

## 2011-09-01 DIAGNOSIS — E119 Type 2 diabetes mellitus without complications: Secondary | ICD-10-CM

## 2011-09-01 DIAGNOSIS — E785 Hyperlipidemia, unspecified: Secondary | ICD-10-CM

## 2011-09-01 LAB — CBC WITH DIFFERENTIAL/PLATELET
Basophils Absolute: 0 10*3/uL (ref 0.0–0.1)
EOS%: 2 % (ref 0.0–7.0)
HCT: 37.6 % (ref 34.8–46.6)
HGB: 12.4 g/dL (ref 11.6–15.9)
LYMPH%: 44.7 % (ref 14.0–49.7)
MCH: 28.2 pg (ref 25.1–34.0)
NEUT%: 45.1 % (ref 38.4–76.8)
Platelets: 333 10*3/uL (ref 145–400)
lymph#: 2.2 10*3/uL (ref 0.9–3.3)

## 2011-09-01 LAB — COMPREHENSIVE METABOLIC PANEL
BUN: 9 mg/dL (ref 6–23)
CO2: 23 mEq/L (ref 19–32)
Calcium: 9.6 mg/dL (ref 8.4–10.5)
Chloride: 106 mEq/L (ref 96–112)
Creatinine, Ser: 0.8 mg/dL (ref 0.50–1.10)
Total Bilirubin: 0.1 mg/dL — ABNORMAL LOW (ref 0.3–1.2)

## 2011-09-01 MED ORDER — ALENDRONATE SODIUM 70 MG PO TABS: 70.0000 mg | ORAL_TABLET | ORAL | Status: AC

## 2011-09-01 NOTE — Progress Notes (Signed)
Hematology and Oncology Follow Up Visit  Brittany [name] [id number] Oct 12, 1928 78 y.o. 09/01/2011 3:37 PM PCP Dr Jonny Ruiz Dr [place] Principle Diagnosis: 78 year old woman with history of ER/PR positive breast cancer status post lumpectomy 10/10/2008 ration therapy completed at 12/22/2008 on tamoxifen problem 2 history of osteoporosis  Interim History:  There have been no intercurrent illness, hospitalizations or medication changes.  Medications: I have reviewed the patient's current medications.  Allergies:  Allergies  Allergen Reactions  . Alendronate Sodium     REACTION: heartburn  . Lisinopril     REACTION: dizziness    Past Medical History, Surgical history, Social history, and Family History were reviewed and updated. 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 Despite her age of 48 she is doing extraordinarily well. She has not had any intercurrent illnesses  Review of Systems: Constitutional:  Negative for fever, chills, night sweats, anorexia, weight loss, pain. Cardiovascular: no chest pain or dyspnea on exertion Respiratory: no cough, shortness of breath, or wheezing Neurological: no TIA or stroke symptoms Dermatological: negative ENT: negative Skin Gastrointestinal: negative Genito-Urinary: no dysuria, trouble voiding, or hematuria Hematological and Lymphatic: negative Breast: negative for breast lumps Musculoskeletal: negative Remaining ROS negative.  Physical Exam: Blood pressure 166/93, pulse 94, temperature 98.3 F (36.8 C), height 4\' 11"  (1.499 m), weight 186 lb 4.8 oz (84.505 kg). ECOG:  General appearance: alert, cooperative and appears stated age Head: Normocephalic, without obvious abnormality, atraumatic Neck: no adenopathy, no carotid bruit, no JVD, supple, symmetrical, trachea midline and thyroid not enlarged, symmetric, no tenderness/mass/nodules Lymph nodes: Cervical, supraclavicular, and axillary nodes normal. Cardiac : regular rate and  rhythm Pulmonary:clear to auscultation bilaterally and normal percussion bilaterally Breasts: inspection negative, no nipple discharge or bleeding, no masses or nodularity palpable Abdomen:soft, non-tender; bowel sounds normal; no masses,  no organomegaly Extremities negative Neuro: alert, oriented, normal speech, no focal findings or movement disorder noted  Lab Results: Lab Results  Component Value Date   WBC 4.9 09/01/2011   HGB 12.4 09/01/2011   HCT 37.6 09/01/2011   MCV 85.7 09/01/2011   PLT 333 09/01/2011     Chemistry      Component Value Date/Time   NA 139 08/11/2011 0905   K 3.8 08/11/2011 0905   CL 106 08/11/2011 0905   CO2 24 08/11/2011 0905   BUN 10 08/11/2011 0905   CREATININE 0.8 08/11/2011 0905      Component Value Date/Time   CALCIUM 9.2 08/11/2011 0905   ALKPHOS 64 08/11/2011 0905   AST 24 08/11/2011 0905   ALT 18 08/11/2011 0905   BILITOT 0.4 08/11/2011 0905      .pathology. Radiological Studies: chest X-ray n/a Mammogram Due this month Bone density Previously performed still showing evidence of osteoporosis  Impression and Plan: Pleasant postmenopausal woman with a history of T1 B. N0 breast cancer currently on tamoxifen and doing well. I recommend should take Fosamax for her osteoporosis. She'll have her mammogram later this month and I will see her in 6 months.  More than 50% of the visit was spent in patient-related counselling   Pierce Crane, MD 3/5/20133:37 PM

## 2011-09-01 NOTE — Telephone Encounter (Signed)
gve the pt her sept 2013 appt calendar °

## 2011-09-02 LAB — VITAMIN D 25 HYDROXY (VIT D DEFICIENCY, FRACTURES): Vit D, 25-Hydroxy: 65 ng/mL (ref 30–89)

## 2011-09-16 ENCOUNTER — Other Ambulatory Visit: Payer: Self-pay | Admitting: Certified Registered Nurse Anesthetist

## 2011-09-16 ENCOUNTER — Other Ambulatory Visit: Payer: Self-pay | Admitting: Internal Medicine

## 2011-09-16 DIAGNOSIS — C50919 Malignant neoplasm of unspecified site of unspecified female breast: Secondary | ICD-10-CM

## 2011-09-16 MED ORDER — TAMOXIFEN CITRATE 20 MG PO TABS: 20.0000 mg | ORAL_TABLET | Freq: Every day | ORAL | Status: DC

## 2011-09-17 ENCOUNTER — Ambulatory Visit
Admission: RE | Admit: 2011-09-17 | Discharge: 2011-09-17 | Disposition: A | Discharge status: Home / Self Care | Payer: Medicare Other | Source: Ambulatory Visit | Attending: Oncology | Admitting: Oncology

## 2011-09-17 DIAGNOSIS — Z853 Personal history of malignant neoplasm of breast: Secondary | ICD-10-CM

## 2011-10-13 ENCOUNTER — Other Ambulatory Visit: Payer: Self-pay | Admitting: *Deleted

## 2011-10-13 DIAGNOSIS — C50919 Malignant neoplasm of unspecified site of unspecified female breast: Secondary | ICD-10-CM

## 2011-10-13 MED ORDER — TAMOXIFEN CITRATE 20 MG PO TABS: 20.0000 mg | ORAL_TABLET | Freq: Every day | ORAL | Status: AC

## 2011-12-02 ENCOUNTER — Telehealth: Payer: Self-pay | Admitting: Internal Medicine

## 2011-12-02 DIAGNOSIS — I10 Essential (primary) hypertension: Secondary | ICD-10-CM

## 2011-12-02 DIAGNOSIS — E119 Type 2 diabetes mellitus without complications: Secondary | ICD-10-CM

## 2011-12-02 MED ORDER — AMLODIPINE-OLMESARTAN 5-40 MG PO TABS: 1.0000 | ORAL_TABLET | Freq: Every day | ORAL | Status: DC

## 2011-12-02 NOTE — Telephone Encounter (Signed)
Needs refill on Azor, please call into [facility] [phone]

## 2011-12-04 ENCOUNTER — Other Ambulatory Visit: Payer: Self-pay | Admitting: *Deleted

## 2011-12-04 ENCOUNTER — Telehealth: Payer: Self-pay

## 2011-12-04 DIAGNOSIS — I10 Essential (primary) hypertension: Secondary | ICD-10-CM

## 2011-12-04 DIAGNOSIS — E119 Type 2 diabetes mellitus without complications: Secondary | ICD-10-CM

## 2011-12-04 MED ORDER — AMLODIPINE-OLMESARTAN 5-40 MG PO TABS: 1.0000 | ORAL_TABLET | Freq: Every day | ORAL | Status: DC

## 2011-12-04 NOTE — Telephone Encounter (Signed)
Received pharmacy rejection stating that insurance will not cover azor without a prior authorization. PA form obtained from website and pending completion.

## 2011-12-04 NOTE — Telephone Encounter (Signed)
Form completed and faxed. 

## 2011-12-09 ENCOUNTER — Other Ambulatory Visit (INDEPENDENT_AMBULATORY_CARE_PROVIDER_SITE_OTHER): Payer: Medicare Other

## 2011-12-09 ENCOUNTER — Ambulatory Visit (INDEPENDENT_AMBULATORY_CARE_PROVIDER_SITE_OTHER): Payer: Medicare Other | Admitting: Internal Medicine

## 2011-12-09 ENCOUNTER — Encounter: Payer: Self-pay | Admitting: Internal Medicine

## 2011-12-09 VITALS — BP 128/72 | HR 80 | Temp 97.5°F | Resp 16 | Ht 60.0 in | Wt 183.0 lb

## 2011-12-09 DIAGNOSIS — E785 Hyperlipidemia, unspecified: Secondary | ICD-10-CM

## 2011-12-09 DIAGNOSIS — I10 Essential (primary) hypertension: Secondary | ICD-10-CM

## 2011-12-09 DIAGNOSIS — M810 Age-related osteoporosis without current pathological fracture: Secondary | ICD-10-CM

## 2011-12-09 DIAGNOSIS — E119 Type 2 diabetes mellitus without complications: Secondary | ICD-10-CM

## 2011-12-09 LAB — COMPREHENSIVE METABOLIC PANEL
ALT: 15 U/L (ref 0–35)
AST: 19 U/L (ref 0–37)
Albumin: 3.7 g/dL (ref 3.5–5.2)
CO2: 22 mEq/L (ref 19–32)
Calcium: 9.2 mg/dL (ref 8.4–10.5)
Chloride: 110 mEq/L (ref 96–112)
GFR: 89.8 mL/min (ref >60.00)
Potassium: 3.8 mEq/L (ref 3.5–5.1)
Sodium: 141 mEq/L (ref 135–145)
Total Protein: 7.3 g/dL (ref 6.0–8.3)

## 2011-12-09 NOTE — Assessment & Plan Note (Signed)
Her BP is well controlled, I will check her lytes and renal function 

## 2011-12-09 NOTE — Assessment & Plan Note (Signed)
I will check her a1c and will monitor her renal function today 

## 2011-12-09 NOTE — Patient Instructions (Signed)
Osteoporosis Osteoporosis is a disease of the bones that makes them weaker and prone to break (fracture). By their mid-30s, most people begin to gradually lose bone strength. If this is severe enough, osteoporosis may occur. Osteopenia is a less severe weakness of the bones, which places you at risk for osteoporosis. It is important to identify if you have osteoporosis or osteopenia. Bone fractures from osteoporosis (especially hip and spine fractures) are a major cause of hospitalization, loss of independence, and can lead to life-threatening complications. CAUSES  There are a number of causes and risk factors:  Gender. Women are at a higher risk for osteoporosis than men.   Age. Bone formation slows down with age.   Ethnicity. For unclear reasons, white and Asian women are at higher risk for osteoporosis. Hispanic and African American women are at increased, but lesser, risk.   Family history of osteoporosis can mean that you are at a higher risk for getting it.   History of bone fractures indicates you may be at higher risk of another.   Calcium is very important for bone health and strength. Not enough calcium in your diet increases your risk for osteoporosis. Vitamin D is important for calcium metabolism. You get vitamin D from sunlight, foods, or supplements.   Physical activity. Bones get stronger with weight-bearing exercise and weaker without use.   Smoking is associated with decreased bone strength.   Medicines. Cortisone medicines, too much thyroid medicine, some cancer and seizure medicines, and others can weaken bones and cause osteoporosis.   Decreased body weight is associated with osteoporosis. The small amount of estrogen-type molecules produced in fat cells seems to protect the bones.   Menopausal decrease in the hormone estrogen can cause osteoporosis.   Low levels of the hormone testosterone can cause osteoporosis.   Some medical conditions can lead to osteoporosis  (hyperthyroidism, hyperparathyroidism, B12 deficiency).  SYMPTOMS  Usually, no symptoms are felt as the bones weaken. The first symptoms are generally related to bone fractures. You may have silent, tiny bone fractures, especially in your spine. This can cause height loss and forward bending of the spine (kyphosis). DIAGNOSIS  You or your caregiver may suspect osteoporosis based on height loss and kyphosis. Osteoporosis or osteopenia may be identified on an X-ray done for other reasons. A bone density measurement will likely be taken. Your bones are often measured at your lower spine or your hips. Measurement is done by an X-ray called a DEXA scan, or sometimes by a computerized X-ray scan (CT or CAT scan). Other tests may be done to find the cause of osteoporosis, such as blood tests to measure calcium and vitamin D, or to monitor treatment. TREATMENT  The goal of osteoporosis treatment is to prevent fractures. This is done through medicine and home care treatments. Treatment will slow the weakening of your bones and strengthen them where possible. Measures to decrease the likelihood of falling and fracturing a bone are also important. Medicine  You may need supplements if you are not getting enough calcium, vitamin D, and vitamin B12.   If you are female and menopausal, you should discuss the option of estrogen replacement or estrogen-like medicine with your caregiver.   Medicines can be taken by mouth or injection to help build bone strength. When taken by mouth, there are important directions that you need to follow.   Calcitonin is a hormone made by the thyroid gland that can help build bone strength and decrease fracture risk in the spine. It   can be taken by nasal spray or injection.   Parathyroid hormone can be injected to help build bone strength.   You will need to continue to get enough calcium intake with any of these medicines.  FALL PREVENTION  If you are unsteady on your feet, use  a cane, walker, or walk with someone's help.   Remove loose rugs or electrical cords from your home.   Keep your home well lit at night. Use glasses if you need them.   Avoid icy streets and wet or waxed floors.   Hold the railing when using stairs.   Watch out for your pets.   Install grab bars in your bathroom.   Exercise. Physical activity, especially weight-bearing exercise, helps strengthen bones. Strength and balance exercise, such as tai chi, helps prevent falls.   Alcohol and some medicines can make you more likely to fall. Discuss alcohol use with your caregiver. Ask your caregiver if any of your medicines might increase your risk for falling. Ask if safer alternatives are available.  HOME CARE INSTRUCTIONS   Try to prevent and avoid falls.   To pick up objects, bend at the knees. Do not bend with your back.   Do not smoke. If you smoke, ask for help to stop.   Have adequate calcium and vitamin D in your diet. Talk with your caregiver about amounts.   Before exercising, ask your caregiver what exercises will be good for you.   Only take over-the-counter or prescription medicines for pain, discomfort, or fever as directed by your caregiver.  SEEK MEDICAL CARE IF:   You have had a fracture and your pain is not controlled.   You have had a fracture and you are not able to return to activities as expected.   You are reinjured.   You develop side effects from medicines, especially stomach pain or trouble swallowing.   You develop new, unexplained problems.  SEEK IMMEDIATE MEDICAL CARE IF:   You develop sudden, severe pain in your back.   You develop pain after an injury or fall.  Document Released: 03/25/2005 Document Revised: 06/04/2011 Document Reviewed: 05/30/2011 [facility] Patient Information 2012 [place], [place].Diabetes, Type 2 Diabetes is a long-lasting (chronic) disease. In type 2 diabetes, the pancreas does not make enough insulin (a hormone), and the body  does not respond normally to the insulin that is made. This type of diabetes was also previously called adult-onset diabetes. It usually occurs after the age of 55, but it can occur at any age.  CAUSES  Type 2 diabetes happens because the pancreasis not making enough insulin or your body has trouble using the insulin that your pancreas does make properly. SYMPTOMS   Drinking more than usual.   Urinating more than usual.   Blurred vision.   Dry, itchy skin.   Frequent infections.   Feeling more tired than usual (fatigue).  DIAGNOSIS The diagnosis of type 2 diabetes is usually made by one of the following tests:  Fasting blood glucose test. You will not eat for at least 8 hours and then take a blood test.   Random blood glucose test. Your blood glucose (sugar) is checked at any time of the day regardless of when you ate.   Oral glucose tolerance test (OGTT). Your blood glucose is measured after you have not eaten (fasted) and then after you drink a glucose containing beverage.  TREATMENT   Healthy eating.   Exercise.   Medicine, if needed.   Monitoring blood glucose.  Seeing your caregiver regularly.  HOME CARE INSTRUCTIONS   Check your blood glucose at least once a day. More frequent monitoring may be necessary, depending on your medicines and on how well your diabetes is controlled. Your caregiver will advise you.   Take your medicine as directed by your caregiver.   Do not smoke.   Make wise food choices. Ask your caregiver for information. Weight loss can improve your diabetes.   Learn about low blood glucose (hypoglycemia) and how to treat it.   Get your eyes checked regularly.   Have a yearly physical exam. Have your blood pressure checked and your blood and urine tested.   Wear a pendant or bracelet saying that you have diabetes.   Check your feet every night for cuts, sores, blisters, and redness. Let your caregiver know if you have any problems.  SEEK  MEDICAL CARE IF:   You have problems keeping your blood glucose in target range.   You have problems with your medicines.   You have symptoms of an illness that do not improve after 24 hours.   You have a sore or wound that is not healing.   You notice a change in vision or a new problem with your vision.   You have a fever.  MAKE SURE YOU:  Understand these instructions.   Will watch your condition.   Will get help right away if you are not doing well or get worse.  Document Released: 06/15/2005 Document Revised: 06/04/2011 Document Reviewed: 12/01/2010 [facility] Patient Information 2012 [place], [place].

## 2011-12-09 NOTE — Progress Notes (Signed)
Subjective:    Patient ID: Brittany [name], female    DOB: 04-30-1929, 78 y.o.   MRN: [id number]  Hypertension This is a chronic problem. The current episode started more than 1 year ago. The problem has been gradually improving since onset. The problem is controlled. Pertinent negatives include no anxiety, blurred vision, chest pain, headaches, malaise/fatigue, neck pain, orthopnea, palpitations, peripheral edema, PND, shortness of breath or sweats. Past treatments include angiotensin blockers, calcium channel blockers and diuretics. The current treatment provides significant improvement. Compliance problems include exercise and diet.   Diabetes She presents for her follow-up diabetic visit. She has type 2 diabetes mellitus. Her disease course has been stable. There are no hypoglycemic associated symptoms. Pertinent negatives for hypoglycemia include no headaches or sweats. Pertinent negatives for diabetes include no blurred vision, no chest pain, no fatigue, no foot paresthesias, no foot ulcerations, no polydipsia, no polyphagia, no polyuria, no visual change, no weakness and no weight loss. There are no hypoglycemic complications. Symptoms are stable. There are no diabetic complications. Current diabetic treatment includes oral agent (monotherapy). She is compliant with treatment all of the time. Her weight is stable. She is following a generally healthy diet. Meal planning includes avoidance of concentrated sweets. She participates in exercise intermittently. There is no change in her home blood glucose trend. An ACE inhibitor/angiotensin II receptor blocker is being taken. She does not see a podiatrist.Eye exam is current.      Review of Systems  Constitutional: Negative.  Negative for weight loss, malaise/fatigue and fatigue.  HENT: Negative.  Negative for neck pain.   Eyes: Negative.  Negative for blurred vision.  Respiratory: Negative.  Negative for shortness of breath.   Cardiovascular:  Negative.  Negative for chest pain, palpitations, orthopnea and PND.  Gastrointestinal: Negative.   Genitourinary: Negative.  Negative for polyuria.  Musculoskeletal: Negative.   Skin: Negative.   Neurological: Negative.  Negative for weakness and headaches.  Hematological: Negative.  Negative for polydipsia and polyphagia.  Psychiatric/Behavioral: Negative.        Objective:   Physical Exam  Vitals reviewed. Constitutional: She is oriented to person, place, and time. She appears well-developed and well-nourished. No distress.  HENT:  Head: Normocephalic and atraumatic.  Mouth/Throat: Oropharynx is clear and moist. No oropharyngeal exudate.  Eyes: Conjunctivae are normal. Right eye exhibits no discharge. Left eye exhibits no discharge. No scleral icterus.  Neck: Normal range of motion. Neck supple. No JVD present. No tracheal deviation present. No thyromegaly present.  Cardiovascular: Normal rate, regular rhythm, normal heart sounds and intact distal pulses.  Exam reveals no gallop and no friction rub.   No murmur heard. Pulmonary/Chest: Effort normal and breath sounds normal. No stridor. No respiratory distress. She has no wheezes. She has no rales. She exhibits no tenderness.  Abdominal: Soft. Bowel sounds are normal. She exhibits no distension and no mass. There is no tenderness. There is no rebound and no guarding.  Musculoskeletal: Normal range of motion. She exhibits no edema and no tenderness.  Lymphadenopathy:    She has no cervical adenopathy.  Neurological: She is oriented to person, place, and time.  Skin: Skin is warm and dry. No rash noted. She is not diaphoretic. No erythema. No pallor.  Psychiatric: She has a normal mood and affect. Her behavior is normal. Judgment and thought content normal.     Lab Results  Component Value Date   WBC 4.9 09/01/2011   HGB 12.4 09/01/2011   HCT 37.6 09/01/2011  PLT 333 09/01/2011   GLUCOSE 90 09/01/2011   CHOL 170 08/11/2011   TRIG 68.0  08/11/2011   HDL 65.50 08/11/2011   LDLDIRECT 119.8 04/02/2008   LDLCALC 91 08/11/2011   ALT 15 09/01/2011   AST 22 09/01/2011   NA 138 09/01/2011   K 4.0 09/01/2011   CL 106 09/01/2011   CREATININE 0.80 09/01/2011   BUN 9 09/01/2011   CO2 23 09/01/2011   TSH 2.87 08/11/2011   HGBA1C 6.4 08/11/2011   MICROALBUR 0.3 04/18/2009       Assessment & Plan:

## 2011-12-09 NOTE — Assessment & Plan Note (Signed)
She is doing well on livalo 

## 2011-12-24 ENCOUNTER — Other Ambulatory Visit: Payer: Self-pay | Admitting: Internal Medicine

## 2012-02-03 ENCOUNTER — Telehealth: Payer: Self-pay

## 2012-02-03 MED ORDER — AMLODIPINE BESYLATE 10 MG PO TABS: ORAL_TABLET | ORAL | Status: DC

## 2012-02-03 NOTE — Telephone Encounter (Signed)
Patient stopped by c/o headaches caused by azor and would like to know if MD would consider switching back to norvasc, Spoke with MD who approved switch back to Norvasc, RX sent to pharmacy

## 2012-03-08 ENCOUNTER — Ambulatory Visit (HOSPITAL_BASED_OUTPATIENT_CLINIC_OR_DEPARTMENT_OTHER): Payer: Medicare Other | Admitting: Oncology

## 2012-03-08 ENCOUNTER — Telehealth: Payer: Self-pay | Admitting: *Deleted

## 2012-03-08 ENCOUNTER — Other Ambulatory Visit (HOSPITAL_BASED_OUTPATIENT_CLINIC_OR_DEPARTMENT_OTHER): Payer: Medicare Other | Admitting: Lab

## 2012-03-08 ENCOUNTER — Encounter: Payer: Self-pay | Admitting: Oncology

## 2012-03-08 VITALS — BP 159/82 | HR 84 | Temp 98.0°F | Resp 20 | Ht 60.0 in | Wt 185.9 lb

## 2012-03-08 DIAGNOSIS — M810 Age-related osteoporosis without current pathological fracture: Secondary | ICD-10-CM

## 2012-03-08 DIAGNOSIS — C50919 Malignant neoplasm of unspecified site of unspecified female breast: Secondary | ICD-10-CM

## 2012-03-08 DIAGNOSIS — Z170 Estrogen receptor positive status [ER+]: Secondary | ICD-10-CM

## 2012-03-08 DIAGNOSIS — E559 Vitamin D deficiency, unspecified: Secondary | ICD-10-CM

## 2012-03-08 LAB — COMPREHENSIVE METABOLIC PANEL (CC13)
ALT: 15 U/L (ref 0–55)
CO2: 22 mEq/L (ref 22–29)
Calcium: 9.3 mg/dL (ref 8.4–10.4)
Chloride: 108 mEq/L — ABNORMAL HIGH (ref 98–107)
Sodium: 140 mEq/L (ref 136–145)
Total Protein: 6.9 g/dL (ref 6.4–8.3)

## 2012-03-08 LAB — CBC WITH DIFFERENTIAL/PLATELET
EOS%: 2.8 % (ref 0.0–7.0)
Eosinophils Absolute: 0.1 10*3/uL (ref 0.0–0.5)
LYMPH%: 45.1 % (ref 14.0–49.7)
MCH: 29.3 pg (ref 25.1–34.0)
MCV: 86.9 fL (ref 79.5–101.0)
MONO%: 9.1 % (ref 0.0–14.0)
NEUT#: 1.9 10*3/uL (ref 1.5–6.5)
Platelets: 310 10*3/uL (ref 145–400)
RBC: 4.22 10*6/uL (ref 3.70–5.45)
RDW: 13.4 % (ref 11.2–14.5)

## 2012-03-08 NOTE — Telephone Encounter (Signed)
Gave patient appointment for 09-09-2012 starting at 8:30am

## 2012-03-08 NOTE — Progress Notes (Signed)
Hematology and Oncology Follow Up Visit  Brittany [name] [id number] [birth year] 78 y.o. 03/08/2012 9:09 AM PCP Dr Jonny Ruiz Dr [place] Principle Diagnosis: 78 year old woman with history of ER/PR positive breast cancer status post lumpectomy 10/10/2008 radiation therapy completed at 12/22/2008 on tamoxifen  problem 2 history of osteoporosis  Interim History:  There have been no intercurrent illness, hospitalizations or medication changes.  Medications: I have reviewed the patient's current medications.  Allergies:  Allergies  Allergen Reactions  . Alendronate Sodium     REACTION: heartburn  . Lisinopril     REACTION: dizziness    Past Medical History, Surgical history, Social history, and Family History were reviewed and updated. 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 Despite her age of 54 she is doing extraordinarily well. She has not had any intercurrent illnesses  Review of Systems: Constitutional:  Negative for fever, chills, night sweats, anorexia, weight loss, pain. Cardiovascular: no chest pain or dyspnea on exertion Respiratory: no cough, shortness of breath, or wheezing Neurological: no TIA or stroke symptoms Dermatological: negative ENT: negative Skin Gastrointestinal: negative Genito-Urinary: no dysuria, trouble voiding, or hematuria Hematological and Lymphatic: negative Breast: negative for breast lumps Musculoskeletal: negative Remaining ROS negative.  Physical Exam: Blood pressure 159/82, pulse 84, temperature 98 F (36.7 C), resp. rate 20, height 5' (1.524 m), weight 185 lb 14.4 oz (84.324 kg). ECOG: 0 General appearance: alert, cooperative and appears stated age Head: Normocephalic, without obvious abnormality, atraumatic Neck: no adenopathy, no carotid bruit, no JVD, supple, symmetrical, trachea midline and thyroid not enlarged, symmetric, no tenderness/mass/nodules Lymph nodes: Cervical, supraclavicular, and axillary nodes normal. Cardiac :  regular rate and rhythm Pulmonary:clear to auscultation bilaterally and normal percussion bilaterally Breasts: inspection negative, no nipple discharge or bleeding, no masses or nodularity palpable Abdomen:soft, non-tender; bowel sounds normal; no masses,  no organomegaly Extremities negative Neuro: alert, oriented, normal speech, no focal findings or movement disorder noted  Lab Results: Lab Results  Component Value Date   WBC 4.5 03/08/2012   HGB 12.4 03/08/2012   HCT 36.7 03/08/2012   MCV 86.9 03/08/2012   PLT 310 03/08/2012     Chemistry      Component Value Date/Time   NA 141 12/09/2011 0848   K 3.8 12/09/2011 0848   CL 110 12/09/2011 0848   CO2 22 12/09/2011 0848   BUN 13 12/09/2011 0848   CREATININE 0.8 12/09/2011 0848      Component Value Date/Time   CALCIUM 9.2 12/09/2011 0848   ALKPHOS 50 12/09/2011 0848   AST 19 12/09/2011 0848   ALT 15 12/09/2011 0848   BILITOT 0.3 12/09/2011 0848      .pathology. Radiological Studies: chest X-ray n/a Mammogram Due this month Bone density due 2014 Previously performed still showing evidence of osteoporosis  Impression and Plan: Pleasant postmenopausal woman with a history of T1 B. N0 breast cancer currently on tamoxifen and doing well. I will see her in 6 months time. Once again she is doing well given her advanced age her performance status is excellent. We will review her bone density results in the future make a decision about switching over to an AI.  More than 50% of the visit was spent in patient-related counselling   Pierce Crane, MD 9/10/20139:09 AM

## 2012-03-09 LAB — VITAMIN D 25 HYDROXY (VIT D DEFICIENCY, FRACTURES): Vit D, 25-Hydroxy: 62 ng/mL (ref 30–89)

## 2012-03-11 ENCOUNTER — Encounter (INDEPENDENT_AMBULATORY_CARE_PROVIDER_SITE_OTHER): Payer: Self-pay | Admitting: Surgery

## 2012-03-11 ENCOUNTER — Ambulatory Visit (INDEPENDENT_AMBULATORY_CARE_PROVIDER_SITE_OTHER): Payer: Medicare Other | Admitting: Surgery

## 2012-03-11 VITALS — BP 128/88 | HR 84 | Temp 97.2°F | Resp 18 | Ht <= 58 in | Wt 185.2 lb

## 2012-03-11 DIAGNOSIS — Z853 Personal history of malignant neoplasm of breast: Secondary | ICD-10-CM

## 2012-03-11 NOTE — Progress Notes (Signed)
03/11/2012  Brittany [name] is a 78 y.o.female who presents for routine followup of her Right breast cancerdiagnosed in 2008 and treated with anti-estrogen therapy, then lumpectomy, radiation. She has no problems or concerns on either side.  PFSH She has had no significant changes since the last visit here.  ROS There have been no significant changes since the last visit here  General: The patient is alert, oriented, generally healty appearing, NAD. Mood and affect are normal.  Breasts: Basically symmetric with no significant radiation change on the right. There is no evidence of new cancer or recurrence Lymphatics: She has no axillary or supraclavicular adenopathy on either side.  Extremities: Full ROM of the surgical side with no lymphedema noted.  Data Reviewed: Mammogram: DIGITAL DIAGNOSTIC BILATERAL MAMMOGRAM WITH CAD  Comparison: July 13, 2006, August 04, 2007, September 20, 2008  Findings: CC and MLO views of bilateral breasts and spot  tangential view right breast are submitted. Postsurgical changes  are identified within the right breast. No suspicious abnormality  is identified in both breast.  Mammographic images were processed with CAD.  IMPRESSION:  Benign findings, recommend bilateral diagnostic mammogram in 1  year.  BI-RADS CATEGORY 2: Benign finding(s).  Original Report Authenticated By: Sherian Rein, M.D.    Impression: Doing well, with no evidence of recurrent cancer or new cancer  Plan: She can follow up here prn

## 2012-03-11 NOTE — Patient Instructions (Signed)
Continue annual mammograms We will see you again on an as needed basis. Please call the office at [phone] if you have any questions or concerns. Thank you for allowing us to take care of you.  

## 2012-03-14 ENCOUNTER — Other Ambulatory Visit: Payer: Self-pay | Admitting: *Deleted

## 2012-03-14 MED ORDER — TAMOXIFEN CITRATE 20 MG PO TABS: 20.0000 mg | ORAL_TABLET | Freq: Every day | ORAL | Status: DC

## 2012-03-27 ENCOUNTER — Encounter (HOSPITAL_BASED_OUTPATIENT_CLINIC_OR_DEPARTMENT_OTHER): Payer: Self-pay | Admitting: Emergency Medicine

## 2012-03-27 ENCOUNTER — Emergency Department (HOSPITAL_BASED_OUTPATIENT_CLINIC_OR_DEPARTMENT_OTHER): Payer: Medicare Other

## 2012-03-27 ENCOUNTER — Emergency Department (HOSPITAL_BASED_OUTPATIENT_CLINIC_OR_DEPARTMENT_OTHER): Admission: EM | Admit: 2012-03-27 | Payer: Medicare Other | Source: Home / Self Care

## 2012-03-27 MED ORDER — TRAMADOL HCL 50 MG PO TABS
50.0000 mg | ORAL_TABLET | Freq: Once | ORAL | Status: AC
  Administered 2012-03-27: 50 mg via ORAL
  Filled 2012-03-27: qty 1

## 2012-03-27 NOTE — ED Notes (Signed)
States has neuropathy "all over".  Feet and legs very swollen but she says that normal but her legs were not wrapped this am and she hasn't had her pain meds

## 2012-04-12 ENCOUNTER — Other Ambulatory Visit (INDEPENDENT_AMBULATORY_CARE_PROVIDER_SITE_OTHER): Payer: Medicare Other

## 2012-04-12 ENCOUNTER — Encounter: Payer: Self-pay | Admitting: Internal Medicine

## 2012-04-12 ENCOUNTER — Ambulatory Visit (INDEPENDENT_AMBULATORY_CARE_PROVIDER_SITE_OTHER): Payer: Medicare Other | Admitting: Internal Medicine

## 2012-04-12 VITALS — BP 150/90 | HR 91 | Temp 98.3°F | Resp 16 | Wt 185.8 lb

## 2012-04-12 DIAGNOSIS — E119 Type 2 diabetes mellitus without complications: Secondary | ICD-10-CM

## 2012-04-12 DIAGNOSIS — E785 Hyperlipidemia, unspecified: Secondary | ICD-10-CM

## 2012-04-12 DIAGNOSIS — J309 Allergic rhinitis, unspecified: Secondary | ICD-10-CM

## 2012-04-12 DIAGNOSIS — I10 Essential (primary) hypertension: Secondary | ICD-10-CM

## 2012-04-12 LAB — BASIC METABOLIC PANEL
BUN: 13 mg/dL (ref 6–23)
Creatinine, Ser: 0.8 mg/dL (ref 0.4–1.2)
GFR: 91.05 mL/min (ref >60.00)
Glucose, Bld: 102 mg/dL — ABNORMAL HIGH (ref 70–99)

## 2012-04-12 LAB — HEMOGLOBIN A1C: Hgb A1c MFr Bld: 6.2 % (ref 4.6–6.5)

## 2012-04-12 MED ORDER — CETIRIZINE HCL 10 MG PO TABS: 10.0000 mg | ORAL_TABLET | Freq: Every day | ORAL | Status: DC

## 2012-04-12 MED ORDER — NEBIVOLOL HCL 5 MG PO TABS
5.0000 mg | ORAL_TABLET | Freq: Every day | ORAL | Status: DC
Start: 2012-04-12 — End: 2012-04-13

## 2012-04-12 NOTE — Patient Instructions (Signed)

## 2012-04-12 NOTE — Progress Notes (Signed)
  Subjective:    Patient ID: Brittany [name], female    DOB: 1928-08-11, 78 y.o.   MRN: [id number]  Hypertension This is a chronic problem. The current episode started more than 1 year ago. The problem is unchanged. The problem is uncontrolled. Pertinent negatives include no anxiety, blurred vision, chest pain, headaches, malaise/fatigue, neck pain, orthopnea, palpitations, peripheral edema, PND, shortness of breath or sweats. Agents associated with hypertension include NSAIDs. Past treatments include calcium channel blockers and diuretics. Compliance problems include exercise and diet.       Review of Systems  Constitutional: Negative.  Negative for malaise/fatigue.  HENT: Negative.  Negative for neck pain.   Eyes: Negative.  Negative for blurred vision.  Respiratory: Negative for cough, chest tightness, shortness of breath, wheezing and stridor.   Cardiovascular: Negative for chest pain, palpitations, orthopnea, leg swelling and PND.  Gastrointestinal: Negative for nausea, vomiting, abdominal pain, diarrhea and constipation.  Genitourinary: Negative.   Musculoskeletal: Positive for arthralgias (knees). Negative for myalgias, back pain, joint swelling and gait problem.  Skin: Negative for color change, pallor, rash and wound.  Neurological: Negative.  Negative for headaches.  Hematological: Negative for adenopathy. Does not bruise/bleed easily.  Psychiatric/Behavioral: Negative.        Objective:   Physical Exam  Vitals reviewed. Constitutional: She is oriented to person, place, and time. She appears well-developed and well-nourished. No distress.  HENT:  Head: Normocephalic and atraumatic.  Mouth/Throat: Oropharynx is clear and moist. No oropharyngeal exudate.  Eyes: Conjunctivae normal are normal. Right eye exhibits no discharge. Left eye exhibits no discharge. No scleral icterus.  Neck: Normal range of motion. Neck supple. No JVD present. No tracheal deviation present. No  thyromegaly present.  Cardiovascular: Normal rate, regular rhythm and intact distal pulses.  Exam reveals no gallop and no friction rub.   Murmur heard. Pulmonary/Chest: Effort normal and breath sounds normal. No stridor. No respiratory distress. She has no wheezes. She has no rales. She exhibits no tenderness.  Abdominal: Soft. Bowel sounds are normal. She exhibits no distension and no mass. There is no tenderness. There is no rebound and no guarding.  Musculoskeletal: Normal range of motion. She exhibits no edema and no tenderness.  Lymphadenopathy:    She has no cervical adenopathy.  Neurological: She is oriented to person, place, and time.  Skin: Skin is warm and dry. No rash noted. She is not diaphoretic. No erythema. No pallor.  Psychiatric: She has a normal mood and affect. Her behavior is normal. Judgment and thought content normal.     Lab Results  Component Value Date   WBC 4.5 03/08/2012   HGB 12.4 03/08/2012   HCT 36.7 03/08/2012   PLT 310 03/08/2012   GLUCOSE 98 03/08/2012   CHOL 170 08/11/2011   TRIG 68.0 08/11/2011   HDL 65.50 08/11/2011   LDLDIRECT 119.8 04/02/2008   LDLCALC 91 08/11/2011   ALT 15 03/08/2012   AST 17 03/08/2012   NA 140 03/08/2012   K 4.0 03/08/2012   CL 108* 03/08/2012   CREATININE 0.8 03/08/2012   BUN 11.0 03/08/2012   CO2 22 03/08/2012   TSH 2.87 08/11/2011   HGBA1C 6.3 12/09/2011   MICROALBUR 0.3 04/18/2009       Assessment & Plan:

## 2012-04-14 NOTE — Assessment & Plan Note (Signed)
Her BP is not well controlled so I have added Bystolic to her antihypertensive regimen, I will check her lytes and renal function today

## 2012-04-14 NOTE — Assessment & Plan Note (Signed)
Continue zyrtec as needed

## 2012-04-14 NOTE — Assessment & Plan Note (Signed)
I will check her a1c and will monitor her renal function today 

## 2012-04-14 NOTE — Assessment & Plan Note (Signed)
She is doing well on livalo 

## 2012-04-15 ENCOUNTER — Other Ambulatory Visit: Payer: Self-pay | Admitting: Internal Medicine

## 2012-05-17 ENCOUNTER — Other Ambulatory Visit: Payer: Self-pay | Admitting: *Deleted

## 2012-05-17 DIAGNOSIS — C50919 Malignant neoplasm of unspecified site of unspecified female breast: Secondary | ICD-10-CM

## 2012-05-17 MED ORDER — TAMOXIFEN CITRATE 20 MG PO TABS: 20.0000 mg | ORAL_TABLET | Freq: Every day | ORAL | Status: DC

## 2012-05-30 ENCOUNTER — Telehealth: Payer: Self-pay | Admitting: *Deleted

## 2012-05-30 NOTE — Telephone Encounter (Signed)
Mailed out calendar to inform the patient of the new date and time in 08-2012

## 2012-06-13 ENCOUNTER — Encounter: Payer: Self-pay | Admitting: Internal Medicine

## 2012-06-13 ENCOUNTER — Ambulatory Visit (INDEPENDENT_AMBULATORY_CARE_PROVIDER_SITE_OTHER): Payer: Medicare Other | Admitting: Internal Medicine

## 2012-06-13 VITALS — BP 152/92 | HR 84 | Temp 98.2°F | Resp 16 | Wt 183.5 lb

## 2012-06-13 DIAGNOSIS — I10 Essential (primary) hypertension: Secondary | ICD-10-CM

## 2012-06-13 DIAGNOSIS — M810 Age-related osteoporosis without current pathological fracture: Secondary | ICD-10-CM

## 2012-06-13 MED ORDER — CARVEDILOL 6.25 MG PO TABS: 6.2500 mg | ORAL_TABLET | Freq: Two times a day (BID) | ORAL | Status: DC

## 2012-06-13 NOTE — Patient Instructions (Signed)

## 2012-06-13 NOTE — Progress Notes (Signed)
  Subjective:    Patient ID: Brittany [name], female    DOB: Aug 24, 1928, 78 y.o.   MRN: [id number]  Hypertension This is a chronic problem. The current episode started more than 1 year ago. The problem has been gradually worsening since onset. The problem is uncontrolled. Pertinent negatives include no anxiety, blurred vision, chest pain, headaches, malaise/fatigue, neck pain, orthopnea, palpitations, peripheral edema, PND, shortness of breath or sweats. Past treatments include diuretics and calcium channel blockers. Compliance problems include exercise, medication cost and diet.       Review of Systems  Constitutional: Negative.  Negative for malaise/fatigue and unexpected weight change.  HENT: Negative.  Negative for neck pain.   Eyes: Negative.  Negative for blurred vision.  Respiratory: Negative.  Negative for shortness of breath.   Cardiovascular: Negative for chest pain, palpitations, orthopnea and PND.  Gastrointestinal: Negative for nausea, vomiting, abdominal pain, diarrhea, constipation and blood in stool.  Genitourinary: Negative.   Musculoskeletal: Negative.  Negative for myalgias, back pain and arthralgias.  Skin: Negative.   Neurological: Negative.  Negative for dizziness, weakness, light-headedness and headaches.  Hematological: Negative.   Psychiatric/Behavioral: Negative.        Objective:   Physical Exam  Vitals reviewed. Constitutional: She is oriented to person, place, and time. She appears well-developed and well-nourished. No distress.  HENT:  Head: Normocephalic and atraumatic.  Mouth/Throat: Oropharynx is clear and moist. No oropharyngeal exudate.  Eyes: Conjunctivae normal are normal. Right eye exhibits no discharge. Left eye exhibits no discharge. No scleral icterus.  Neck: Normal range of motion. Neck supple. No JVD present. No tracheal deviation present. No thyromegaly present.  Cardiovascular: Normal rate, regular rhythm, normal heart sounds and intact  distal pulses.  Exam reveals no gallop and no friction rub.   No murmur heard. Pulmonary/Chest: Effort normal and breath sounds normal. No stridor. No respiratory distress. She has no wheezes. She has no rales. She exhibits no tenderness.  Abdominal: Soft. Bowel sounds are normal. She exhibits no distension and no mass. There is no tenderness. There is no rebound and no guarding.  Musculoskeletal: Normal range of motion. She exhibits no edema and no tenderness.  Lymphadenopathy:    She has no cervical adenopathy.  Neurological: She is oriented to person, place, and time.  Skin: Skin is warm and dry. No rash noted. She is not diaphoretic. No erythema. No pallor.  Psychiatric: She has a normal mood and affect. Her behavior is normal. Judgment and thought content normal.      Lab Results  Component Value Date   WBC 4.5 03/08/2012   HGB 12.4 03/08/2012   HCT 36.7 03/08/2012   PLT 310 03/08/2012   GLUCOSE 102* 04/12/2012   CHOL 170 08/11/2011   TRIG 68.0 08/11/2011   HDL 65.50 08/11/2011   LDLDIRECT 119.8 04/02/2008   LDLCALC 91 08/11/2011   ALT 15 03/08/2012   AST 17 03/08/2012   NA 138 04/12/2012   K 3.8 04/12/2012   CL 106 04/12/2012   CREATININE 0.8 04/12/2012   BUN 13 04/12/2012   CO2 24 04/12/2012   TSH 2.87 08/11/2011   HGBA1C 6.2 04/12/2012   MICROALBUR 0.3 04/18/2009      Assessment & Plan:

## 2012-06-13 NOTE — Assessment & Plan Note (Signed)
Her BP is too high She has not been taking bystolic because the co-pay was too high I have changed to generic coreg She will continue the other meds without any changes She will work to improve her lifestyle modifications

## 2012-06-13 NOTE — Assessment & Plan Note (Signed)
Her a1c looks great

## 2012-08-04 ENCOUNTER — Encounter: Payer: Self-pay | Admitting: Oncology

## 2012-09-07 ENCOUNTER — Other Ambulatory Visit: Payer: Medicare Other | Admitting: Lab

## 2012-09-07 ENCOUNTER — Ambulatory Visit: Payer: Medicare Other | Admitting: Oncology

## 2012-09-09 ENCOUNTER — Other Ambulatory Visit: Payer: Medicare Other | Admitting: Lab

## 2012-09-09 ENCOUNTER — Ambulatory Visit: Payer: Medicare Other | Admitting: Oncology

## 2012-09-14 ENCOUNTER — Other Ambulatory Visit: Payer: Self-pay | Admitting: *Deleted

## 2012-09-14 MED ORDER — TAMOXIFEN CITRATE 20 MG PO TABS: 20.0000 mg | ORAL_TABLET | Freq: Every day | ORAL | Status: DC

## 2012-09-19 ENCOUNTER — Ambulatory Visit
Admission: RE | Admit: 2012-09-19 | Discharge: 2012-09-19 | Disposition: A | Discharge status: Home / Self Care | Payer: Medicare Other | Source: Ambulatory Visit | Attending: Oncology | Admitting: Oncology

## 2012-09-19 DIAGNOSIS — E559 Vitamin D deficiency, unspecified: Secondary | ICD-10-CM

## 2012-09-19 DIAGNOSIS — C50919 Malignant neoplasm of unspecified site of unspecified female breast: Secondary | ICD-10-CM

## 2012-09-20 ENCOUNTER — Ambulatory Visit: Payer: Medicare Other | Admitting: Oncology

## 2012-09-20 ENCOUNTER — Other Ambulatory Visit: Payer: Medicare Other | Admitting: Lab

## 2012-09-21 ENCOUNTER — Other Ambulatory Visit: Payer: Self-pay | Admitting: Oncology

## 2012-09-21 DIAGNOSIS — C50919 Malignant neoplasm of unspecified site of unspecified female breast: Secondary | ICD-10-CM

## 2012-09-28 ENCOUNTER — Telehealth: Payer: Self-pay | Admitting: *Deleted

## 2012-09-28 ENCOUNTER — Encounter: Payer: Self-pay | Admitting: Adult Health

## 2012-09-28 ENCOUNTER — Ambulatory Visit (HOSPITAL_BASED_OUTPATIENT_CLINIC_OR_DEPARTMENT_OTHER): Payer: Medicare Other | Admitting: Adult Health

## 2012-09-28 ENCOUNTER — Other Ambulatory Visit: Payer: Medicare Other | Admitting: Lab

## 2012-09-28 ENCOUNTER — Ambulatory Visit: Payer: Medicare Other | Admitting: Adult Health

## 2012-09-28 ENCOUNTER — Ambulatory Visit (HOSPITAL_BASED_OUTPATIENT_CLINIC_OR_DEPARTMENT_OTHER): Payer: Medicare Other | Admitting: Lab

## 2012-09-28 VITALS — BP 162/95 | HR 85 | Temp 98.0°F | Resp 20 | Ht <= 58 in | Wt 184.5 lb

## 2012-09-28 DIAGNOSIS — Z170 Estrogen receptor positive status [ER+]: Secondary | ICD-10-CM

## 2012-09-28 DIAGNOSIS — C50519 Malignant neoplasm of lower-outer quadrant of unspecified female breast: Secondary | ICD-10-CM

## 2012-09-28 DIAGNOSIS — Z853 Personal history of malignant neoplasm of breast: Secondary | ICD-10-CM

## 2012-09-28 DIAGNOSIS — M810 Age-related osteoporosis without current pathological fracture: Secondary | ICD-10-CM

## 2012-09-28 DIAGNOSIS — E559 Vitamin D deficiency, unspecified: Secondary | ICD-10-CM

## 2012-09-28 LAB — CBC WITH DIFFERENTIAL/PLATELET
BASO%: 0.6 % (ref 0.0–2.0)
EOS%: 3.4 % (ref 0.0–7.0)
HCT: 38 % (ref 34.8–46.6)
MCH: 28.4 pg (ref 25.1–34.0)
MCHC: 33.5 g/dL (ref 31.5–36.0)
MCV: 84.7 fL (ref 79.5–101.0)
MONO%: 8.4 % (ref 0.0–14.0)
NEUT%: 43.2 % (ref 38.4–76.8)
RDW: 13.9 % (ref 11.2–14.5)
lymph#: 2.1 10*3/uL (ref 0.9–3.3)

## 2012-09-28 LAB — COMPREHENSIVE METABOLIC PANEL (CC13)
AST: 21 U/L (ref 5–34)
Albumin: 3.3 g/dL — ABNORMAL LOW (ref 3.5–5.0)
Alkaline Phosphatase: 67 U/L (ref 40–150)
BUN: 8.6 mg/dL (ref 7.0–26.0)
Creatinine: 0.8 mg/dL (ref 0.6–1.1)
Glucose: 112 mg/dl — ABNORMAL HIGH (ref 70–99)
Potassium: 3.8 mEq/L (ref 3.5–5.1)
Total Bilirubin: 0.23 mg/dL (ref 0.20–1.20)

## 2012-09-28 NOTE — Patient Instructions (Addendum)
Doing well.  Continue Tamoxifen.  Your bone density is due in August 2014.  Please let us know if you need [place] to order this.  Please call us if you have any questions or concerns.

## 2012-09-28 NOTE — Progress Notes (Signed)
OFFICE PROGRESS NOTE  CC**  Brittany Linger, MD [address] [facility] [address] [place] [address]  DIAGNOSIS: 79 year old female with stage IA, ER positive, PR positive, HER-2/neu negative invasive ductal carcinoma of the right breast.   PRIOR THERAPY:  1.  Ms Seward Meth had a screening mammogram in 06/2006 that saw an abnormality in the right breast.  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  08/01/06 an MRI showed a 1.5 x 1.3 x 0.7 cm mass in the lower outer quadrant of the right breast.  She did not have surgery at that time due to her sick son and husband and she says also she was in denial that she in fact had cancer.   2. On 09/20/08 she had repeat diagnostic mammo that continued to show the right breast mass.  She underwent a lumpectomy on 10/10/08 by Dr. Jamey Ripa that demonstrated a 1.2 cm ER 100%, PR 86%, HER-2/neu negative invasive ductal carcinoma.  No sentinel lymph nodes were sampled.    3.  She then underwent radiation therapy under the care of Dr. Mitzi Hansen from 11/21/2008 to 01/14/2009.    4.  She returned to see Dr. Donnie Coffin in August 2010 and was started on daily Tamoxifen.    CURRENT THERAPY: Daily Tamoxifen  INTERVAL HISTORY: Brittany [name] 79 y.o. female returns for f/u today.  She is doing well.  She and I reviewed her history of breast cancer and diagnosis as well as updated her health maintenance below.  She is taking the tamoxifen daily and denies hot flashes, joint pains, vaginal bleeding, or any other concerns.  She also has osteopetrosis and is taking Fosamax weekly and tolerates it well.  A 10 point ROS is neg.   MEDICAL HISTORY: Past Medical History  Diagnosis Date  . Hypertension   . Osteoporosis   . Hyperlipidemia   . Positive PPD     history off  . Diverticulosis of colon   . DJD (degenerative joint disease) of knee     Left  . Diabetes mellitus     type 2. 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 Cancer     right breast     ALLERGIES:  is allergic to alendronate sodium and lisinopril.  MEDICATIONS: Current Outpatient Prescriptions  Medication Sig Dispense Refill  . alendronate (FOSAMAX) 70 MG tablet       . amLODipine (NORVASC) 10 MG tablet TAKE ONE (1) TABLET EACH DAY                                                  Generic for NORVASC  90 tablet  3  . aspirin EC 81 MG EC tablet Take 81 mg by mouth daily.        . carvedilol (COREG) 6.25 MG tablet Take 1 tablet (6.25 mg total) by mouth 2 (two) times daily with a meal.  180 tablet  3  . cetirizine (ZYRTEC) 10 MG tablet Take 1 tablet (10 mg total) by mouth daily.  30 tablet  11  . Cholecalciferol 1000 UNIT tablet Take 1,000 Units by mouth daily.        . furosemide (LASIX) 20 MG tablet TAKE ONE (1) TABLET EACH DAY  GENERIC FOR LASIX  90 tablet  3  . Glucosamine 500 MG TABS Take by mouth.        . metFORMIN (GLUCOPHAGE) 500 MG tablet TAKE ONE (1) TABLET EACH DAY  90 tablet  1  . Pitavastatin Calcium (LIVALO) 4 MG TABS Take 1 tablet (4 mg total) by mouth daily.  700 tablet  0  . potassium chloride SA (K-DUR,KLOR-CON) 20 MEQ tablet TAKE ONE (1) TABLET                     THREE (3) TIMES EACH DAY  90 tablet  11  . tamoxifen (NOLVADEX) 20 MG tablet Take 1 tablet (20 mg total) by mouth daily.  30 tablet  1   No current facility-administered medications for this visit.     SURGICAL HISTORY:  Past Surgical History  Procedure Laterality Date  . Breast surgery  2010    + RT    REVIEW OF SYSTEMS:   General: fatigue (-), night sweats (-), fever (-), pain (-) Lymph: palpable nodes (-) HEENT: vision changes (-), mucositis (-), gum bleeding (-), epistaxis (-) Cardiovascular: chest pain (-), palpitations (-) Pulmonary: shortness of breath (-), dyspnea on exertion (-), cough (-), hemoptysis (-) GI:  Early satiety (-), melena (-), dysphagia (-), nausea/vomiting (-), diarrhea (-) GU: dysuria (-), hematuria (-),  incontinence (-) Musculoskeletal: joint swelling (-), joint pain (-), back pain (-) Neuro: weakness (-), numbness (-), headache (-), confusion (-) Skin: Rash (-), lesions (-), dryness (-) Psych: depression (-), suicidal/homicidal ideation (-), feeling of hopelessness (-)   HEALTH MAINTENANCE:  Mammogram 08/2012, normal Colonoscopy 7 years ago Bone Density 01/2011, osteoperosis Pap Smear unknown Eye Exam due now Vitamin D last in 02/2012, 64   PHYSICAL EXAMINATION: Blood pressure 162/95, pulse 85, temperature 98 F (36.7 C), temperature source Oral, resp. rate 20, height 4\' 10"  (1.473 m), weight 184 lb 8 oz (83.689 kg). Body mass index is 38.57 kg/(m^2). General: Patient is a well appearing female in no acute distress HEENT: PERRLA, sclerae anicteric no conjunctival pallor, MMM Neck: supple, no palpable adenopathy Lungs: clear to auscultation bilaterally, no wheezes, rhonchi, or rales Cardiovascular: regular rate rhythm, S1, S2, no murmurs, rubs or gallops Abdomen: Soft, non-tender, non-distended, normoactive bowel sounds, no HSM Extremities: warm and well perfused, no clubbing, cyanosis, or edema Skin: No rashes or lesions Neuro: Non-focal Breasts: right breast lumpectomy site without nodularity or sign of recurrence, no masses, or nodules, skin without dimpling, left breast: no masses, nodularity, or skin changes ECOG PERFORMANCE STATUS: 1 - Symptomatic but completely ambulatory   LABORATORY DATA: Lab Results  Component Value Date   WBC 4.8 09/28/2012   HGB 12.7 09/28/2012   HCT 38.0 09/28/2012   MCV 84.7 09/28/2012   PLT 299 09/28/2012      Chemistry      Component Value Date/Time   NA 141 09/28/2012 1004   NA 138 04/12/2012 0841   K 3.8 09/28/2012 1004   K 3.8 04/12/2012 0841   CL 107 09/28/2012 1004   CL 106 04/12/2012 0841   CO2 27 09/28/2012 1004   CO2 24 04/12/2012 0841   BUN 8.6 09/28/2012 1004   BUN 13 04/12/2012 0841   CREATININE 0.8 09/28/2012 1004   CREATININE 0.8  04/12/2012 0841      Component Value Date/Time   CALCIUM 9.2 09/28/2012 1004   CALCIUM 9.2 04/12/2012 0841   ALKPHOS 67 09/28/2012 1004   ALKPHOS 50 12/09/2011 0848   AST  21 09/28/2012 1004   AST 19 12/09/2011 0848   ALT 15 09/28/2012 1004   ALT 15 12/09/2011 0848   BILITOT 0.23 09/28/2012 1004   BILITOT 0.3 12/09/2011 0848       RADIOGRAPHIC STUDIES:  Mm Digital Diagnostic Bilat  09/19/2012  *RADIOLOGY REPORT*  Clinical Data:  History of right breast cancer status post lumpectomy in 2010.  DIGITAL DIAGNOSTIC BILATERAL MAMMOGRAM WITH CAD  Comparison: With priors  Findings:  ACR Breast Density Category 2: There is a scattered fibroglandular pattern.  There are no suspicious masses, malignant-type microcalcifications, or distortion.  Stable lumpectomy changes are seen in the right breast.  Mammographic images were processed with CAD.  IMPRESSION: No evidence of malignancy in either breast.  RECOMMENDATION: Diagnostic mammogram in 1 year is recommended.  I have discussed the findings and recommendations with the patient. Results were also provided in writing at the conclusion of the visit.  BI-RADS CATEGORY 2:  Benign finding(s).   Original Report Authenticated By: Baird Lyons, M.D.     ASSESSMENT:   Ms. Levi is an 79 year old female with   1. Stage IA ER positive, PR positive, HER-2/neu negative invasive ductal carcinoma of the right breast.  She has completed lumpectomy followed by radiation therapy.  She has almost completed 4 years of Tamoxifen.  We will plan on 5 years of Tamoxifen, which means she will finish in 01/2014.  2.  She also has h/o osteoporosis and is on Fosamax.  Her PCP Dr. Sanda [name] manages this.    PLAN:   1. Doing well. No sign of recurrence.  Continue Tamoxifen daily.  2. We will see her back in 6 months.     All questions were answered. The patient knows to call the clinic with any problems, questions or concerns. We can certainly see the patient much sooner if  necessary.  I spent 40 minutes counseling the patient face to face. The total time spent in the appointment was 60 minutes.  This case was reviewed with Dr. Welton Flakes.   Cherie Ouch Lyn Hollingshead, NP Medical Oncology [facility] Phone: [phone] 09/28/2012, 2:10 PM

## 2012-09-28 NOTE — Telephone Encounter (Signed)
appts made printed 

## 2012-10-12 ENCOUNTER — Ambulatory Visit (INDEPENDENT_AMBULATORY_CARE_PROVIDER_SITE_OTHER): Payer: Medicare Other | Admitting: Internal Medicine

## 2012-10-12 ENCOUNTER — Other Ambulatory Visit (INDEPENDENT_AMBULATORY_CARE_PROVIDER_SITE_OTHER): Payer: Medicare Other

## 2012-10-12 ENCOUNTER — Encounter: Payer: Self-pay | Admitting: Internal Medicine

## 2012-10-12 VITALS — BP 154/80 | HR 80 | Temp 97.1°F | Resp 16 | Ht 59.0 in | Wt 185.0 lb

## 2012-10-12 DIAGNOSIS — IMO0001 Reserved for inherently not codable concepts without codable children: Secondary | ICD-10-CM

## 2012-10-12 DIAGNOSIS — M810 Age-related osteoporosis without current pathological fracture: Secondary | ICD-10-CM

## 2012-10-12 DIAGNOSIS — I10 Essential (primary) hypertension: Secondary | ICD-10-CM

## 2012-10-12 DIAGNOSIS — E785 Hyperlipidemia, unspecified: Secondary | ICD-10-CM

## 2012-10-12 LAB — COMPREHENSIVE METABOLIC PANEL
AST: 25 U/L (ref 0–37)
Albumin: 3.6 g/dL (ref 3.5–5.2)
Alkaline Phosphatase: 62 U/L (ref 39–117)
BUN: 10 mg/dL (ref 6–23)
Creatinine, Ser: 0.7 mg/dL (ref 0.4–1.2)
Glucose, Bld: 108 mg/dL — ABNORMAL HIGH (ref 70–99)
Total Bilirubin: 0.3 mg/dL (ref 0.3–1.2)

## 2012-10-12 LAB — LIPID PANEL
Cholesterol: 173 mg/dL (ref 0–200)
HDL: 55.5 mg/dL (ref >39.00)
LDL Cholesterol: 102 mg/dL — ABNORMAL HIGH (ref 0–99)
Triglycerides: 80 mg/dL (ref 0.0–149.0)
VLDL: 16 mg/dL (ref 0.0–40.0)

## 2012-10-12 LAB — HEMOGLOBIN A1C: Hgb A1c MFr Bld: 6.4 % (ref 4.6–6.5)

## 2012-10-12 LAB — TSH: TSH: 3.24 u[IU]/mL (ref 0.35–5.50)

## 2012-10-12 NOTE — Progress Notes (Signed)
Subjective:    Patient ID: Brittany [name], female    DOB: Nov 05, 1928, 79 y.o.   MRN: [id number]  Hypertension This is a chronic problem. The current episode started more than 1 year ago. The problem has been gradually worsening since onset. The problem is uncontrolled. Associated symptoms include anxiety and peripheral edema. Pertinent negatives include no blurred vision, chest pain, headaches, malaise/fatigue, neck pain, orthopnea, palpitations, PND, shortness of breath or sweats. There are no associated agents to hypertension. Past treatments include calcium channel blockers, diuretics and beta blockers. The current treatment provides moderate improvement. Compliance problems include exercise and diet.       Review of Systems  Constitutional: Negative.  Negative for fever, chills, malaise/fatigue, diaphoresis, activity change, appetite change, fatigue and unexpected weight change.  HENT: Negative.  Negative for neck pain.   Eyes: Negative.  Negative for blurred vision.  Respiratory: Negative.  Negative for apnea, cough, choking, chest tightness, shortness of breath, wheezing and stridor.   Cardiovascular: Positive for leg swelling. Negative for chest pain, palpitations, orthopnea and PND.  Gastrointestinal: Negative.  Negative for nausea, vomiting, abdominal pain, diarrhea, constipation, abdominal distention, anal bleeding and rectal pain.  Endocrine: Negative.   Genitourinary: Negative.   Musculoskeletal: Negative.  Negative for myalgias, back pain, joint swelling and gait problem.  Skin: Negative.  Negative for color change, pallor, rash and wound.  Allergic/Immunologic: Negative.   Neurological: Negative.  Negative for dizziness, tremors, weakness, light-headedness, numbness and headaches.  Hematological: Negative.  Negative for adenopathy. Does not bruise/bleed easily.  Psychiatric/Behavioral: Negative.        Objective:   Physical Exam  Vitals reviewed. Constitutional: She is  oriented to person, place, and time. She appears well-developed and well-nourished. No distress.  HENT:  Head: Normocephalic and atraumatic.  Mouth/Throat: Oropharynx is clear and moist. No oropharyngeal exudate.  Eyes: Conjunctivae are normal. Right eye exhibits no discharge. Left eye exhibits no discharge. No scleral icterus.  Neck: Normal range of motion. Neck supple. No JVD present. No tracheal deviation present. No thyromegaly present.  Cardiovascular: Normal rate, regular rhythm, normal heart sounds and intact distal pulses.  Exam reveals no gallop and no friction rub.   No murmur heard. Pulmonary/Chest: Effort normal and breath sounds normal. No stridor. No respiratory distress. She has no wheezes. She has no rales. She exhibits no tenderness.  Abdominal: Soft. Bowel sounds are normal. She exhibits no distension and no mass. There is no tenderness. There is no rebound and no guarding.  Musculoskeletal: Normal range of motion. She exhibits edema (1+ edema in BLE). She exhibits no tenderness.  Lymphadenopathy:    She has no cervical adenopathy.  Neurological: She is oriented to person, place, and time.  Skin: Skin is warm and dry. No rash noted. She is not diaphoretic. No erythema. No pallor.  Psychiatric: She has a normal mood and affect. Her behavior is normal. Judgment and thought content normal.     Lab Results  Component Value Date   WBC 4.8 09/28/2012   HGB 12.7 09/28/2012   HCT 38.0 09/28/2012   PLT 299 09/28/2012   GLUCOSE 112* 09/28/2012   CHOL 170 08/11/2011   TRIG 68.0 08/11/2011   HDL 65.50 08/11/2011   LDLDIRECT 119.8 04/02/2008   LDLCALC 91 08/11/2011   ALT 15 09/28/2012   AST 21 09/28/2012   NA 141 09/28/2012   K 3.8 09/28/2012   CL 107 09/28/2012   CREATININE 0.8 09/28/2012   BUN 8.6 09/28/2012   CO2 27 09/28/2012  TSH 2.87 08/11/2011   HGBA1C 6.2 04/12/2012   MICROALBUR 0.3 04/18/2009       Assessment & Plan:

## 2012-10-12 NOTE — Assessment & Plan Note (Signed)
I will recheck her A1C and will address if needed I will also monitor her renal function

## 2012-10-12 NOTE — Assessment & Plan Note (Signed)
She is doing well on livalo FLP today 

## 2012-10-12 NOTE — Patient Instructions (Signed)

## 2012-10-12 NOTE — Assessment & Plan Note (Signed)
She will try to improve her lifestyle modifications, for now she will continue her current meds

## 2012-10-12 NOTE — Assessment & Plan Note (Signed)
She is due for a recheck on her DEXA scan I will recheck her Vit D level

## 2012-10-14 ENCOUNTER — Other Ambulatory Visit: Payer: Self-pay | Admitting: Internal Medicine

## 2012-10-17 ENCOUNTER — Encounter: Payer: Self-pay | Admitting: Internal Medicine

## 2012-10-17 LAB — VITAMIN D 1,25 DIHYDROXY
Vitamin D 1, 25 (OH)2 Total: 69 pg/mL (ref 18–72)
Vitamin D3 1, 25 (OH)2: 69 pg/mL

## 2012-10-26 ENCOUNTER — Other Ambulatory Visit: Payer: Medicare Other

## 2012-11-01 LAB — HM DIABETES EYE EXAM

## 2012-11-09 ENCOUNTER — Other Ambulatory Visit: Payer: Self-pay | Admitting: *Deleted

## 2012-11-09 DIAGNOSIS — C50919 Malignant neoplasm of unspecified site of unspecified female breast: Secondary | ICD-10-CM

## 2012-11-09 MED ORDER — TAMOXIFEN CITRATE 20 MG PO TABS: 20.0000 mg | ORAL_TABLET | Freq: Every day | ORAL | Status: DC

## 2012-11-14 ENCOUNTER — Other Ambulatory Visit: Payer: Self-pay | Admitting: Oncology

## 2012-12-13 ENCOUNTER — Other Ambulatory Visit: Payer: Self-pay | Admitting: *Deleted

## 2012-12-13 ENCOUNTER — Other Ambulatory Visit: Payer: Self-pay | Admitting: Internal Medicine

## 2012-12-13 DIAGNOSIS — Z853 Personal history of malignant neoplasm of breast: Secondary | ICD-10-CM

## 2012-12-13 MED ORDER — ALENDRONATE SODIUM 70 MG PO TABS: 70.0000 mg | ORAL_TABLET | ORAL | Status: DC

## 2012-12-20 ENCOUNTER — Other Ambulatory Visit: Payer: Self-pay | Admitting: Internal Medicine

## 2012-12-26 ENCOUNTER — Other Ambulatory Visit: Payer: Self-pay | Admitting: Internal Medicine

## 2013-02-09 ENCOUNTER — Encounter: Payer: Self-pay | Admitting: Internal Medicine

## 2013-02-09 ENCOUNTER — Ambulatory Visit (INDEPENDENT_AMBULATORY_CARE_PROVIDER_SITE_OTHER): Payer: Medicare Other | Admitting: Internal Medicine

## 2013-02-09 ENCOUNTER — Other Ambulatory Visit (INDEPENDENT_AMBULATORY_CARE_PROVIDER_SITE_OTHER): Payer: Medicare Other

## 2013-02-09 VITALS — BP 136/88 | HR 79 | Temp 97.1°F | Resp 16 | Ht 59.0 in | Wt 185.0 lb

## 2013-02-09 DIAGNOSIS — E785 Hyperlipidemia, unspecified: Secondary | ICD-10-CM

## 2013-02-09 DIAGNOSIS — I10 Essential (primary) hypertension: Secondary | ICD-10-CM

## 2013-02-09 LAB — LIPID PANEL
Cholesterol: 171 mg/dL (ref 0–200)
HDL: 62.3 mg/dL (ref >39.00)
Triglycerides: 103 mg/dL (ref 0.0–149.0)
VLDL: 20.6 mg/dL (ref 0.0–40.0)

## 2013-02-09 LAB — BASIC METABOLIC PANEL
BUN: 10 mg/dL (ref 6–23)
CO2: 26 mEq/L (ref 19–32)
Calcium: 9 mg/dL (ref 8.4–10.5)
Creatinine, Ser: 0.7 mg/dL (ref 0.4–1.2)
Glucose, Bld: 106 mg/dL — ABNORMAL HIGH (ref 70–99)

## 2013-02-09 NOTE — Assessment & Plan Note (Signed)
Her BP is well controlled Lytes and renal function are normal 

## 2013-02-09 NOTE — Progress Notes (Signed)
Subjective:    Patient ID: Brittany [name], female    DOB: [birth year], 79 y.o.   MRN: [id number]  Diabetes She presents for her follow-up diabetic visit. She has type 2 diabetes mellitus. Her disease course has been stable. There are no hypoglycemic associated symptoms. Pertinent negatives for hypoglycemia include no dizziness or tremors. Pertinent negatives for diabetes include no blurred vision, no chest pain, no fatigue, no foot paresthesias, no foot ulcerations, no polydipsia, no polyphagia, no polyuria, no visual change, no weakness and no weight loss. There are no hypoglycemic complications. Symptoms are stable. There are no diabetic complications. Current diabetic treatment includes oral agent (monotherapy). She is compliant with treatment all of the time. Her weight is stable. She is following a generally healthy diet. Meal planning includes avoidance of concentrated sweets. She participates in exercise intermittently. There is no change in her home blood glucose trend. An ACE inhibitor/angiotensin II receptor blocker is not being taken. She does not see a podiatrist.Eye exam is current.      Review of Systems  Constitutional: Negative.  Negative for fever, chills, weight loss, diaphoresis, appetite change and fatigue.  HENT: Negative.   Eyes: Negative.  Negative for blurred vision.  Respiratory: Negative.  Negative for cough, chest tightness, shortness of breath, wheezing and stridor.   Cardiovascular: Negative.  Negative for chest pain, palpitations and leg swelling.  Gastrointestinal: Negative.  Negative for nausea, vomiting, abdominal pain, diarrhea and constipation.  Endocrine: Negative.  Negative for polydipsia, polyphagia and polyuria.  Genitourinary: Negative.   Musculoskeletal: Negative.  Negative for myalgias, back pain, joint swelling and gait problem.  Skin: Negative.   Allergic/Immunologic: Negative.   Neurological: Negative.  Negative for dizziness, tremors, syncope,  weakness, light-headedness and numbness.  Hematological: Negative.  Negative for adenopathy. Does not bruise/bleed easily.  Psychiatric/Behavioral: Negative.        Objective:   Physical Exam  Vitals reviewed. Constitutional: She is oriented to person, place, and time. She appears well-developed and well-nourished. No distress.  HENT:  Head: Normocephalic and atraumatic.  Mouth/Throat: Oropharynx is clear and moist. No oropharyngeal exudate.  Eyes: Conjunctivae are normal. Right eye exhibits no discharge. Left eye exhibits no discharge. No scleral icterus.  Neck: Normal range of motion. Neck supple. No JVD present. No tracheal deviation present. No thyromegaly present.  Cardiovascular: Normal rate, regular rhythm, normal heart sounds and intact distal pulses.  Exam reveals no gallop and no friction rub.   No murmur heard. Pulmonary/Chest: Effort normal and breath sounds normal. No stridor. No respiratory distress. She has no wheezes. She has no rales. She exhibits no tenderness.  Abdominal: Soft. Bowel sounds are normal. She exhibits no distension and no mass. There is no tenderness. There is no rebound and no guarding.  Musculoskeletal: Normal range of motion. She exhibits edema (trace edema in BLE). She exhibits no tenderness.  Lymphadenopathy:    She has no cervical adenopathy.  Neurological: She is oriented to person, place, and time.  Skin: Skin is warm and dry. No rash noted. She is not diaphoretic. No erythema. No pallor.  Psychiatric: Her behavior is normal. Judgment and thought content normal.     Lab Results  Component Value Date   WBC 4.8 09/28/2012   HGB 12.7 09/28/2012   HCT 38.0 09/28/2012   PLT 299 09/28/2012   GLUCOSE 108* 10/12/2012   CHOL 173 10/12/2012   TRIG 80.0 10/12/2012   HDL 55.50 10/12/2012   LDLDIRECT 119.8 04/02/2008   LDLCALC 102* 10/12/2012  ALT 18 10/12/2012   AST 25 10/12/2012   NA 139 10/12/2012   K 3.8 10/12/2012   CL 107 10/12/2012   CREATININE 0.7  10/12/2012   BUN 10 10/12/2012   CO2 23 10/12/2012   TSH 3.24 10/12/2012   HGBA1C 6.4 10/12/2012   MICROALBUR 0.3 04/18/2009       Assessment & Plan:

## 2013-02-09 NOTE — Assessment & Plan Note (Signed)
Her blood sugars are well controlled 

## 2013-02-09 NOTE — Patient Instructions (Signed)

## 2013-03-30 ENCOUNTER — Telehealth: Payer: Self-pay | Admitting: *Deleted

## 2013-03-30 ENCOUNTER — Ambulatory Visit (HOSPITAL_BASED_OUTPATIENT_CLINIC_OR_DEPARTMENT_OTHER): Payer: Medicare Other | Admitting: Adult Health

## 2013-03-30 ENCOUNTER — Other Ambulatory Visit (HOSPITAL_BASED_OUTPATIENT_CLINIC_OR_DEPARTMENT_OTHER): Payer: Medicare Other | Admitting: Lab

## 2013-03-30 ENCOUNTER — Encounter: Payer: Self-pay | Admitting: Adult Health

## 2013-03-30 VITALS — BP 135/85 | HR 80 | Temp 97.7°F | Resp 20 | Ht 59.0 in | Wt 184.0 lb

## 2013-03-30 DIAGNOSIS — Z170 Estrogen receptor positive status [ER+]: Secondary | ICD-10-CM

## 2013-03-30 DIAGNOSIS — I10 Essential (primary) hypertension: Secondary | ICD-10-CM

## 2013-03-30 DIAGNOSIS — Z853 Personal history of malignant neoplasm of breast: Secondary | ICD-10-CM

## 2013-03-30 DIAGNOSIS — M810 Age-related osteoporosis without current pathological fracture: Secondary | ICD-10-CM

## 2013-03-30 DIAGNOSIS — C50519 Malignant neoplasm of lower-outer quadrant of unspecified female breast: Secondary | ICD-10-CM

## 2013-03-30 DIAGNOSIS — E559 Vitamin D deficiency, unspecified: Secondary | ICD-10-CM

## 2013-03-30 DIAGNOSIS — E119 Type 2 diabetes mellitus without complications: Secondary | ICD-10-CM

## 2013-03-30 LAB — COMPREHENSIVE METABOLIC PANEL (CC13)
ALT: 19 U/L (ref 0–55)
BUN: 9.1 mg/dL (ref 7.0–26.0)
CO2: 23 mEq/L (ref 22–29)
Calcium: 9.6 mg/dL (ref 8.4–10.4)
Chloride: 106 mEq/L (ref 98–109)
Creatinine: 0.8 mg/dL (ref 0.6–1.1)
Glucose: 106 mg/dl (ref 70–140)
Potassium: 4.2 mEq/L (ref 3.5–5.1)
Total Protein: 7.4 g/dL (ref 6.4–8.3)

## 2013-03-30 LAB — CBC WITH DIFFERENTIAL/PLATELET
Basophils Absolute: 0 10*3/uL (ref 0.0–0.1)
EOS%: 3.4 % (ref 0.0–7.0)
LYMPH%: 40.9 % (ref 14.0–49.7)
MCH: 28.5 pg (ref 25.1–34.0)
MCV: 84.9 fL (ref 79.5–101.0)
MONO%: 9.5 % (ref 0.0–14.0)
RBC: 4.52 10*6/uL (ref 3.70–5.45)
RDW: 14.1 % (ref 11.2–14.5)

## 2013-03-30 NOTE — Progress Notes (Signed)
OFFICE PROGRESS NOTE  CC**  Sanda Linger, MD [address] [facility] [address] [place], [address] [place] [address]  DIAGNOSIS: 79 year old female with stage IA, ER positive, PR positive, HER-2/neu negative invasive ductal carcinoma of the right breast.   PRIOR THERAPY:  1.  Brittany [name] had a screening mammogram in 06/2006 that saw an abnormality in the right breast.  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  08/01/06 an MRI showed a 1.5 x 1.3 x 0.7 cm mass in the lower outer quadrant of the right breast.  She did not have surgery at that time due to her sick son and husband and she says also she was in denial that she in fact had cancer.   2. On 09/20/08 she had repeat diagnostic mammo that continued to show the right breast mass.  She underwent a lumpectomy on 10/10/08 by Dr. Jamey Ripa that demonstrated a 1.2 cm ER 100%, PR 86%, HER-2/neu negative invasive ductal carcinoma.  No sentinel lymph nodes were sampled.    3.  She then underwent radiation therapy under the care of Dr. Mitzi Hansen from 11/21/2008 to 01/14/2009.    4.  She returned to see Dr. Donnie Coffin in August 2010 and was started on daily Tamoxifen.    CURRENT THERAPY: Daily Tamoxifen  INTERVAL HISTORY: Brittany [name] 79 y.o. female returns for routine f/u today.  She is doing well.  She continue take Tamoxifen daily.  She is tolerating it essentially well.  She has mild joint pain that is intermittent and at night that has been going on for a year or two.  It is not severe enough for her to take anything for it.  She also has hot flashes that mainly affect her face at night.  She agrees to tolerate them until August.  Otherwise, she is well and a 10 point ROS is neg.   MEDICAL HISTORY: Past Medical History  Diagnosis Date  . Hypertension   . Osteoporosis   . Hyperlipidemia   . Positive PPD     history off  . Diverticulosis of colon   . DJD (degenerative joint disease) of knee     Left  . Diabetes mellitus     type 2.  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 Cancer     right breast    ALLERGIES:  is allergic to alendronate sodium and lisinopril.  MEDICATIONS: Current Outpatient Prescriptions  Medication Sig Dispense Refill  . alendronate (FOSAMAX) 70 MG tablet Take 1 tablet (70 mg total) by mouth every 7 (seven) days.  8 tablet  2  . amLODipine (NORVASC) 10 MG tablet take 1 tablet by mouth once daily  90 tablet  3  . aspirin EC 81 MG EC tablet Take 81 mg by mouth daily.        . carvedilol (COREG) 6.25 MG tablet Take 1 tablet (6.25 mg total) by mouth 2 (two) times daily with a meal.  180 tablet  3  . Cholecalciferol 1000 UNIT tablet Take 1,000 Units by mouth daily.        . furosemide (LASIX) 20 MG tablet TAKE ONE (1) TABLET EACH DAY  90 tablet  3  . Glucosamine 500 MG TABS Take by mouth.        . metFORMIN (GLUCOPHAGE) 500 MG tablet TAKE ONE (1) TABLET EACH DAY  90 tablet  1  . Pitavastatin Calcium (LIVALO) 4 MG TABS Take 1 tablet (4 mg total) by mouth daily.  700 tablet  0  . potassium chloride SA (K-DUR,KLOR-CON) 20 MEQ tablet TAKE ONE (1) TABLET THREE (3) TIMES EACH DAY  90 tablet  4  . tamoxifen (NOLVADEX) 20 MG tablet Take 1 tablet (20 mg total) by mouth daily.  30 tablet  1   No current facility-administered medications for this visit.     SURGICAL HISTORY:  Past Surgical History  Procedure Laterality Date  . Breast surgery  2010    + RT    REVIEW OF SYSTEMS:   A 10 point review of systems was conducted and is otherwise negative except for what is noted above.     HEALTH MAINTENANCE:  Mammogram 08/2012, normal Colonoscopy 7 years ago Bone Density 01/2011, osteoperosis, due Pap Smear unknown Eye Exam 12/28/12 Vitamin D last pending Lipid profile: cannot recall   PHYSICAL EXAMINATION: Blood pressure 135/85, pulse 80, temperature 97.7 F (36.5 C), temperature source Oral, resp. rate 20, height 4\' 11"  (1.499 m), weight 184 lb (83.462 kg). Body mass index is 37.14  kg/(m^2). General: Patient is a well appearing female in no acute distress HEENT: PERRLA, sclerae anicteric no conjunctival pallor, MMM Neck: supple, no palpable adenopathy Lungs: clear to auscultation bilaterally, no wheezes, rhonchi, or rales Cardiovascular: regular rate rhythm, S1, S2, no murmurs, rubs or gallops Abdomen: Soft, non-tender, non-distended, normoactive bowel sounds, no HSM Extremities: warm and well perfused, no clubbing, cyanosis, or edema Skin: No rashes or lesions Neuro: Non-focal Breasts: right breast lumpectomy site without nodularity or sign of recurrence, no masses, or nodules, skin without dimpling, left breast: no masses, nodularity, or skin changes ECOG PERFORMANCE STATUS: 1 - Symptomatic but completely ambulatory   LABORATORY DATA: Lab Results  Component Value Date   WBC 4.6 03/30/2013   HGB 12.9 03/30/2013   HCT 38.4 03/30/2013   MCV 84.9 03/30/2013   PLT 307 03/30/2013      Chemistry      Component Value Date/Time   NA 139 03/30/2013 0801   NA 137 02/09/2013 0856   K 4.2 03/30/2013 0801   K 3.6 02/09/2013 0856   CL 107 02/09/2013 0856   CL 107 09/28/2012 1004   CO2 23 03/30/2013 0801   CO2 26 02/09/2013 0856   BUN 9.1 03/30/2013 0801   BUN 10 02/09/2013 0856   CREATININE 0.8 03/30/2013 0801   CREATININE 0.7 02/09/2013 0856      Component Value Date/Time   CALCIUM 9.6 03/30/2013 0801   CALCIUM 9.0 02/09/2013 0856   ALKPHOS 58 03/30/2013 0801   ALKPHOS 62 10/12/2012 0856   AST 22 03/30/2013 0801   AST 25 10/12/2012 0856   ALT 19 03/30/2013 0801   ALT 18 10/12/2012 0856   BILITOT 0.30 03/30/2013 0801   BILITOT 0.3 10/12/2012 0856       RADIOGRAPHIC STUDIES:  Mm Digital Diagnostic Bilat  09/19/2012  *RADIOLOGY REPORT*  Clinical Data:  History of right breast cancer status post lumpectomy in 2010.  DIGITAL DIAGNOSTIC BILATERAL MAMMOGRAM WITH CAD  Comparison: With priors  Findings:  ACR Breast Density Category 2: There is a scattered fibroglandular pattern.  There  are no suspicious masses, malignant-type microcalcifications, or distortion.  Stable lumpectomy changes are seen in the right breast.  Mammographic images were processed with CAD.  IMPRESSION: No evidence of malignancy in either breast.  RECOMMENDATION: Diagnostic mammogram in 1 year is recommended.  I have discussed the findings and recommendations with the patient. Results were also provided in writing at the conclusion of the visit.  BI-RADS CATEGORY 2:  Benign finding(s).   Original Report Authenticated By: Baird Lyons, M.D.     ASSESSMENT:   Brittany. Santoni is an 79 year old female with   1. Stage IA ER positive, PR positive, HER-2/neu negative invasive ductal carcinoma of the right breast.  She has completed lumpectomy followed by radiation therapy.  She has completed 4 years of Tamoxifen.  We will plan on 5 years of Tamoxifen, which means she will finish in 01/2014.  2.  She also has h/o osteoporosis and is on Fosamax.  Her PCP Dr. Sanda Linger manages this.    PLAN:   1. Doing well. No sign of recurrence.  Continue Tamoxifen daily.  We reviewed her health maintenance and discussed survivorship today.    2. I will order a repeat bone density at the patients request.    3.  She will f/u in 6 months.       All questions were answered. The patient knows to call the clinic with any problems, questions or concerns. We can certainly see the patient much sooner if necessary.  I spent 25 minutes counseling the patient face to face. The total time spent in the appointment was 30 minutes.  This case was reviewed with Dr. Welton Flakes.   Cherie Ouch Lyn Hollingshead, NP Medical Oncology [facility] Phone: [phone] 03/30/2013, 8:58 AM

## 2013-03-30 NOTE — Patient Instructions (Signed)
Doing well.  I have requested a bone density be scheduled.  Continue daily Tamoxifen, no sign of recurrence.  Proceed with your annual mammogram in March, 2015.    Please call us if you have any questions or concerns.

## 2013-03-30 NOTE — Telephone Encounter (Signed)
appts made and printed...td 

## 2013-03-31 LAB — VITAMIN D 25 HYDROXY (VIT D DEFICIENCY, FRACTURES): Vit D, 25-Hydroxy: 71 ng/mL (ref 30–89)

## 2013-04-07 ENCOUNTER — Ambulatory Visit
Admission: RE | Admit: 2013-04-07 | Discharge: 2013-04-07 | Disposition: A | Discharge status: Home / Self Care | Payer: Medicare Other | Source: Ambulatory Visit | Attending: Internal Medicine | Admitting: Internal Medicine

## 2013-04-07 DIAGNOSIS — M810 Age-related osteoporosis without current pathological fracture: Secondary | ICD-10-CM

## 2013-04-07 LAB — HM DEXA SCAN: HM Dexa Scan: -2.7

## 2013-04-12 ENCOUNTER — Other Ambulatory Visit: Payer: Self-pay | Admitting: Internal Medicine

## 2013-06-14 ENCOUNTER — Other Ambulatory Visit: Payer: Self-pay | Admitting: *Deleted

## 2013-06-14 DIAGNOSIS — C50919 Malignant neoplasm of unspecified site of unspecified female breast: Secondary | ICD-10-CM

## 2013-06-14 MED ORDER — TAMOXIFEN CITRATE 20 MG PO TABS: 20.0000 mg | ORAL_TABLET | Freq: Every day | ORAL | Status: DC

## 2013-06-14 MED ORDER — ALENDRONATE SODIUM 70 MG PO TABS: 70.0000 mg | ORAL_TABLET | ORAL | Status: DC

## 2013-07-13 ENCOUNTER — Ambulatory Visit (INDEPENDENT_AMBULATORY_CARE_PROVIDER_SITE_OTHER): Payer: Medicare Other | Admitting: Internal Medicine

## 2013-07-13 ENCOUNTER — Other Ambulatory Visit (INDEPENDENT_AMBULATORY_CARE_PROVIDER_SITE_OTHER): Payer: Medicare Other

## 2013-07-13 ENCOUNTER — Encounter: Payer: Self-pay | Admitting: Internal Medicine

## 2013-07-13 VITALS — BP 142/90 | HR 87 | Temp 97.2°F | Resp 16 | Wt 191.4 lb

## 2013-07-13 DIAGNOSIS — IMO0001 Reserved for inherently not codable concepts without codable children: Secondary | ICD-10-CM

## 2013-07-13 DIAGNOSIS — E1165 Type 2 diabetes mellitus with hyperglycemia: Principal | ICD-10-CM

## 2013-07-13 DIAGNOSIS — I10 Essential (primary) hypertension: Secondary | ICD-10-CM

## 2013-07-13 DIAGNOSIS — M810 Age-related osteoporosis without current pathological fracture: Secondary | ICD-10-CM

## 2013-07-13 LAB — BASIC METABOLIC PANEL
BUN: 11 mg/dL (ref 6–23)
CALCIUM: 9.3 mg/dL (ref 8.4–10.5)
CO2: 27 meq/L (ref 19–32)
CREATININE: 0.8 mg/dL (ref 0.4–1.2)
Chloride: 106 mEq/L (ref 96–112)
GFR: 84.49 mL/min (ref >60.00)
GLUCOSE: 105 mg/dL — AB (ref 70–99)
Potassium: 3.8 mEq/L (ref 3.5–5.1)
Sodium: 140 mEq/L (ref 135–145)

## 2013-07-13 LAB — HEMOGLOBIN A1C: HEMOGLOBIN A1C: 6.4 % (ref 4.6–6.5)

## 2013-07-13 MED ORDER — AMLODIPINE BESYLATE 10 MG PO TABS: 10.0000 mg | ORAL_TABLET | Freq: Every day | ORAL | Status: DC

## 2013-07-13 MED ORDER — AMLODIPINE-OLMESARTAN 5-20 MG PO TABS: 1.0000 | ORAL_TABLET | Freq: Every day | ORAL | Status: DC

## 2013-07-13 NOTE — Assessment & Plan Note (Signed)
Her BP is adequately well controlled 

## 2013-07-13 NOTE — Progress Notes (Signed)
Pre visit review using our clinic review tool, if applicable. No additional management support is needed unless otherwise documented below in the visit note. 

## 2013-07-13 NOTE — Assessment & Plan Note (Signed)
I will check her A1C and will address if needed I will also monitor her renal fucntion

## 2013-07-13 NOTE — Progress Notes (Signed)
   Subjective:    Patient ID: Brittany [name], female    DOB: Mar 28, 1929, 80 y.o.   MRN: [id number]  Hypertension This is a chronic problem. The current episode started more than 1 year ago. The problem is unchanged. The problem is controlled. Pertinent negatives include no anxiety, blurred vision, chest pain, headaches, malaise/fatigue, neck pain, orthopnea, palpitations, peripheral edema, PND, shortness of breath or sweats. Past treatments include beta blockers, calcium channel blockers and diuretics. The current treatment provides moderate improvement. Compliance problems include exercise and diet.       Review of Systems  Constitutional: Negative.  Negative for fever, chills, malaise/fatigue, diaphoresis, appetite change and fatigue.  HENT: Negative.   Eyes: Negative.  Negative for blurred vision.  Respiratory: Negative.  Negative for cough, choking, chest tightness, shortness of breath, wheezing and stridor.   Cardiovascular: Negative.  Negative for chest pain, palpitations, orthopnea, leg swelling and PND.  Gastrointestinal: Negative.  Negative for nausea, vomiting, abdominal pain, diarrhea, constipation and blood in stool.  Endocrine: Negative.  Negative for polydipsia, polyphagia and polyuria.  Genitourinary: Negative.   Musculoskeletal: Positive for arthralgias. Negative for back pain, myalgias, neck pain and neck stiffness.  Skin: Negative.   Allergic/Immunologic: Negative.   Neurological: Negative.  Negative for headaches.  Hematological: Negative.  Negative for adenopathy. Does not bruise/bleed easily.  Psychiatric/Behavioral: Negative.        Objective:   Physical Exam  Vitals reviewed. Constitutional: She is oriented to person, place, and time. She appears well-developed and well-nourished. No distress.  HENT:  Head: Normocephalic and atraumatic.  Mouth/Throat: Oropharynx is clear and moist. No oropharyngeal exudate.  Eyes: Conjunctivae are normal. Right eye exhibits no  discharge. Left eye exhibits no discharge. No scleral icterus.  Neck: Normal range of motion. Neck supple. No JVD present. No tracheal deviation present. No thyromegaly present.  Cardiovascular: Normal rate, regular rhythm, normal heart sounds and intact distal pulses.  Exam reveals no gallop and no friction rub.   No murmur heard. Pulmonary/Chest: Effort normal and breath sounds normal. No stridor. No respiratory distress. She has no wheezes. She has no rales. She exhibits no tenderness.  Abdominal: Soft. Bowel sounds are normal. She exhibits no distension and no mass. There is no tenderness. There is no rebound and no guarding.  Musculoskeletal: Normal range of motion. She exhibits no edema and no tenderness.  Lymphadenopathy:    She has no cervical adenopathy.  Neurological: She is oriented to person, place, and time.  Skin: Skin is warm and dry. No rash noted. She is not diaphoretic. No erythema. No pallor.  Psychiatric: She has a normal mood and affect. Her behavior is normal. Judgment and thought content normal.     Lab Results  Component Value Date   WBC 4.6 03/30/2013   HGB 12.9 03/30/2013   HCT 38.4 03/30/2013   PLT 307 03/30/2013   GLUCOSE 106 03/30/2013   CHOL 171 02/09/2013   TRIG 103.0 02/09/2013   HDL 62.30 02/09/2013   LDLDIRECT 119.8 04/02/2008   LDLCALC 88 02/09/2013   ALT 19 03/30/2013   AST 22 03/30/2013   NA 139 03/30/2013   K 4.2 03/30/2013   CL 107 02/09/2013   CREATININE 0.8 03/30/2013   BUN 9.1 03/30/2013   CO2 23 03/30/2013   TSH 3.24 10/12/2012   HGBA1C 6.5 02/09/2013   MICROALBUR 0.3 04/18/2009       Assessment & Plan:

## 2013-07-13 NOTE — Patient Instructions (Signed)

## 2013-07-13 NOTE — Assessment & Plan Note (Signed)
She will take a break from the bisphophonate therapy

## 2013-07-16 ENCOUNTER — Encounter: Payer: Self-pay | Admitting: Internal Medicine

## 2013-09-11 ENCOUNTER — Other Ambulatory Visit: Payer: Self-pay | Admitting: Internal Medicine

## 2013-09-12 ENCOUNTER — Encounter: Payer: Self-pay | Admitting: Internal Medicine

## 2013-09-12 ENCOUNTER — Ambulatory Visit (INDEPENDENT_AMBULATORY_CARE_PROVIDER_SITE_OTHER): Payer: Medicare Other | Admitting: Internal Medicine

## 2013-09-12 VITALS — BP 132/92 | HR 84 | Temp 97.5°F | Resp 16 | Ht 59.0 in | Wt 185.2 lb

## 2013-09-12 DIAGNOSIS — E1165 Type 2 diabetes mellitus with hyperglycemia: Secondary | ICD-10-CM

## 2013-09-12 DIAGNOSIS — IMO0001 Reserved for inherently not codable concepts without codable children: Secondary | ICD-10-CM

## 2013-09-12 DIAGNOSIS — I10 Essential (primary) hypertension: Secondary | ICD-10-CM

## 2013-09-12 MED ORDER — CARVEDILOL 6.25 MG PO TABS: ORAL_TABLET | ORAL | Status: DC

## 2013-09-12 NOTE — Progress Notes (Signed)
Pre visit review using our clinic review tool, if applicable. No additional management support is needed unless otherwise documented below in the visit note. 

## 2013-09-12 NOTE — Progress Notes (Signed)
Subjective:    Patient ID: Brittany [name], female    DOB: 31-Mar-1929, 80 y.o.   MRN: [id number]  Hypertension This is a chronic problem. The current episode started more than 1 year ago. The problem has been waxing and waning since onset. The problem is controlled. Pertinent negatives include no anxiety, blurred vision, chest pain, headaches, malaise/fatigue, neck pain, orthopnea, palpitations, peripheral edema, PND, shortness of breath or sweats. Past treatments include diuretics, calcium channel blockers and beta blockers. The current treatment provides moderate improvement. Compliance problems include psychosocial issues (she has been out of coreg for 4 days).       Review of Systems  Constitutional: Negative.  Negative for fever, chills, malaise/fatigue, diaphoresis, activity change, appetite change, fatigue and unexpected weight change.  HENT: Negative.   Eyes: Negative.  Negative for blurred vision.  Respiratory: Negative.  Negative for apnea, cough, choking, chest tightness, shortness of breath, wheezing and stridor.   Cardiovascular: Negative.  Negative for chest pain, palpitations, orthopnea, leg swelling and PND.  Gastrointestinal: Negative.  Negative for nausea, abdominal pain and constipation.  Endocrine: Negative.  Negative for polydipsia, polyphagia and polyuria.  Genitourinary: Negative.   Musculoskeletal: Negative.  Negative for arthralgias, back pain, neck pain and neck stiffness.  Skin: Negative.   Allergic/Immunologic: Negative.   Neurological: Negative.  Negative for dizziness, tremors, weakness and headaches.  Hematological: Negative.  Negative for adenopathy. Does not bruise/bleed easily.  Psychiatric/Behavioral: Negative.        Objective:   Physical Exam  Vitals reviewed. Constitutional: She is oriented to person, place, and time. She appears well-developed and well-nourished. No distress.  HENT:  Head: Normocephalic and atraumatic.  Mouth/Throat:  Oropharynx is clear and moist. No oropharyngeal exudate.  Eyes: Conjunctivae are normal. Right eye exhibits no discharge. Left eye exhibits no discharge. No scleral icterus.  Neck: Normal range of motion. Neck supple. No JVD present. No tracheal deviation present. No thyromegaly present.  Cardiovascular: Normal rate, regular rhythm, normal heart sounds and intact distal pulses.  Exam reveals no gallop and no friction rub.   No murmur heard. Pulmonary/Chest: Effort normal and breath sounds normal. No stridor. No respiratory distress. She has no wheezes. She has no rales. She exhibits no tenderness.  Abdominal: Soft. Bowel sounds are normal. She exhibits no distension and no mass. There is no tenderness. There is no rebound and no guarding.  Musculoskeletal: Normal range of motion. She exhibits no edema and no tenderness.  Lymphadenopathy:    She has no cervical adenopathy.  Neurological: She is oriented to person, place, and time.  Skin: Skin is warm and dry. No rash noted. She is not diaphoretic. No erythema. No pallor.  Psychiatric: She has a normal mood and affect. Her behavior is normal. Judgment and thought content normal.     Lab Results  Component Value Date   WBC 4.6 03/30/2013   HGB 12.9 03/30/2013   HCT 38.4 03/30/2013   PLT 307 03/30/2013   GLUCOSE 105* 07/13/2013   CHOL 171 02/09/2013   TRIG 103.0 02/09/2013   HDL 62.30 02/09/2013   LDLDIRECT 119.8 04/02/2008   LDLCALC 88 02/09/2013   ALT 19 03/30/2013   AST 22 03/30/2013   NA 140 07/13/2013   K 3.8 07/13/2013   CL 106 07/13/2013   CREATININE 0.8 07/13/2013   BUN 11 07/13/2013   CO2 27 07/13/2013   TSH 3.24 10/12/2012   HGBA1C 6.4 07/13/2013   MICROALBUR 0.3 04/18/2009       Assessment &  Plan:

## 2013-09-12 NOTE — Patient Instructions (Signed)

## 2013-09-13 ENCOUNTER — Encounter: Payer: Self-pay | Admitting: Internal Medicine

## 2013-09-13 ENCOUNTER — Other Ambulatory Visit: Payer: Self-pay | Admitting: Adult Health

## 2013-09-13 ENCOUNTER — Telehealth: Payer: Self-pay | Admitting: Internal Medicine

## 2013-09-13 DIAGNOSIS — Z853 Personal history of malignant neoplasm of breast: Secondary | ICD-10-CM

## 2013-09-13 NOTE — Telephone Encounter (Signed)
Relevant patient education mailed to patient.  

## 2013-09-13 NOTE — Assessment & Plan Note (Signed)
Her blood sugars are well controlled 

## 2013-09-13 NOTE — Assessment & Plan Note (Signed)
Her DBP is a little too high - she will restart coreg and will improve on her lifestyle modifications

## 2013-09-21 ENCOUNTER — Ambulatory Visit
Admission: RE | Admit: 2013-09-21 | Discharge: 2013-09-21 | Disposition: A | Discharge status: Home / Self Care | Payer: Medicare Other | Source: Ambulatory Visit | Attending: Adult Health | Admitting: Adult Health

## 2013-09-21 ENCOUNTER — Telehealth: Payer: Self-pay

## 2013-09-21 DIAGNOSIS — Z853 Personal history of malignant neoplasm of breast: Secondary | ICD-10-CM

## 2013-09-21 NOTE — Telephone Encounter (Signed)
Relevant patient education mailed to patient.  

## 2013-10-03 ENCOUNTER — Other Ambulatory Visit: Payer: Self-pay

## 2013-10-03 DIAGNOSIS — Z853 Personal history of malignant neoplasm of breast: Secondary | ICD-10-CM

## 2013-10-04 ENCOUNTER — Encounter: Payer: Self-pay | Admitting: Adult Health

## 2013-10-04 ENCOUNTER — Ambulatory Visit (HOSPITAL_BASED_OUTPATIENT_CLINIC_OR_DEPARTMENT_OTHER): Payer: Medicare Other | Admitting: Adult Health

## 2013-10-04 ENCOUNTER — Other Ambulatory Visit (HOSPITAL_BASED_OUTPATIENT_CLINIC_OR_DEPARTMENT_OTHER): Payer: Medicare Other

## 2013-10-04 VITALS — BP 173/93 | HR 91 | Temp 98.5°F | Resp 20 | Ht 59.0 in | Wt 184.1 lb

## 2013-10-04 DIAGNOSIS — C50519 Malignant neoplasm of lower-outer quadrant of unspecified female breast: Secondary | ICD-10-CM

## 2013-10-04 DIAGNOSIS — I10 Essential (primary) hypertension: Secondary | ICD-10-CM

## 2013-10-04 DIAGNOSIS — Z853 Personal history of malignant neoplasm of breast: Secondary | ICD-10-CM

## 2013-10-04 DIAGNOSIS — M810 Age-related osteoporosis without current pathological fracture: Secondary | ICD-10-CM

## 2013-10-04 LAB — CBC WITH DIFFERENTIAL/PLATELET
BASO%: 0.7 % (ref 0.0–2.0)
BASOS ABS: 0 10*3/uL (ref 0.0–0.1)
EOS%: 2.4 % (ref 0.0–7.0)
Eosinophils Absolute: 0.1 10*3/uL (ref 0.0–0.5)
HEMATOCRIT: 38.6 % (ref 34.8–46.6)
HEMOGLOBIN: 12.6 g/dL (ref 11.6–15.9)
LYMPH#: 2 10*3/uL (ref 0.9–3.3)
LYMPH%: 39.7 % (ref 14.0–49.7)
MCH: 27.9 pg (ref 25.1–34.0)
MCHC: 32.7 g/dL (ref 31.5–36.0)
MCV: 85.4 fL (ref 79.5–101.0)
MONO#: 0.5 10*3/uL (ref 0.1–0.9)
MONO%: 10.1 % (ref 0.0–14.0)
NEUT#: 2.4 10*3/uL (ref 1.5–6.5)
NEUT%: 47.1 % (ref 38.4–76.8)
PLATELETS: 329 10*3/uL (ref 145–400)
RBC: 4.52 10*6/uL (ref 3.70–5.45)
RDW: 14.2 % (ref 11.2–14.5)
WBC: 5.1 10*3/uL (ref 3.9–10.3)

## 2013-10-04 LAB — COMPREHENSIVE METABOLIC PANEL (CC13)
ALT: 15 U/L (ref 0–55)
ANION GAP: 9 meq/L (ref 3–11)
AST: 18 U/L (ref 5–34)
Albumin: 3.4 g/dL — ABNORMAL LOW (ref 3.5–5.0)
Alkaline Phosphatase: 66 U/L (ref 40–150)
BUN: 10 mg/dL (ref 7.0–26.0)
CHLORIDE: 110 meq/L — AB (ref 98–109)
CO2: 23 meq/L (ref 22–29)
CREATININE: 0.8 mg/dL (ref 0.6–1.1)
Calcium: 9.7 mg/dL (ref 8.4–10.4)
Glucose: 111 mg/dl (ref 70–140)
Potassium: 3.9 mEq/L (ref 3.5–5.1)
Sodium: 142 mEq/L (ref 136–145)
Total Protein: 7.3 g/dL (ref 6.4–8.3)

## 2013-10-04 NOTE — Patient Instructions (Signed)
You are doing well.  You have no sign of recurrence.  Contact your PCP about other bisphosphanate therapy for your osteoporosis.  We will see you back in 6 months.    Please call us if you have any questions or concerns.

## 2013-10-04 NOTE — Progress Notes (Signed)
Hematology and Oncology Follow Up Visit  Brittany [name] [id number] 11-01-1928 80 y.o. 10/05/2013 8:56 AM     Principle Diagnosis:Brittany [name] 80 y.o. female with h/o stage IA ER positive, PR positive, invasive ductal carcinoma of the right breast.     Prior Therapy:  1. Brittany [name] had a screening mammogram in 06/2006 that saw an abnormality in the right breast. 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 08/01/06 an MRI showed a 1.5 x 1.3 x 0.7 cm mass in the lower outer quadrant of the right breast. She did not have surgery at that time due to her sick son and husband and she says also she was in denial that she in fact had cancer.   2. On 09/20/08 she had repeat diagnostic mammo that continued to show the right breast mass. She underwent a lumpectomy on 10/10/08 by Dr. Margot Chimes that demonstrated a 1.2 cm ER 100%, PR 86%, HER-2/neu negative invasive ductal carcinoma. No sentinel lymph nodes were sampled.   3. She then underwent radiation therapy under the care of Dr. Lisbeth Renshaw from 11/21/2008 to 01/14/2009.   4. She returned to see Dr. Truddie Coco in August 2010 and was started on daily Tamoxifen.    Current therapy:  Tamoxifen 53m daily.    Interim History: Brittany Pancoast89y.o. female with h/o stage IA ER/PR positive invasive ductal carcinoma of the right breast.  She is doing well today.  She has been taking the Tamoxifen daily and tolerated it very well.  She tells me that she was not started on Tamoxifen in August, but in April and that she is going to finish the pill bottle that she is taking and then she is going to stop.  She has hot flashes and joint aches from the therapy.  She is not taking Fosamax any longer due to the inability to tolerate it.  She has tried to get approved for Prolia, but [employer] denied it.  She is otherwise doing well.  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  She denies fevers, chills, unintentional weight loss, or any other  difficulties.  We reviewed her health maintenance below.    Medications:  Current Outpatient Prescriptions  Medication Sig Dispense Refill  . amLODipine (NORVASC) 10 MG tablet Take 1 tablet (10 mg total) by mouth daily.  90 tablet  3  . aspirin EC 81 MG EC tablet Take 81 mg by mouth daily.        . carvedilol (COREG) 6.25 MG tablet take 1 tablet by mouth twice a day with meals  180 tablet  3  . Cholecalciferol 1000 UNIT tablet Take 1,000 Units by mouth daily.        . furosemide (LASIX) 20 MG tablet TAKE ONE (1) TABLET EACH DAY  90 tablet  3  . Glucosamine 500 MG TABS Take by mouth.        . metFORMIN (GLUCOPHAGE) 500 MG tablet take 1 tablet by mouth once daily  90 tablet  3  . potassium chloride SA (K-DUR,KLOR-CON) 20 MEQ tablet TAKE ONE (1) TABLET THREE (3) TIMES EACH DAY  90 tablet  4  . tamoxifen (NOLVADEX) 20 MG tablet Take 1 tablet (20 mg total) by mouth daily.  30 tablet  5  . Pitavastatin Calcium (LIVALO) 4 MG TABS Take 1 tablet (4 mg total) by mouth daily.  700 tablet  0   No current facility-administered medications for this visit.  Allergies:  Allergies  Allergen Reactions  . Olmesartan     headache  . Alendronate Sodium     REACTION: heartburn  . Lisinopril     REACTION: dizziness    Medical History: Past Medical History  Diagnosis Date  . Hypertension   . Osteoporosis   . Hyperlipidemia   . Positive PPD     history off  . Diverticulosis of colon   . DJD (degenerative joint disease) of knee     Left  . Diabetes mellitus     type 2. 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 Cancer     right breast    Surgical History:  Past Surgical History  Procedure Laterality Date  . Breast surgery  2010    + RT     Review of Systems: A 10 point review of systems was conducted and is otherwise negative except for what is noted above.    HEALTH MAINTENANCE:  Mammogram 08/2013, normal  Colonoscopy 8 years ago  Bone Density 03/2013, osteoporosis,  improved from prior Pap Smear unknown  Eye Exam 12/28/12  Vitamin D pending Lipid profile: 05/2013   Physical Exam: Blood pressure 173/93, pulse 91, temperature 98.5 F (36.9 C), temperature source Oral, resp. rate 20, height 4' 11"  (1.499 m), weight 184 lb 1.6 oz (83.507 kg). GENERAL: Patient is a well appearing elderly female in no acute distress, appears younger than stated age 42:  Sclerae anicteric.  Oropharynx clear and moist. No ulcerations or evidence of oropharyngeal candidiasis. Neck is supple.  NODES:  No cervical, supraclavicular, or axillary lymphadenopathy palpated.  BREAST EXAM:  Left breast with no masses, nodules, skin changes, right breast lumpectomy site w/o nodularity, no nodules, masses, benign bilateral breast exam. LUNGS:  Clear to auscultation bilaterally.  No wheezes or rhonchi. HEART:  Regular rate and rhythm. No murmur appreciated. ABDOMEN:  Soft, nontender.  Positive, normoactive bowel sounds. No organomegaly palpated. MSK:  No focal spinal tenderness to palpation. Full range of motion bilaterally in the upper extremities. EXTREMITIES:  No peripheral edema.   SKIN:  Clear with no obvious rashes or skin changes. No nail dyscrasia. NEURO:  Nonfocal. Well oriented.  Appropriate affect. ECOG PERFORMANCE STATUS: 1 - Symptomatic but completely ambulatory   Lab Results: Lab Results  Component Value Date   WBC 5.1 10/04/2013   HGB 12.6 10/04/2013   HCT 38.6 10/04/2013   MCV 85.4 10/04/2013   PLT 329 10/04/2013     Chemistry      Component Value Date/Time   NA 142 10/04/2013 0836   NA 140 07/13/2013 0859   K 3.9 10/04/2013 0836   K 3.8 07/13/2013 0859   CL 106 07/13/2013 0859   CL 107 09/28/2012 1004   CO2 23 10/04/2013 0836   CO2 27 07/13/2013 0859   BUN 10.0 10/04/2013 0836   BUN 11 07/13/2013 0859   CREATININE 0.8 10/04/2013 0836   CREATININE 0.8 07/13/2013 0859      Component Value Date/Time   CALCIUM 9.7 10/04/2013 0836   CALCIUM 9.3 07/13/2013 0859   ALKPHOS 66 10/04/2013  0836   ALKPHOS 62 10/12/2012 0856   AST 18 10/04/2013 0836   AST 25 10/12/2012 0856   ALT 15 10/04/2013 0836   ALT 18 10/12/2012 0856   BILITOT <0.20 10/04/2013 0836   BILITOT 0.3 10/12/2012 0856       Radiological Studies: Clinical Data: 80 year old postmenopausal female taking Fosamax. 04/07/2013 DUAL X-RAY ABSORPTIOMETRY (DXA) FOR BONE MINERAL DENSITY  AP LUMBAR SPINE (L1, L3-L4)  Bone Mineral Density (BMD): 0.864 g/cm2  Young Adult T Score: -1.7  Z Score: 0.4  LEFT FEMUR NECK  Bone Mineral Density (BMD): 0.551 g/cm2  Young Adult T Score: -2.7  Z Score: -0.9  ASSESSMENT: Patient's diagnostic category is OSTEOPOROSIS by WHO  Criteria.  FRACTURE RISK: UNKNOWN IN THIS PATIENT TAKING FOSAMAX  FRAX: World Health Organization FRAX assessment of absolute  fracture risk is not calculated for this patient because the  patient has osteoporosis and a history of bone building therapy.  Comparison: The bone mineral density of the lumbar spine has  increased by 5.7% and the bone mineral density of the left hip has  decreased by 3.6% when compared to the 02/24/2011 study.  RECOMMENDATIONS:  All patients should ensure an adequate intake of dietary calcium  (1238m daily) and vitamin D (800 IU daily) unless contraindicated.  The National Osteoporosis Foundation recommends that FDA-approved  medical therapies be considered in postmenopausal women and mean  age 651or older with a:  1) Hip or vertebral (clinical or morphometric) fracture.  2) T-score of -2.5 or lower at the spine or hip.  3) Ten-year fracture probability by FRAX of 3% or greater for hip  fracture or 20% or greater for major osteoporotic fracture.  FOLLOW-UP:  People with diagnosed cases of osteoporosis or at high risk for  fracture should have regular bone mineral density tests. For  patients eligible for Medicare, routine testing is allowed once  every 2 years. The testing frequency can be increased to one year  for patients who  have rapidly progressing disease, those who are  receiving or discontinuing medical therapy to restore bone mass, or  have additional risk factors.  Original Report Authenticated By: DLillia Mountain M.D.   Assessment and Plan: Brittany Pancoast89y.o. female with  1. Stage IA ER/PR positive invasive ductal carcinoma of the right breast.  She has underwent a lumpectomy follwed by radiation.  She was started on Tamoxifen in August 2010 per our records.  However she is planning on stopping the Tamoxifen daily after her next bottle a few months early.  She is doing well.  Her health maintenance is up to date.  We discussed survivorship including healthy diet, exercise, and self breast exams.   2. Osteoporosis.  She was unable to tolerate fosamax, but did take it for almost 5 years.  Her bone density is improved from 04/07/13 as compared to the bone density from 01/2011.  She will follow up with her PCP about other bisphosphanate therapy.  3. Hypertension.  She tells me that her blood pressure always runs high and that it recently was higher and is better today.  We will send her VS today to her PCP.    The patient will return in 6 months for labs and evaluation.  She knows to contact [facility] interim for any questions or concerns.  We can certainly see her sooner if needed.   I spent 25 minutes counseling the patient face to face.  The total time spent in the appointment was 30 minutes.  LMinette Headland [phone]/02/2014 8:56 AM

## 2013-10-05 LAB — VITAMIN D 25 HYDROXY (VIT D DEFICIENCY, FRACTURES): Vit D, 25-Hydroxy: 61 ng/mL (ref 30–89)

## 2013-11-24 ENCOUNTER — Other Ambulatory Visit: Payer: Self-pay | Admitting: Internal Medicine

## 2013-12-18 ENCOUNTER — Other Ambulatory Visit: Payer: Self-pay | Admitting: Internal Medicine

## 2013-12-19 ENCOUNTER — Ambulatory Visit: Payer: Medicare Other | Admitting: Internal Medicine

## 2013-12-25 ENCOUNTER — Encounter: Payer: Self-pay | Admitting: Internal Medicine

## 2013-12-25 ENCOUNTER — Ambulatory Visit (INDEPENDENT_AMBULATORY_CARE_PROVIDER_SITE_OTHER): Payer: Medicare Other | Admitting: Internal Medicine

## 2013-12-25 ENCOUNTER — Other Ambulatory Visit (INDEPENDENT_AMBULATORY_CARE_PROVIDER_SITE_OTHER): Payer: Medicare Other

## 2013-12-25 VITALS — BP 140/80 | HR 85 | Temp 98.2°F | Resp 16 | Ht 59.0 in | Wt 186.0 lb

## 2013-12-25 DIAGNOSIS — E785 Hyperlipidemia, unspecified: Secondary | ICD-10-CM

## 2013-12-25 DIAGNOSIS — IMO0002 Reserved for concepts with insufficient information to code with codable children: Secondary | ICD-10-CM

## 2013-12-25 DIAGNOSIS — IMO0001 Reserved for inherently not codable concepts without codable children: Secondary | ICD-10-CM

## 2013-12-25 DIAGNOSIS — Z0000 Encounter for general adult medical examination without abnormal findings: Secondary | ICD-10-CM

## 2013-12-25 DIAGNOSIS — I10 Essential (primary) hypertension: Secondary | ICD-10-CM

## 2013-12-25 DIAGNOSIS — E1165 Type 2 diabetes mellitus with hyperglycemia: Principal | ICD-10-CM

## 2013-12-25 DIAGNOSIS — Z23 Encounter for immunization: Secondary | ICD-10-CM

## 2013-12-25 DIAGNOSIS — R7611 Nonspecific reaction to tuberculin skin test without active tuberculosis: Secondary | ICD-10-CM

## 2013-12-25 DIAGNOSIS — M1710 Unilateral primary osteoarthritis, unspecified knee: Secondary | ICD-10-CM

## 2013-12-25 LAB — CBC WITH DIFFERENTIAL/PLATELET
BASOS PCT: 0.5 % (ref 0.0–3.0)
Basophils Absolute: 0 10*3/uL (ref 0.0–0.1)
EOS PCT: 3.2 % (ref 0.0–5.0)
Eosinophils Absolute: 0.2 10*3/uL (ref 0.0–0.7)
HEMATOCRIT: 38 % (ref 36.0–46.0)
HEMOGLOBIN: 12.6 g/dL (ref 12.0–15.0)
LYMPHS ABS: 2 10*3/uL (ref 0.7–4.0)
Lymphocytes Relative: 41.1 % (ref 12.0–46.0)
MCHC: 33.1 g/dL (ref 30.0–36.0)
MCV: 86.2 fl (ref 78.0–100.0)
MONO ABS: 0.4 10*3/uL (ref 0.1–1.0)
Monocytes Relative: 8.4 % (ref 3.0–12.0)
Neutro Abs: 2.3 10*3/uL (ref 1.4–7.7)
Neutrophils Relative %: 46.8 % (ref 43.0–77.0)
Platelets: 361 10*3/uL (ref 150.0–400.0)
RBC: 4.41 Mil/uL (ref 3.87–5.11)
RDW: 14.6 % (ref 11.5–15.5)
WBC: 4.9 10*3/uL (ref 4.0–10.5)

## 2013-12-25 LAB — BASIC METABOLIC PANEL
BUN: 12 mg/dL (ref 6–23)
CALCIUM: 9.4 mg/dL (ref 8.4–10.5)
CO2: 27 mEq/L (ref 19–32)
Chloride: 107 mEq/L (ref 96–112)
Creatinine, Ser: 0.8 mg/dL (ref 0.4–1.2)
GFR: 93.43 mL/min (ref >60.00)
GLUCOSE: 101 mg/dL — AB (ref 70–99)
POTASSIUM: 3.8 meq/L (ref 3.5–5.1)
Sodium: 140 mEq/L (ref 135–145)

## 2013-12-25 LAB — HEMOGLOBIN A1C: Hgb A1c MFr Bld: 6.3 % (ref 4.6–6.5)

## 2013-12-25 LAB — LIPID PANEL
CHOLESTEROL: 192 mg/dL (ref 0–200)
HDL: 66.5 mg/dL (ref >39.00)
LDL Cholesterol: 109 mg/dL — ABNORMAL HIGH (ref 0–99)
NonHDL: 125.5
Total CHOL/HDL Ratio: 3
Triglycerides: 84 mg/dL (ref 0.0–149.0)
VLDL: 16.8 mg/dL (ref 0.0–40.0)

## 2013-12-25 MED ORDER — FUROSEMIDE 20 MG PO TABS: ORAL_TABLET | ORAL | Status: DC

## 2013-12-25 NOTE — Progress Notes (Signed)
Subjective:    Patient ID: Brittany [name], female    DOB: 1929-01-03, 80 y.o.   MRN: [id number]  Hypertension This is a chronic problem. The current episode started more than 1 year ago. The problem is unchanged. The problem is controlled. Pertinent negatives include no anxiety, blurred vision, chest pain, headaches, malaise/fatigue, neck pain, orthopnea, palpitations, peripheral edema, PND, shortness of breath or sweats. There are no associated agents to hypertension. Past treatments include beta blockers, calcium channel blockers and diuretics. The current treatment provides moderate improvement. Compliance problems include exercise and diet.       Review of Systems  Constitutional: Negative.  Negative for fever, chills, malaise/fatigue, diaphoresis, appetite change and fatigue.  HENT: Negative.   Eyes: Negative.  Negative for blurred vision.  Respiratory: Negative.  Negative for cough, choking, chest tightness, shortness of breath and stridor.   Cardiovascular: Negative.  Negative for chest pain, palpitations, orthopnea, leg swelling and PND.  Gastrointestinal: Negative.  Negative for nausea, vomiting, abdominal pain, diarrhea, constipation and blood in stool.  Endocrine: Negative.  Negative for polydipsia, polyphagia and polyuria.  Genitourinary: Negative.   Musculoskeletal: Negative.  Negative for arthralgias, back pain, gait problem, joint swelling, myalgias, neck pain and neck stiffness.  Skin: Negative.   Allergic/Immunologic: Negative.   Neurological: Negative.  Negative for dizziness, syncope, speech difficulty, weakness, light-headedness, numbness and headaches.  Hematological: Negative.  Negative for adenopathy. Does not bruise/bleed easily.  Psychiatric/Behavioral: Negative.        Objective:   Physical Exam  Vitals reviewed. Constitutional: She is oriented to person, place, and time. She appears well-developed and well-nourished. No distress.  HENT:  Head:  Normocephalic and atraumatic.  Mouth/Throat: Oropharynx is clear and moist. No oropharyngeal exudate.  Eyes: Conjunctivae are normal. Right eye exhibits no discharge. Left eye exhibits no discharge. No scleral icterus.  Neck: Normal range of motion. Neck supple. No JVD present. No tracheal deviation present. No thyromegaly present.  Cardiovascular: Normal rate, regular rhythm, normal heart sounds and intact distal pulses.  Exam reveals no gallop and no friction rub.   No murmur heard. Pulmonary/Chest: Effort normal and breath sounds normal. No stridor. No respiratory distress. She has no wheezes. She has no rales. She exhibits no tenderness.  Abdominal: Soft. Bowel sounds are normal. She exhibits no distension and no mass. There is no tenderness. There is no rebound and no guarding.  Musculoskeletal: Normal range of motion. She exhibits no edema and no tenderness.  Lymphadenopathy:    She has no cervical adenopathy.  Neurological: She is oriented to person, place, and time.  Skin: Skin is warm and dry. No rash noted. She is not diaphoretic. No erythema. No pallor.  Psychiatric: She has a normal mood and affect. Her behavior is normal. Judgment and thought content normal.     Lab Results  Component Value Date   WBC 5.1 10/04/2013   HGB 12.6 10/04/2013   HCT 38.6 10/04/2013   PLT 329 10/04/2013   GLUCOSE 111 10/04/2013   CHOL 171 02/09/2013   TRIG 103.0 02/09/2013   HDL 62.30 02/09/2013   LDLDIRECT 119.8 04/02/2008   LDLCALC 88 02/09/2013   ALT 15 10/04/2013   AST 18 10/04/2013   NA 142 10/04/2013   K 3.9 10/04/2013   CL 106 07/13/2013   CREATININE 0.8 10/04/2013   BUN 10.0 10/04/2013   CO2 23 10/04/2013   TSH 3.24 10/12/2012   HGBA1C 6.4 07/13/2013   MICROALBUR 0.3 04/18/2009  Assessment & Plan:

## 2013-12-25 NOTE — Assessment & Plan Note (Signed)
Her blood sugars are well controlled 

## 2013-12-25 NOTE — Patient Instructions (Signed)
Preventive Care for Adults A healthy lifestyle and preventive care can promote health and wellness. Preventive health guidelines for women include the following key practices.  A routine yearly physical is a good way to check with your health care provider about your health and preventive screening. It is a chance to share any concerns and updates on your health and to receive a thorough exam.  Visit your dentist for a routine exam and preventive care every 6 months. Brush your teeth twice a day and floss once a day. Good oral hygiene prevents tooth decay and gum disease.  The frequency of eye exams is based on your age, health, family medical history, use of contact lenses, and other factors. Follow your health care provider's recommendations for frequency of eye exams.  Eat a healthy diet. Foods like vegetables, fruits, whole grains, low-fat dairy products, and lean protein foods contain the nutrients you need without too many calories. Decrease your intake of foods high in solid fats, added sugars, and salt. Eat the right amount of calories for you.Get information about a proper diet from your health care provider, if necessary.  Regular physical exercise is one of the most important things you can do for your health. Most adults should get at least 150 minutes of moderate-intensity exercise (any activity that increases your heart rate and causes you to sweat) each week. In addition, most adults need muscle-strengthening exercises on 2 or more days a week.  Maintain a healthy weight. The body mass index (BMI) is a screening tool to identify possible weight problems. It provides an estimate of body fat based on height and weight. Your health care provider can find your BMI, and can help you achieve or maintain a healthy weight.For adults 20 years and older:  A BMI below 18.5 is considered underweight.  A BMI of 18.5 to 24.9 is normal.  A BMI of 25 to 29.9 is considered overweight.  A BMI of  30 and above is considered obese.  Maintain normal blood lipids and cholesterol levels by exercising and minimizing your intake of saturated fat. Eat a balanced diet with plenty of fruit and vegetables. Blood tests for lipids and cholesterol should begin at age 52 and be repeated every 5 years. If your lipid or cholesterol levels are high, you are over 50, or you are at high risk for heart disease, you may need your cholesterol levels checked more frequently.Ongoing high lipid and cholesterol levels should be treated with medicines if diet and exercise are not working.  If you smoke, find out from your health care provider how to quit. If you do not use tobacco, do not start.  Lung cancer screening is recommended for adults aged 37-80 years who are at high risk for developing lung cancer because of a history of smoking. A yearly low-dose CT scan of the lungs is recommended for people who have at least a 30-pack-year history of smoking and are a current smoker or have quit within the past 15 years. A pack year of smoking is smoking an average of 1 pack of cigarettes a day for 1 year (for example: 1 pack a day for 30 years or 2 packs a day for 15 years). Yearly screening should continue until the smoker has stopped smoking for at least 15 years. Yearly screening should be stopped for people who develop a health problem that would prevent them from having lung cancer treatment.  If you are pregnant, do not drink alcohol. If you are breastfeeding,  be very cautious about drinking alcohol. If you are not pregnant and choose to drink alcohol, do not have more than 1 drink per day. One drink is considered to be 12 ounces (355 mL) of beer, 5 ounces (148 mL) of wine, or 1.5 ounces (44 mL) of liquor.  Avoid use of street drugs. Do not share needles with anyone. Ask for help if you need support or instructions about stopping the use of drugs.  High blood pressure causes heart disease and increases the risk of  stroke. Your blood pressure should be checked at least every 1 to 2 years. Ongoing high blood pressure should be treated with medicines if weight loss and exercise do not work.  If you are 75-52 years old, ask your health care provider if you should take aspirin to prevent strokes.  Diabetes screening involves taking a blood sample to check your fasting blood sugar level. This should be done once every 3 years, after age 15, if you are within normal weight and without risk factors for diabetes. Testing should be considered at a younger age or be carried out more frequently if you are overweight and have at least 1 risk factor for diabetes.  Breast cancer screening is essential preventive care for women. You should practice "breast self-awareness." This means understanding the normal appearance and feel of your breasts and may include breast self-examination. Any changes detected, no matter how small, should be reported to a health care provider. Women in their 58s and 30s should have a clinical breast exam (CBE) by a health care provider as part of a regular health exam every 1 to 3 years. After age 16, women should have a CBE every year. Starting at age 53, women should consider having a mammogram (breast X-ray test) every year. Women who have a family history of breast cancer should talk to their health care provider about genetic screening. Women at a high risk of breast cancer should talk to their health care providers about having an MRI and a mammogram every year.  Breast cancer gene (BRCA)-related cancer risk assessment is recommended for women who have family members with BRCA-related cancers. BRCA-related cancers include breast, ovarian, tubal, and peritoneal cancers. Having family members with these cancers may be associated with an increased risk for harmful changes (mutations) in the breast cancer genes BRCA1 and BRCA2. Results of the assessment will determine the need for genetic counseling and  BRCA1 and BRCA2 testing.  Routine pelvic exams to screen for cancer are no longer recommended for nonpregnant women who are considered low risk for cancer of the pelvic organs (ovaries, uterus, and vagina) and who do not have symptoms. Ask your health care provider if a screening pelvic exam is right for you.  If you have had past treatment for cervical cancer or a condition that could lead to cancer, you need Pap tests and screening for cancer for at least 20 years after your treatment. If Pap tests have been discontinued, your risk factors (such as having a new sexual partner) need to be reassessed to determine if screening should be resumed. Some women have medical problems that increase the chance of getting cervical cancer. In these cases, your health care provider may recommend more frequent screening and Pap tests.  The HPV test is an additional test that may be used for cervical cancer screening. The HPV test looks for the virus that can cause the cell changes on the cervix. The cells collected during the Pap test can be  tested for HPV. The HPV test could be used to screen women aged 47 years and older, and should be used in women of any age who have unclear Pap test results. After the age of 36, women should have HPV testing at the same frequency as a Pap test.  Colorectal cancer can be detected and often prevented. Most routine colorectal cancer screening begins at the age of 38 years and continues through age 58 years. However, your health care provider may recommend screening at an earlier age if you have risk factors for colon cancer. On a yearly basis, your health care provider may provide home test kits to check for hidden blood in the stool. Use of a small camera at the end of a tube, to directly examine the colon (sigmoidoscopy or colonoscopy), can detect the earliest forms of colorectal cancer. Talk to your health care provider about this at age 64, when routine screening begins. Direct  exam of the colon should be repeated every 5-10 years through age 21 years, unless early forms of pre-cancerous polyps or small growths are found.  People who are at an increased risk for hepatitis B should be screened for this virus. You are considered at high risk for hepatitis B if:  You were born in a country where hepatitis B occurs often. Talk with your health care provider about which countries are considered high risk.  Your parents were born in a high-risk country and you have not received a shot to protect against hepatitis B (hepatitis B vaccine).  You have HIV or AIDS.  You use needles to inject street drugs.  You live with, or have sex with, someone who has Hepatitis B.  You get hemodialysis treatment.  You take certain medicines for conditions like cancer, organ transplantation, and autoimmune conditions.  Hepatitis C blood testing is recommended for all people born from [birth year] through 1965 and any individual with known risks for hepatitis C.  Practice safe sex. Use condoms and avoid high-risk sexual practices to reduce the spread of sexually transmitted infections (STIs). STIs include gonorrhea, chlamydia, syphilis, trichomonas, herpes, HPV, and human immunodeficiency virus (HIV). Herpes, HIV, and HPV are viral illnesses that have no cure. They can result in disability, cancer, and death.  You should be screened for sexually transmitted illnesses (STIs) including gonorrhea and chlamydia if:  You are sexually active and are younger than 24 years.  You are older than 24 years and your health care provider tells you that you are at risk for this type of infection.  Your sexual activity has changed since you were last screened and you are at an increased risk for chlamydia or gonorrhea. Ask your health care provider if you are at risk.  If you are at risk of being infected with HIV, it is recommended that you take a prescription medicine daily to prevent HIV infection. This is  called preexposure prophylaxis (PrEP). You are considered at risk if:  You are a heterosexual woman, are sexually active, and are at increased risk for HIV infection.  You take drugs by injection.  You are sexually active with a partner who has HIV.  Talk with your health care provider about whether you are at high risk of being infected with HIV. If you choose to begin PrEP, you should first be tested for HIV. You should then be tested every 3 months for as long as you are taking PrEP.  Osteoporosis is a disease in which the bones lose minerals and strength  with aging. This can result in serious bone fractures or breaks. The risk of osteoporosis can be identified using a bone density scan. Women ages 65 years and over and women at risk for fractures or osteoporosis should discuss screening with their health care providers. Ask your health care provider whether you should take a calcium supplement or vitamin D to reduce the rate of osteoporosis.  Menopause can be associated with physical symptoms and risks. Hormone replacement therapy is available to decrease symptoms and risks. You should talk to your health care provider about whether hormone replacement therapy is right for you.  Use sunscreen. Apply sunscreen liberally and repeatedly throughout the day. You should seek shade when your shadow is shorter than you. Protect yourself by wearing long sleeves, pants, a wide-brimmed hat, and sunglasses year round, whenever you are outdoors.  Once a month, do a whole body skin exam, using a mirror to look at the skin on your back. Tell your health care provider of new moles, moles that have irregular borders, moles that are larger than a pencil eraser, or moles that have changed in shape or color.  Stay current with required vaccines (immunizations).  Influenza vaccine. All adults should be immunized every year.  Tetanus, diphtheria, and acellular pertussis (Td, Tdap) vaccine. Pregnant women should  receive 1 dose of Tdap vaccine during each pregnancy. The dose should be obtained regardless of the length of time since the last dose. Immunization is preferred during the 27th-36th week of gestation. An adult who has not previously received Tdap or who does not know her vaccine status should receive 1 dose of Tdap. This initial dose should be followed by tetanus and diphtheria toxoids (Td) booster doses every 10 years. Adults with an unknown or incomplete history of completing a 3-dose immunization series with Td-containing vaccines should begin or complete a primary immunization series including a Tdap dose. Adults should receive a Td booster every 10 years.  Varicella vaccine. An adult without evidence of immunity to varicella should receive 2 doses or a second dose if she has previously received 1 dose. Pregnant females who do not have evidence of immunity should receive the first dose after pregnancy. This first dose should be obtained before leaving the health care facility. The second dose should be obtained 4-8 weeks after the first dose.  Human papillomavirus (HPV) vaccine. Females aged 13-26 years who have not received the vaccine previously should obtain the 3-dose series. The vaccine is not recommended for use in pregnant females. However, pregnancy testing is not needed before receiving a dose. If a female is found to be pregnant after receiving a dose, no treatment is needed. In that case, the remaining doses should be delayed until after the pregnancy. Immunization is recommended for any person with an immunocompromised condition through the age of 26 years if she did not get any or all doses earlier. During the 3-dose series, the second dose should be obtained 4-8 weeks after the first dose. The third dose should be obtained 24 weeks after the first dose and 16 weeks after the second dose.  Zoster vaccine. One dose is recommended for adults aged 60 years or older unless certain conditions are  present.  Measles, mumps, and rubella (MMR) vaccine. Adults born before 1957 generally are considered immune to measles and mumps. Adults born in 1957 or later should have 1 or more doses of MMR vaccine unless there is a contraindication to the vaccine or there is laboratory evidence of immunity to   each of the three diseases. A routine second dose of MMR vaccine should be obtained at least 28 days after the first dose for students attending postsecondary schools, health care workers, or international travelers. People who received inactivated measles vaccine or an unknown type of measles vaccine during 1963-1967 should receive 2 doses of MMR vaccine. People who received inactivated mumps vaccine or an unknown type of mumps vaccine before 1979 and are at high risk for mumps infection should consider immunization with 2 doses of MMR vaccine. For females of childbearing age, rubella immunity should be determined. If there is no evidence of immunity, females who are not pregnant should be vaccinated. If there is no evidence of immunity, females who are pregnant should delay immunization until after pregnancy. Unvaccinated health care workers born before 1957 who lack laboratory evidence of measles, mumps, or rubella immunity or laboratory confirmation of disease should consider measles and mumps immunization with 2 doses of MMR vaccine or rubella immunization with 1 dose of MMR vaccine.  Pneumococcal 13-valent conjugate (PCV13) vaccine. When indicated, a person who is uncertain of her immunization history and has no record of immunization should receive the PCV13 vaccine. An adult aged 19 years or older who has certain medical conditions and has not been previously immunized should receive 1 dose of PCV13 vaccine. This PCV13 should be followed with a dose of pneumococcal polysaccharide (PPSV23) vaccine. The PPSV23 vaccine dose should be obtained at least 8 weeks after the dose of PCV13 vaccine. An adult aged 19  years or older who has certain medical conditions and previously received 1 or more doses of PPSV23 vaccine should receive 1 dose of PCV13. The PCV13 vaccine dose should be obtained 1 or more years after the last PPSV23 vaccine dose.  Pneumococcal polysaccharide (PPSV23) vaccine. When PCV13 is also indicated, PCV13 should be obtained first. All adults aged 65 years and older should be immunized. An adult younger than age 65 years who has certain medical conditions should be immunized. Any person who resides in a nursing home or long-term care facility should be immunized. An adult smoker should be immunized. People with an immunocompromised condition and certain other conditions should receive both PCV13 and PPSV23 vaccines. People with human immunodeficiency virus (HIV) infection should be immunized as soon as possible after diagnosis. Immunization during chemotherapy or radiation therapy should be avoided. Routine use of PPSV23 vaccine is not recommended for American Indians, [place] Natives, or people younger than 65 years unless there are medical conditions that require PPSV23 vaccine. When indicated, people who have unknown immunization and have no record of immunization should receive PPSV23 vaccine. One-time revaccination 5 years after the first dose of PPSV23 is recommended for people aged 19-64 years who have chronic kidney failure, nephrotic syndrome, asplenia, or immunocompromised conditions. People who received 1-2 doses of PPSV23 before age 65 years should receive another dose of PPSV23 vaccine at age 65 years or later if at least 5 years have passed since the previous dose. Doses of PPSV23 are not needed for people immunized with PPSV23 at or after age 65 years.  Meningococcal vaccine. Adults with asplenia or persistent complement component deficiencies should receive 2 doses of quadrivalent meningococcal conjugate (MenACWY-D) vaccine. The doses should be obtained at least 2 months apart.  Microbiologists working with certain meningococcal bacteria, military recruits, people at risk during an outbreak, and people who travel to or live in countries with a high rate of meningitis should be immunized. A first-year college student up through age   21 years who is living in a residence hall should receive a dose if she did not receive a dose on or after her 16th birthday. Adults who have certain high-risk conditions should receive one or more doses of vaccine.  Hepatitis A vaccine. Adults who wish to be protected from this disease, have certain high-risk conditions, work with hepatitis A-infected animals, work in hepatitis A research labs, or travel to or work in countries with a high rate of hepatitis A should be immunized. Adults who were previously unvaccinated and who anticipate close contact with an international adoptee during the first 60 days after arrival in the [place] from a country with a high rate of hepatitis A should be immunized.  Hepatitis B vaccine. Adults who wish to be protected from this disease, have certain high-risk conditions, may be exposed to blood or other infectious body fluids, are household contacts or sex partners of hepatitis B positive people, are clients or workers in certain care facilities, or travel to or work in countries with a high rate of hepatitis B should be immunized.  Haemophilus influenzae type b (Hib) vaccine. A previously unvaccinated person with asplenia or sickle cell disease or having a scheduled splenectomy should receive 1 dose of Hib vaccine. Regardless of previous immunization, a recipient of a hematopoietic stem cell transplant should receive a 3-dose series 6-12 months after her successful transplant. Hib vaccine is not recommended for adults with HIV infection. Preventive Services / Frequency Ages 43 to 39years  Blood pressure check.** / Every 1 to 2 years.  Lipid and cholesterol check.** / Every 5 years beginning at age  75.  Clinical breast exam.** / Every 3 years for women in their 32s and 74s.  BRCA-related cancer risk assessment.** / For women who have family members with a BRCA-related cancer (breast, ovarian, tubal, or peritoneal cancers).  Pap test.** / Every 2 years from ages 65 through 91. Every 3 years starting at age 34 through age 93 or 72 with a history of 3 consecutive normal Pap tests.  HPV screening.** / Every 3 years from ages 46 through ages 53 to 26 with a history of 3 consecutive normal Pap tests.  Hepatitis C blood test.** / For any individual with known risks for hepatitis C.  Skin self-exam. / Monthly.  Influenza vaccine. / Every year.  Tetanus, diphtheria, and acellular pertussis (Tdap, Td) vaccine.** / Consult your health care provider. Pregnant women should receive 1 dose of Tdap vaccine during each pregnancy. 1 dose of Td every 10 years.  Varicella vaccine.** / Consult your health care provider. Pregnant females who do not have evidence of immunity should receive the first dose after pregnancy.  HPV vaccine. / 3 doses over 6 months, if 70 and younger. The vaccine is not recommended for use in pregnant females. However, pregnancy testing is not needed before receiving a dose.  Measles, mumps, rubella (MMR) vaccine.** / You need at least 1 dose of MMR if you were born in 1957 or later. You may also need a 2nd dose. For females of childbearing age, rubella immunity should be determined. If there is no evidence of immunity, females who are not pregnant should be vaccinated. If there is no evidence of immunity, females who are pregnant should delay immunization until after pregnancy.  Pneumococcal 13-valent conjugate (PCV13) vaccine.** / Consult your health care provider.  Pneumococcal polysaccharide (PPSV23) vaccine.** / 1 to 2 doses if you smoke cigarettes or if you have certain conditions.  Meningococcal vaccine.** /  1 dose if you are age 70 to 51 years and a [place] living in a residence hall, or have one of several medical conditions, you need to get vaccinated against meningococcal disease. You may also need additional booster doses.  Hepatitis A vaccine.** / Consult your health care provider.  Hepatitis B vaccine.** / Consult your health care provider.  Haemophilus influenzae type b (Hib) vaccine.** / Consult your health care provider. Ages 40 to 64years  Blood pressure check.** / Every 1 to 2 years.  Lipid and cholesterol check.** / Every 5 years beginning at age 58 years.  Lung cancer screening. / Every year if you are aged 56-80 years and have a 30-pack-year history of smoking and currently smoke or have quit within the past 15 years. Yearly screening is stopped once you have quit smoking for at least 15 years or develop a health problem that would prevent you from having lung cancer treatment.  Clinical breast exam.** / Every year after age 35 years.  BRCA-related cancer risk assessment.** / For women who have family members with a BRCA-related cancer (breast, ovarian, tubal, or peritoneal cancers).  Mammogram.** / Every year beginning at age 109 years and continuing for as long as you are in good health. Consult with your health care provider.  Pap test.** / Every 3 years starting at age 44 years through age 94 or 70 years with a history of 3 consecutive normal Pap tests.  HPV screening.** / Every 3 years from ages 109 years through ages 50 to 30 years with a history of 3 consecutive normal Pap tests.  Fecal occult blood test (FOBT) of stool. / Every year beginning at age 73 years and continuing until age 59 years. You may not need to do this test if you get a colonoscopy every 10 years.  Flexible sigmoidoscopy or colonoscopy.** / Every 5 years for a flexible sigmoidoscopy or every 10 years for a colonoscopy beginning at age 68 years and continuing until age 12 years.  Hepatitis C blood test.** / For all people born from [birth year] through  1965 and any individual with known risks for hepatitis C.  Skin self-exam. / Monthly.  Influenza vaccine. / Every year.  Tetanus, diphtheria, and acellular pertussis (Tdap/Td) vaccine.** / Consult your health care provider. Pregnant women should receive 1 dose of Tdap vaccine during each pregnancy. 1 dose of Td every 10 years.  Varicella vaccine.** / Consult your health care provider. Pregnant females who do not have evidence of immunity should receive the first dose after pregnancy.  Zoster vaccine.** / 1 dose for adults aged 2 years or older.  Measles, mumps, rubella (MMR) vaccine.** / You need at least 1 dose of MMR if you were born in 1957 or later. You may also need a 2nd dose. For females of childbearing age, rubella immunity should be determined. If there is no evidence of immunity, females who are not pregnant should be vaccinated. If there is no evidence of immunity, females who are pregnant should delay immunization until after pregnancy.  Pneumococcal 13-valent conjugate (PCV13) vaccine.** / Consult your health care provider.  Pneumococcal polysaccharide (PPSV23) vaccine.** / 1 to 2 doses if you smoke cigarettes or if you have certain conditions.  Meningococcal vaccine.** / Consult your health care provider.  Hepatitis A vaccine.** / Consult your health care provider.  Hepatitis B vaccine.** / Consult your health care provider.  Haemophilus influenzae type b (Hib) vaccine.** / Consult your health care provider. Ages 48 years  and over  Blood pressure check.** / Every 1 to 2 years.  Lipid and cholesterol check.** / Every 5 years beginning at age 20 years.  Lung cancer screening. / Every year if you are aged 55-80 years and have a 30-pack-year history of smoking and currently smoke or have quit within the past 15 years. Yearly screening is stopped once you have quit smoking for at least 15 years or develop a health problem that would prevent you from having lung cancer  treatment.  Clinical breast exam.** / Every year after age 40 years.  BRCA-related cancer risk assessment.** / For women who have family members with a BRCA-related cancer (breast, ovarian, tubal, or peritoneal cancers).  Mammogram.** / Every year beginning at age 40 years and continuing for as long as you are in good health. Consult with your health care provider.  Pap test.** / Every 3 years starting at age 30 years through age 65 or 70 years with 3 consecutive normal Pap tests. Testing can be stopped between 65 and 70 years with 3 consecutive normal Pap tests and no abnormal Pap or HPV tests in the past 10 years.  HPV screening.** / Every 3 years from ages 30 years through ages 65 or 70 years with a history of 3 consecutive normal Pap tests. Testing can be stopped between 65 and 70 years with 3 consecutive normal Pap tests and no abnormal Pap or HPV tests in the past 10 years.  Fecal occult blood test (FOBT) of stool. / Every year beginning at age 50 years and continuing until age 75 years. You may not need to do this test if you get a colonoscopy every 10 years.  Flexible sigmoidoscopy or colonoscopy.** / Every 5 years for a flexible sigmoidoscopy or every 10 years for a colonoscopy beginning at age 50 years and continuing until age 75 years.  Hepatitis C blood test.** / For all people born from 1945 through 1965 and any individual with known risks for hepatitis C.  Osteoporosis screening.** / A one-time screening for women ages 65 years and over and women at risk for fractures or osteoporosis.  Skin self-exam. / Monthly.  Influenza vaccine. / Every year.  Tetanus, diphtheria, and acellular pertussis (Tdap/Td) vaccine.** / 1 dose of Td every 10 years.  Varicella vaccine.** / Consult your health care provider.  Zoster vaccine.** / 1 dose for adults aged 60 years or older.  Pneumococcal 13-valent conjugate (PCV13) vaccine.** / Consult your health care provider.  Pneumococcal  polysaccharide (PPSV23) vaccine.** / 1 dose for all adults aged 65 years and older.  Meningococcal vaccine.** / Consult your health care provider.  Hepatitis A vaccine.** / Consult your health care provider.  Hepatitis B vaccine.** / Consult your health care provider.  Haemophilus influenzae type b (Hib) vaccine.** / Consult your health care provider. ** Family history and personal history of risk and conditions may change your health care provider's recommendations. Document Released: 08/11/2001 Document Revised: 06/20/2013 Document Reviewed: 11/10/2010 [employer] Patient Information 2015 [employer]. This information is not intended to replace advice given to you by your health care provider. Make sure you discuss any questions you have with your health care provider. Type 2 Diabetes Mellitus, Adult Type 2 diabetes mellitus, often simply referred to as type 2 diabetes, is a long-lasting (chronic) disease. In type 2 diabetes, the pancreas does not make enough insulin (a hormone), the cells are less responsive to the insulin that is made (insulin resistance), or both. Normally, insulin moves sugars from   food into the tissue cells. The tissue cells use the sugars for energy. The lack of insulin or the lack of normal response to insulin causes excess sugars to build up in the blood instead of going into the tissue cells. As a result, high blood sugar (hyperglycemia) develops. The effect of high sugar (glucose) levels can cause many complications. Type 2 diabetes was also previously called adult-onset diabetes but it can occur at any age.  RISK FACTORS  A person is predisposed to developing type 2 diabetes if someone in the family has the disease and also has one or more of the following primary risk factors:  Overweight.  An inactive lifestyle.  A history of consistently eating high-calorie foods. Maintaining a normal weight and regular physical activity can reduce the chance of developing  type 2 diabetes. SYMPTOMS  A person with type 2 diabetes may not show symptoms initially. The symptoms of type 2 diabetes appear slowly. The symptoms include:  Increased thirst (polydipsia).  Increased urination (polyuria).  Increased urination during the night (nocturia).  Weight loss. This weight loss may be rapid.  Frequent, recurring infections.  Tiredness (fatigue).  Weakness.  Vision changes, such as blurred vision.  Fruity smell to your breath.  Abdominal pain.  Nausea or vomiting.  Cuts or bruises which are slow to heal.  Tingling or numbness in the hands or feet. DIAGNOSIS Type 2 diabetes is frequently not diagnosed until complications of diabetes are present. Type 2 diabetes is diagnosed when symptoms or complications are present and when blood glucose levels are increased. Your blood glucose level may be checked by one or more of the following blood tests:  A fasting blood glucose test. You will not be allowed to eat for at least 8 hours before a blood sample is taken.  A random blood glucose test. Your blood glucose is checked at any time of the day regardless of when you ate.  A hemoglobin A1c blood glucose test. A hemoglobin A1c test provides information about blood glucose control over the previous 3 months.  An oral glucose tolerance test (OGTT). Your blood glucose is measured after you have not eaten (fasted) for 2 hours and then after you drink a glucose-containing beverage. TREATMENT   You may need to take insulin or diabetes medicine daily to keep blood glucose levels in the desired range.  If you use insulin, you may need to adjust the dosage depending on the carbohydrates that you eat with each meal or snack. The treatment goal is to maintain the before meal blood sugar (preprandial glucose) level at 70-130 mg/dL. HOME CARE INSTRUCTIONS   Have your hemoglobin A1c level checked twice a year.  Perform daily blood glucose monitoring as directed by  your health care provider.  Monitor urine ketones when you are ill and as directed by your health care provider.  Take your diabetes medicine or insulin as directed by your health care provider to maintain your blood glucose levels in the desired range.  Never run out of diabetes medicine or insulin. It is needed every day.  If you are using insulin, you may need to adjust the amount of insulin given based on your intake of carbohydrates. Carbohydrates can raise blood glucose levels but need to be included in your diet. Carbohydrates provide vitamins, minerals, and fiber which are an essential part of a healthy diet. Carbohydrates are found in fruits, vegetables, whole grains, dairy products, legumes, and foods containing added sugars.  Eat healthy foods. You should make  an appointment to see a registered dietitian to help you create an eating plan that is right for you.  Lose weight if overweight.  Carry a medical alert card or wear your medical alert jewelry.  Carry a 15 gram carbohydrate snack with you at all times to treat low blood glucose (hypoglycemia). Some examples of 15 gram carbohydrate snacks include:  Glucose tablets, 3 or 4  Raisins, 2 tablespoons (24 grams)  Jelly beans, 6  Animal crackers, 8  Regular pop, 4 ounces (120 mL)  Gummy treats, 9  Recognize hypoglycemia. Hypoglycemia occurs with blood glucose levels of 70 mg/dL and below. The risk for hypoglycemia increases when fasting or skipping meals, during or after intense exercise, and during sleep. Hypoglycemia symptoms can include:  Tremors or shakes.  Decreased ability to concentrate.  Sweating.  Increased heart rate.  Headache.  Dry mouth.  Hunger.  Irritability.  Anxiety.  Restless sleep.  Altered speech or coordination.  Confusion.  Treat hypoglycemia promptly. If you are alert and able to safely swallow, follow the 15:15 rule:  Take 15-20 grams of rapid-acting glucose or carbohydrate.  Rapid-acting options include glucose gel, glucose tablets, or 4 ounces (120 mL) of fruit juice, regular soda, or low fat milk.  Check your blood glucose level 15 minutes after taking the glucose.  Take 15-20 grams more of glucose if the repeat blood glucose level is still 70 mg/dL or below.  Eat a meal or snack within 1 hour once blood glucose levels return to normal.  Be alert to feeling very thirsty and urinating more frequently than usual, which are early signs of hyperglycemia. An early awareness of hyperglycemia allows for prompt treatment. Treat hyperglycemia as directed by your health care provider.  Engage in at least 150 minutes of moderate-intensity physical activity a week, spread over at least 3 days of the week or as directed by your health care provider. In addition, you should engage in resistance exercise at least 2 times a week or as directed by your health care provider.  Adjust your medicine and food intake as needed if you start a new exercise or sport.  Follow your sick day plan at any time you are unable to eat or drink as usual.  Avoid tobacco use.  Limit alcohol intake to no more than 1 drink per day for nonpregnant women and 2 drinks per day for men. You should drink alcohol only when you are also eating food. Talk with your health care provider whether alcohol is safe for you. Tell your health care provider if you drink alcohol several times a week.  Follow up with your health care provider regularly.  Schedule an eye exam soon after the diagnosis of type 2 diabetes and then annually.  Perform daily skin and foot care. Examine your skin and feet daily for cuts, bruises, redness, nail problems, bleeding, blisters, or sores. A foot exam by a health care provider should be done annually.  Brush your teeth and gums at least twice a day and floss at least once a day. Follow up with your dentist regularly.  Share your diabetes management plan with your workplace or  school.  Stay up-to-date with immunizations.  Learn to manage stress.  Obtain ongoing diabetes education and support as needed.  Participate in, or seek rehabilitation as needed to maintain or improve independence and quality of life. Request a physical or occupational therapy referral if you are having foot or hand numbness or difficulties with grooming, dressing, eating, or   physical activity. SEEK MEDICAL CARE IF:   You are unable to eat food or drink fluids for more than 6 hours.  You have nausea and vomiting for more than 6 hours.  Your blood glucose level is over 240 mg/dL.  There is a change in mental status.  You develop an additional serious illness.  You have diarrhea for more than 6 hours.  You have been sick or have had a fever for a couple of days and are not getting better.  You have pain during any physical activity.  SEEK IMMEDIATE MEDICAL CARE IF:  You have difficulty breathing.  You have moderate to large ketone levels. MAKE SURE YOU:  Understand these instructions.  Will watch your condition.  Will get help right away if you are not doing well or get worse. Document Released: 06/15/2005 Document Revised: 06/20/2013 Document Reviewed: 01/12/2012 [facility] Patient Information 2015 [place], [place]. This information is not intended to replace advice given to you by your health care provider. Make sure you discuss any questions you have with your health care provider.

## 2013-12-25 NOTE — Assessment & Plan Note (Signed)
She has achieved her LDL goal 

## 2013-12-25 NOTE — Assessment & Plan Note (Addendum)

## 2013-12-25 NOTE — Assessment & Plan Note (Signed)
Her BP is well controlled Lytes and renal function are stable 

## 2013-12-25 NOTE — Progress Notes (Signed)
Pre visit review using our clinic review tool, if applicable. No additional management support is needed unless otherwise documented below in the visit note. 

## 2014-01-24 ENCOUNTER — Telehealth: Payer: Self-pay | Admitting: Internal Medicine

## 2014-01-24 NOTE — Telephone Encounter (Signed)
Stop the amlodipine and will recheck her BP on Firday

## 2014-01-24 NOTE — Telephone Encounter (Signed)
Patient came in the office and states that her amLODipine (NORVASC) 10 MG medication is making her sweat and gives her muscle spasms. She says it made her shake last night and caused her arms and feet to go numb. She wants to know if this can be changed as soon as possible. Patient was made an appointment for Friday afternoon but she is concerned about not having any medicine to take until then. Please advise.

## 2014-01-24 NOTE — Telephone Encounter (Signed)
Pt notified per MD 

## 2014-01-26 ENCOUNTER — Ambulatory Visit (INDEPENDENT_AMBULATORY_CARE_PROVIDER_SITE_OTHER): Payer: Medicare Other | Admitting: Internal Medicine

## 2014-01-26 ENCOUNTER — Encounter: Payer: Self-pay | Admitting: Internal Medicine

## 2014-01-26 VITALS — BP 138/82 | HR 95 | Temp 98.1°F | Resp 16 | Ht 59.0 in | Wt 185.8 lb

## 2014-01-26 DIAGNOSIS — I10 Essential (primary) hypertension: Secondary | ICD-10-CM

## 2014-01-26 MED ORDER — CARVEDILOL 12.5 MG PO TABS: 12.5000 mg | ORAL_TABLET | Freq: Two times a day (BID) | ORAL | Status: DC

## 2014-01-26 NOTE — Progress Notes (Signed)
   Subjective:    Patient ID: Brittany [name], female    DOB: [birth year], 80 y.o.   MRN: [id number]  Hypertension This is a chronic problem. The current episode started more than 1 year ago. The problem is unchanged. The problem is controlled. Pertinent negatives include no anxiety, blurred vision, chest pain, headaches, malaise/fatigue, neck pain, orthopnea, palpitations, peripheral edema, PND, shortness of breath or sweats. There are no associated agents to hypertension. Past treatments include calcium channel blockers, beta blockers and diuretics. The current treatment provides moderate improvement. Compliance problems include medication side effects (she feels like amlodipine is causing muscle cramps).       Review of Systems  Constitutional: Negative.  Negative for fever, chills, malaise/fatigue, diaphoresis, appetite change and fatigue.  HENT: Negative.   Eyes: Negative.  Negative for blurred vision.  Respiratory: Negative.  Negative for cough, choking, chest tightness and shortness of breath.   Cardiovascular: Negative.  Negative for chest pain, palpitations, orthopnea, leg swelling and PND.  Gastrointestinal: Negative.  Negative for nausea, vomiting, abdominal pain, diarrhea, constipation and blood in stool.  Endocrine: Negative.   Genitourinary: Negative.   Musculoskeletal: Negative.  Negative for arthralgias, back pain and neck pain.  Skin: Negative.  Negative for rash.  Allergic/Immunologic: Negative.   Neurological: Negative.  Negative for dizziness, tremors, syncope, speech difficulty, light-headedness, numbness and headaches.  Hematological: Negative.  Negative for adenopathy. Does not bruise/bleed easily.  Psychiatric/Behavioral: Negative.        Objective:   Physical Exam  Vitals reviewed. Constitutional: She is oriented to person, place, and time. She appears well-developed. No distress.  HENT:  Head: Normocephalic and atraumatic.  Mouth/Throat: Oropharynx is clear  and moist. No oropharyngeal exudate.  Eyes: Conjunctivae are normal. Right eye exhibits no discharge. Left eye exhibits no discharge. No scleral icterus.  Neck: Normal range of motion. Neck supple. No JVD present. No tracheal deviation present. No thyromegaly present.  Cardiovascular: Normal rate, regular rhythm, normal heart sounds and intact distal pulses.  Exam reveals no gallop and no friction rub.   No murmur heard. Pulmonary/Chest: Effort normal and breath sounds normal. No stridor. No respiratory distress. She has no wheezes. She has no rales. She exhibits no tenderness.  Abdominal: Soft. Bowel sounds are normal. She exhibits no distension and no mass. There is no tenderness. There is no rebound and no guarding.  Musculoskeletal: Normal range of motion. She exhibits no edema and no tenderness.  Lymphadenopathy:    She has no cervical adenopathy.  Neurological: She is oriented to person, place, and time.  Skin: Skin is warm and dry. No rash noted. She is not diaphoretic. No erythema. No pallor.    Lab Results  Component Value Date   WBC 4.9 12/25/2013   HGB 12.6 12/25/2013   HCT 38.0 12/25/2013   PLT 361.0 12/25/2013   GLUCOSE 101* 12/25/2013   CHOL 192 12/25/2013   TRIG 84.0 12/25/2013   HDL 66.50 12/25/2013   LDLDIRECT 119.8 04/02/2008   LDLCALC 109* 12/25/2013   ALT 15 10/04/2013   AST 18 10/04/2013   NA 140 12/25/2013   K 3.8 12/25/2013   CL 107 12/25/2013   CREATININE 0.8 12/25/2013   BUN 12 12/25/2013   CO2 27 12/25/2013   TSH 3.24 10/12/2012   HGBA1C 6.3 12/25/2013   MICROALBUR 0.3 04/18/2009        Assessment & Plan:

## 2014-01-26 NOTE — Progress Notes (Signed)
Pre visit review using our clinic review tool, if applicable. No additional management support is needed unless otherwise documented below in the visit note. 

## 2014-01-26 NOTE — Patient Instructions (Signed)

## 2014-01-28 ENCOUNTER — Encounter: Payer: Self-pay | Admitting: Internal Medicine

## 2014-01-28 NOTE — Assessment & Plan Note (Signed)
She will stop amlodipine Will increase the coreg dose to maintain good BP controlled

## 2014-02-23 ENCOUNTER — Emergency Department (HOSPITAL_COMMUNITY)
Admission: EM | Admit: 2014-02-23 | Discharge: 2014-02-23 | Disposition: A | Discharge status: Home / Self Care | Payer: Medicare Other | Attending: Emergency Medicine | Admitting: Emergency Medicine

## 2014-02-23 ENCOUNTER — Encounter (HOSPITAL_COMMUNITY): Payer: Self-pay | Admitting: Emergency Medicine

## 2014-02-23 DIAGNOSIS — I10 Essential (primary) hypertension: Secondary | ICD-10-CM | POA: Diagnosis present

## 2014-02-23 DIAGNOSIS — Z7982 Long term (current) use of aspirin: Secondary | ICD-10-CM | POA: Diagnosis not present

## 2014-02-23 DIAGNOSIS — IMO0002 Reserved for concepts with insufficient information to code with codable children: Secondary | ICD-10-CM | POA: Diagnosis not present

## 2014-02-23 DIAGNOSIS — M1710 Unilateral primary osteoarthritis, unspecified knee: Secondary | ICD-10-CM | POA: Insufficient documentation

## 2014-02-23 DIAGNOSIS — Z79899 Other long term (current) drug therapy: Secondary | ICD-10-CM | POA: Diagnosis not present

## 2014-02-23 DIAGNOSIS — Z853 Personal history of malignant neoplasm of breast: Secondary | ICD-10-CM | POA: Diagnosis not present

## 2014-02-23 DIAGNOSIS — E119 Type 2 diabetes mellitus without complications: Secondary | ICD-10-CM | POA: Insufficient documentation

## 2014-02-23 DIAGNOSIS — Z8719 Personal history of other diseases of the digestive system: Secondary | ICD-10-CM | POA: Insufficient documentation

## 2014-02-23 DIAGNOSIS — E785 Hyperlipidemia, unspecified: Secondary | ICD-10-CM | POA: Insufficient documentation

## 2014-02-23 NOTE — ED Notes (Signed)
Dr Ronnald Ramp called x 2 for Dr Winfred Leeds.Justin Mend

## 2014-02-23 NOTE — ED Provider Notes (Signed)
CSN: [id number]     Arrival date & time 02/23/14  1048 History   First MD Initiated Contact with Patient 02/23/14 1111     Chief Complaint  Patient presents with  . Hypertension     (Consider location/radiation/quality/duration/timing/severity/associated sxs/prior Treatment) HPI Complains of elevated blood pressure. The pressure was noted to be 199/102 this morning. Patient is asymptomatic. Her carvedilol was increased from 6.25 mg twice daily to 12.5 mg approximately 2 weeks ago. She was taken off of Norvasc. No treatment prior to coming here. No associated symptoms. Past Medical History  Diagnosis Date  . Hypertension   . Osteoporosis   . Hyperlipidemia   . Positive PPD     history off  . Diverticulosis of colon   . DJD (degenerative joint disease) of knee     Left  . Diabetes mellitus     type 2. 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 Cancer     right breast   Past Surgical History  Procedure Laterality Date  . Breast surgery  2010    + RT   Family History  Problem Relation Age of Onset  . Diabetes      1st degree relative  . Hypertension    . Stroke    . Cancer      cervical  . Lupus    . Coronary artery disease    . Heart disease Mother   . Diabetes Father    History  Substance Use Topics  . Smoking status: Never Smoker   . Smokeless tobacco: Never Used  . Alcohol Use: 1.2 oz/week    2 Glasses of wine per week   OB History   Grav Para Term Preterm Abortions TAB SAB Ect Mult Living                 Review of Systems  Constitutional: Negative.   HENT: Negative.   Respiratory: Negative.   Cardiovascular: Negative.   Gastrointestinal: Negative.   Musculoskeletal: Negative.   Skin: Negative.   Neurological: Negative.   Psychiatric/Behavioral: Negative.   All other systems reviewed and are negative.     Allergies  Amlodipine besylate; Olmesartan; Alendronate sodium; and Lisinopril  Home Medications   Prior to Admission medications    Medication Sig Start Date End Date Taking? Authorizing Provider  aspirin EC 81 MG EC tablet Take 81 mg by mouth daily.     Yes Historical Provider, MD  carvedilol (COREG) 12.5 MG tablet Take 12.5 mg by mouth 2 (two) times daily with a meal.   Yes Historical Provider, MD  Cholecalciferol 1000 UNIT tablet Take 1,000 Units by mouth daily.     Yes Historical Provider, MD  furosemide (LASIX) 20 MG tablet Take 20 mg by mouth daily.   Yes Historical Provider, MD  Glucosamine 500 MG TABS Take 500 mg by mouth daily.    Yes Historical Provider, MD  metFORMIN (GLUCOPHAGE) 500 MG tablet Take 500 mg by mouth daily with breakfast.   Yes Historical Provider, MD  potassium chloride SA (K-DUR,KLOR-CON) 20 MEQ tablet Take 20 mEq by mouth 3 (three) times daily.   Yes Historical Provider, MD   BP 165/98  Pulse 84  Temp(Src) 98.3 F (36.8 C) (Oral)  Resp 16  SpO2 97% Physical Exam  Nursing note and vitals reviewed. Constitutional: She is oriented to person, place, and time. She appears well-developed and well-nourished.  HENT:  Head: Normocephalic and atraumatic.  Eyes: Conjunctivae are normal. Pupils are equal, round,  and reactive to light.  Neck: Neck supple. No tracheal deviation present. No thyromegaly present.  Cardiovascular: Normal rate and regular rhythm.   No murmur heard. Pulmonary/Chest: Effort normal and breath sounds normal.  Abdominal: Soft. Bowel sounds are normal. She exhibits no distension. There is no tenderness.  Musculoskeletal: Normal range of motion. She exhibits no edema and no tenderness.  Neurological: She is alert and oriented to person, place, and time. No cranial nerve deficit. Coordination normal.  Gait normal  Skin: Skin is warm and dry. No rash noted.  Psychiatric: She has a normal mood and affect.    ED Course  Procedures (including critical care time) Labs Review Labs Reviewed - No data to display  Imaging Review No results found.   EKG Interpretation None       MDM  Spoke with [name] plan increase carvedilol to  18.75 mg twice daily. An appointment has been scheduled with [name] for Monday, September 1 at 8:15 AM torecheck blood pressure Final diagnoses:  None   diagnosis hypertension      Orlie Dakin, MD 02/23/14 1318

## 2014-02-23 NOTE — Discharge Instructions (Signed)
Hypertension Increase your carvedilol Coreg to 18.75 mg (three 6.25 mg tablets) twice daily. An appointment has been scheduled for you to see [name] in his office on Monday, 02/27/2014 at 8:15 AM Hypertension, commonly called high blood pressure, is when the force of blood pumping through your arteries is too strong. Your arteries are the blood vessels that carry blood from your heart throughout your body. A blood pressure reading consists of a higher number over a lower number, such as 110/72. The higher number (systolic) is the pressure inside your arteries when your heart pumps. The lower number (diastolic) is the pressure inside your arteries when your heart relaxes. Ideally you want your blood pressure below 120/80. Hypertension forces your heart to work harder to pump blood. Your arteries may become narrow or stiff. Having hypertension puts you at risk for heart disease, stroke, and other problems.  RISK FACTORS Some risk factors for high blood pressure are controllable. Others are not.  Risk factors you cannot control include:   Race. You may be at higher risk if you are African American.  Age. Risk increases with age.  Gender. Men are at higher risk than women before age 79 years. After age 59, women are at higher risk than men. Risk factors you can control include:  Not getting enough exercise or physical activity.  Being overweight.  Getting too much fat, sugar, calories, or salt in your diet.  Drinking too much alcohol. SIGNS AND SYMPTOMS Hypertension does not usually cause signs or symptoms. Extremely high blood pressure (hypertensive crisis) may cause headache, anxiety, shortness of breath, and nosebleed. DIAGNOSIS  To check if you have hypertension, your health care provider will measure your blood pressure while you are seated, with your arm held at the level of your heart. It should be measured at least twice using the same arm. Certain conditions can cause a difference in  blood pressure between your right and left arms. A blood pressure reading that is higher than normal on one occasion does not mean that you need treatment. If one blood pressure reading is high, ask your health care provider about having it checked again. TREATMENT  Treating high blood pressure includes making lifestyle changes and possibly taking medicine. Living a healthy lifestyle can help lower high blood pressure. You may need to change some of your habits. Lifestyle changes may include:  Following the DASH diet. This diet is high in fruits, vegetables, and whole grains. It is low in salt, red meat, and added sugars.  Getting at least 2 hours of brisk physical activity every week.  Losing weight if necessary.  Not smoking.  Limiting alcoholic beverages.  Learning ways to reduce stress. If lifestyle changes are not enough to get your blood pressure under control, your health care provider may prescribe medicine. You may need to take more than one. Work closely with your health care provider to understand the risks and benefits. HOME CARE INSTRUCTIONS  Have your blood pressure rechecked as directed by your health care provider.   Take medicines only as directed by your health care provider. Follow the directions carefully. Blood pressure medicines must be taken as prescribed. The medicine does not work as well when you skip doses. Skipping doses also puts you at risk for problems.   Do not smoke.   Monitor your blood pressure at home as directed by your health care provider. SEEK MEDICAL CARE IF:   You think you are having a reaction to medicines taken.  You  have recurrent headaches or feel dizzy.  You have swelling in your ankles.  You have trouble with your vision. SEEK IMMEDIATE MEDICAL CARE IF:  You develop a severe headache or confusion.  You have unusual weakness, numbness, or feel faint.  You have severe chest or abdominal pain.  You vomit repeatedly.  You  have trouble breathing. MAKE SURE YOU:   Understand these instructions.  Will watch your condition.  Will get help right away if you are not doing well or get worse. Document Released: 06/15/2005 Document Revised: 10/30/2013 Document Reviewed: 04/07/2013 [facility] Patient Information 2015 [place], [place]. This information is not intended to replace advice given to you by your health care provider. Make sure you discuss any questions you have with your health care provider.

## 2014-02-23 NOTE — ED Notes (Signed)
Pt sts that her BP was at home 199/103. Pt took her coreg at home. Denies any pain or blurred vision.

## 2014-02-26 ENCOUNTER — Encounter: Payer: Self-pay | Admitting: Internal Medicine

## 2014-02-26 ENCOUNTER — Ambulatory Visit (INDEPENDENT_AMBULATORY_CARE_PROVIDER_SITE_OTHER): Payer: Medicare Other | Admitting: Internal Medicine

## 2014-02-26 VITALS — BP 168/98 | HR 73 | Temp 98.3°F | Resp 18 | Wt 184.0 lb

## 2014-02-26 DIAGNOSIS — I10 Essential (primary) hypertension: Secondary | ICD-10-CM

## 2014-02-26 MED ORDER — HYDRALAZINE HCL 25 MG PO TABS: 25.0000 mg | ORAL_TABLET | Freq: Three times a day (TID) | ORAL | Status: DC

## 2014-02-26 MED ORDER — CHLORTHALIDONE 25 MG PO TABS: 25.0000 mg | ORAL_TABLET | Freq: Every day | ORAL | Status: DC

## 2014-02-26 NOTE — Progress Notes (Signed)
Pre visit review using our clinic review tool, if applicable. No additional management support is needed unless otherwise documented below in the visit note. 

## 2014-02-26 NOTE — Patient Instructions (Signed)

## 2014-02-26 NOTE — Progress Notes (Signed)
   Subjective:    Patient ID: Brittany [name], female    DOB: 01-02-1929, 80 y.o.   MRN: [id number]  Hypertension This is a chronic problem. The current episode started more than 1 year ago. The problem has been gradually worsening since onset. The problem is uncontrolled. Associated symptoms include anxiety. Pertinent negatives include no blurred vision, chest pain, headaches, malaise/fatigue, neck pain, orthopnea, palpitations, peripheral edema, PND, shortness of breath or sweats. Past treatments include diuretics and beta blockers. The current treatment provides mild improvement. Compliance problems include diet and exercise.       Review of Systems  Constitutional: Negative.  Negative for fever, chills, malaise/fatigue, diaphoresis, appetite change and fatigue.  HENT: Negative.   Eyes: Negative.  Negative for blurred vision and visual disturbance.  Respiratory: Negative.  Negative for apnea, cough, choking, chest tightness, shortness of breath, wheezing and stridor.   Cardiovascular: Negative.  Negative for chest pain, palpitations, orthopnea, leg swelling and PND.  Gastrointestinal: Negative.  Negative for nausea, vomiting, abdominal pain, diarrhea and constipation.  Endocrine: Negative.   Genitourinary: Negative.   Musculoskeletal: Negative.  Negative for arthralgias, back pain and neck pain.  Skin: Negative.   Allergic/Immunologic: Negative.   Neurological: Negative.  Negative for dizziness, tremors, weakness, light-headedness and headaches.  Hematological: Negative.  Negative for adenopathy. Does not bruise/bleed easily.  Psychiatric/Behavioral: Negative.        Objective:   Physical Exam  Vitals reviewed. Constitutional: She is oriented to person, place, and time. She appears well-developed and well-nourished. No distress.  HENT:  Head: Normocephalic and atraumatic.  Mouth/Throat: Oropharynx is clear and moist. No oropharyngeal exudate.  Eyes: Conjunctivae are normal. Right  eye exhibits no discharge. Left eye exhibits no discharge. No scleral icterus.  Neck: Normal range of motion. Neck supple. No JVD present. No tracheal deviation present. No thyromegaly present.  Cardiovascular: Normal rate, regular rhythm, normal heart sounds and intact distal pulses.  Exam reveals no gallop and no friction rub.   No murmur heard. Pulmonary/Chest: Effort normal and breath sounds normal. No stridor. No respiratory distress. She has no wheezes. She has no rales. She exhibits no tenderness.  Abdominal: Soft. Bowel sounds are normal. She exhibits no distension and no mass. There is no tenderness. There is no rebound and no guarding.  Musculoskeletal: Normal range of motion. She exhibits no edema and no tenderness.  Lymphadenopathy:    She has no cervical adenopathy.  Neurological: She is oriented to person, place, and time.  Skin: Skin is warm and dry. No rash noted. She is not diaphoretic. No erythema. No pallor.  Psychiatric: She has a normal mood and affect. Her behavior is normal. Judgment and thought content normal.     Lab Results  Component Value Date   WBC 4.9 12/25/2013   HGB 12.6 12/25/2013   HCT 38.0 12/25/2013   PLT 361.0 12/25/2013   GLUCOSE 101* 12/25/2013   CHOL 192 12/25/2013   TRIG 84.0 12/25/2013   HDL 66.50 12/25/2013   LDLDIRECT 119.8 04/02/2008   LDLCALC 109* 12/25/2013   ALT 15 10/04/2013   AST 18 10/04/2013   NA 140 12/25/2013   K 3.8 12/25/2013   CL 107 12/25/2013   CREATININE 0.8 12/25/2013   BUN 12 12/25/2013   CO2 27 12/25/2013   TSH 3.24 10/12/2012   HGBA1C 6.3 12/25/2013   MICROALBUR 0.3 04/18/2009       Assessment & Plan:

## 2014-02-26 NOTE — Assessment & Plan Note (Signed)
Her BP is not well controlled I think chlorthalidone would be a better diuretic for her than lasix to control the BP She has had side effects from the ARB and ACEI category Will start hydralazine for better BP control

## 2014-03-08 ENCOUNTER — Telehealth: Payer: Self-pay

## 2014-03-08 NOTE — Telephone Encounter (Signed)
Patient called c.o low BP reading x several days, lowest BP has been 105/62 with some dizziness. Pt thinks mediation dosage may be too much and would like MD advisement.

## 2014-03-08 NOTE — Telephone Encounter (Signed)
Pt notified per MD 

## 2014-03-08 NOTE — Telephone Encounter (Signed)
Cut the chlorthalidone in 1/2

## 2014-04-09 ENCOUNTER — Ambulatory Visit: Payer: Medicare Other | Admitting: Internal Medicine

## 2014-04-10 ENCOUNTER — Encounter: Payer: Self-pay | Admitting: Internal Medicine

## 2014-04-10 ENCOUNTER — Ambulatory Visit (INDEPENDENT_AMBULATORY_CARE_PROVIDER_SITE_OTHER): Payer: Medicare Other | Admitting: Internal Medicine

## 2014-04-10 ENCOUNTER — Ambulatory Visit: Payer: Medicare Other | Admitting: Internal Medicine

## 2014-04-10 VITALS — BP 136/82 | HR 72 | Temp 98.2°F | Resp 16 | Ht 59.0 in | Wt 178.0 lb

## 2014-04-10 DIAGNOSIS — I10 Essential (primary) hypertension: Secondary | ICD-10-CM

## 2014-04-10 NOTE — Progress Notes (Signed)
Pre visit review using our clinic review tool, if applicable. No additional management support is needed unless otherwise documented below in the visit note. 

## 2014-04-10 NOTE — Patient Instructions (Signed)

## 2014-04-10 NOTE — Progress Notes (Signed)
   Subjective:    Patient ID: Brittany [name], female    DOB: Jul 07, 1928, 80 y.o.   MRN: [id number]  Hypertension This is a chronic problem. The current episode started more than 1 year ago. The problem has been gradually improving since onset. The problem is controlled. Pertinent negatives include no anxiety, blurred vision, chest pain, headaches, malaise/fatigue, neck pain, orthopnea, palpitations, peripheral edema, PND, shortness of breath or sweats. Past treatments include beta blockers and diuretics. The current treatment provides moderate improvement. There are no compliance problems.       Review of Systems  Constitutional: Negative.  Negative for fever, chills, malaise/fatigue, diaphoresis, appetite change and fatigue.  HENT: Negative.   Eyes: Negative.  Negative for blurred vision.  Respiratory: Negative.  Negative for cough, choking, chest tightness, shortness of breath and stridor.   Cardiovascular: Negative.  Negative for chest pain, palpitations, orthopnea, leg swelling and PND.  Gastrointestinal: Negative.  Negative for nausea, vomiting, abdominal pain, diarrhea, constipation and blood in stool.  Endocrine: Negative.   Genitourinary: Negative.   Musculoskeletal: Negative.  Negative for neck pain.  Skin: Negative.   Allergic/Immunologic: Negative.   Neurological: Negative.  Negative for dizziness, tremors, syncope, weakness, light-headedness, numbness and headaches.  Hematological: Negative.  Negative for adenopathy. Does not bruise/bleed easily.  Psychiatric/Behavioral: Negative.   All other systems reviewed and are negative.      Objective:   Physical Exam  Vitals reviewed. Constitutional: She is oriented to person, place, and time. She appears well-developed and well-nourished. No distress.  HENT:  Head: Normocephalic and atraumatic.  Mouth/Throat: Oropharynx is clear and moist. No oropharyngeal exudate.  Eyes: Conjunctivae are normal. Right eye exhibits no  discharge. Left eye exhibits no discharge. No scleral icterus.  Neck: Normal range of motion. Neck supple. No JVD present. No tracheal deviation present. No thyromegaly present.  Cardiovascular: Normal rate, regular rhythm, normal heart sounds and intact distal pulses.  Exam reveals no gallop and no friction rub.   No murmur heard. Pulmonary/Chest: Effort normal and breath sounds normal. No stridor. No respiratory distress. She has no wheezes. She has no rales. She exhibits no tenderness.  Abdominal: Soft. Bowel sounds are normal. She exhibits no distension and no mass. There is no tenderness. There is no rebound and no guarding.  Musculoskeletal: Normal range of motion. She exhibits no edema and no tenderness.  Lymphadenopathy:    She has no cervical adenopathy.  Neurological: She is oriented to person, place, and time.  Skin: Skin is warm and dry. No rash noted. She is not diaphoretic. No erythema. No pallor.     Lab Results  Component Value Date   WBC 4.9 12/25/2013   HGB 12.6 12/25/2013   HCT 38.0 12/25/2013   PLT 361.0 12/25/2013   GLUCOSE 101* 12/25/2013   CHOL 192 12/25/2013   TRIG 84.0 12/25/2013   HDL 66.50 12/25/2013   LDLDIRECT 119.8 04/02/2008   LDLCALC 109* 12/25/2013   ALT 15 10/04/2013   AST 18 10/04/2013   NA 140 12/25/2013   K 3.8 12/25/2013   CL 107 12/25/2013   CREATININE 0.8 12/25/2013   BUN 12 12/25/2013   CO2 27 12/25/2013   TSH 3.24 10/12/2012   HGBA1C 6.3 12/25/2013   MICROALBUR 0.3 04/18/2009       Assessment & Plan:

## 2014-04-10 NOTE — Assessment & Plan Note (Signed)
Her BP is well controlled 

## 2014-04-26 ENCOUNTER — Ambulatory Visit: Payer: Medicare Other | Admitting: Internal Medicine

## 2014-05-01 ENCOUNTER — Other Ambulatory Visit: Payer: Self-pay

## 2014-05-01 MED ORDER — METFORMIN HCL 500 MG PO TABS: 500.0000 mg | ORAL_TABLET | Freq: Every day | ORAL | Status: DC

## 2014-07-25 ENCOUNTER — Ambulatory Visit (INDEPENDENT_AMBULATORY_CARE_PROVIDER_SITE_OTHER): Payer: Commercial Managed Care - HMO | Admitting: Internal Medicine

## 2014-07-25 ENCOUNTER — Encounter: Payer: Self-pay | Admitting: Internal Medicine

## 2014-07-25 ENCOUNTER — Other Ambulatory Visit (INDEPENDENT_AMBULATORY_CARE_PROVIDER_SITE_OTHER): Payer: Commercial Managed Care - HMO

## 2014-07-25 VITALS — BP 154/84 | HR 77 | Temp 98.2°F | Resp 16 | Ht 59.0 in | Wt 182.0 lb

## 2014-07-25 DIAGNOSIS — E785 Hyperlipidemia, unspecified: Secondary | ICD-10-CM | POA: Diagnosis not present

## 2014-07-25 DIAGNOSIS — E118 Type 2 diabetes mellitus with unspecified complications: Secondary | ICD-10-CM

## 2014-07-25 DIAGNOSIS — I10 Essential (primary) hypertension: Secondary | ICD-10-CM

## 2014-07-25 LAB — URINALYSIS, ROUTINE W REFLEX MICROSCOPIC
Bilirubin Urine: NEGATIVE
Hgb urine dipstick: NEGATIVE
KETONES UR: NEGATIVE
NITRITE: NEGATIVE
Specific Gravity, Urine: 1.025 (ref 1.000–1.030)
Total Protein, Urine: NEGATIVE
URINE GLUCOSE: NEGATIVE
Urobilinogen, UA: 0.2 (ref 0.0–1.0)
pH: 5.5 (ref 5.0–8.0)

## 2014-07-25 LAB — BASIC METABOLIC PANEL
BUN: 15 mg/dL (ref 6–23)
CHLORIDE: 102 meq/L (ref 96–112)
CO2: 27 mEq/L (ref 19–32)
Calcium: 9.6 mg/dL (ref 8.4–10.5)
Creatinine, Ser: 0.8 mg/dL (ref 0.40–1.20)
GFR: 87.93 mL/min (ref >60.00)
Glucose, Bld: 111 mg/dL — ABNORMAL HIGH (ref 70–99)
Potassium: 3.6 mEq/L (ref 3.5–5.1)
SODIUM: 137 meq/L (ref 135–145)

## 2014-07-25 LAB — LIPID PANEL
CHOL/HDL RATIO: 3
CHOLESTEROL: 204 mg/dL — AB (ref 0–200)
HDL: 74 mg/dL (ref >39.00)
LDL Cholesterol: 98 mg/dL (ref 0–99)
NONHDL: 130
TRIGLYCERIDES: 161 mg/dL — AB (ref 0.0–149.0)
VLDL: 32.2 mg/dL (ref 0.0–40.0)

## 2014-07-25 LAB — MICROALBUMIN / CREATININE URINE RATIO
Creatinine,U: 145.8 mg/dL
Microalb Creat Ratio: 0.3 mg/g (ref 0.0–30.0)
Microalb, Ur: 0.5 mg/dL (ref 0.0–1.9)

## 2014-07-25 LAB — HEMOGLOBIN A1C: Hgb A1c MFr Bld: 6.6 % — ABNORMAL HIGH (ref 4.6–6.5)

## 2014-07-25 LAB — TSH: TSH: 3.23 u[IU]/mL (ref 0.35–4.50)

## 2014-07-25 MED ORDER — ATORVASTATIN CALCIUM 20 MG PO TABS: 20.0000 mg | ORAL_TABLET | Freq: Every day | ORAL | Status: DC

## 2014-07-25 MED ORDER — FUROSEMIDE 20 MG PO TABS: 20.0000 mg | ORAL_TABLET | Freq: Two times a day (BID) | ORAL | Status: DC

## 2014-07-25 NOTE — Progress Notes (Signed)
   Subjective:    Patient ID: Brittany [name], female    DOB: [birth year], 81 y.o.   MRN: [id number]  Hypertension This is a chronic problem. The current episode started more than 1 year ago. The problem is unchanged. The problem is controlled. Pertinent negatives include no anxiety, blurred vision, chest pain, headaches, malaise/fatigue, neck pain, orthopnea, palpitations, peripheral edema, PND, shortness of breath or sweats. There are no associated agents to hypertension. Past treatments include beta blockers and diuretics. The current treatment provides moderate improvement. Compliance problems include medication side effects.       Review of Systems  Constitutional: Negative.  Negative for fever, chills, malaise/fatigue, diaphoresis, appetite change and fatigue.  HENT: Negative.   Eyes: Negative.  Negative for blurred vision.  Respiratory: Negative.  Negative for cough, choking, chest tightness, shortness of breath and stridor.   Cardiovascular: Negative.  Negative for chest pain, palpitations, orthopnea, leg swelling and PND.  Gastrointestinal: Negative.  Negative for nausea, vomiting, abdominal pain, diarrhea and blood in stool.  Endocrine: Negative.   Genitourinary: Negative.   Musculoskeletal: Negative.  Negative for back pain, arthralgias and neck pain.  Skin: Negative.  Negative for rash.  Allergic/Immunologic: Negative.   Neurological: Negative.  Negative for dizziness, tremors, seizures, speech difficulty, light-headedness and headaches.  Hematological: Negative.  Negative for adenopathy. Does not bruise/bleed easily.  Psychiatric/Behavioral: Negative.        Objective:   Physical Exam  Constitutional: She is oriented to person, place, and time. She appears well-developed and well-nourished. No distress.  HENT:  Head: Normocephalic and atraumatic.  Mouth/Throat: Oropharynx is clear and moist. No oropharyngeal exudate.  Eyes: Conjunctivae are normal. Right eye exhibits no  discharge. Left eye exhibits no discharge. No scleral icterus.  Neck: Normal range of motion. Neck supple. No JVD present. No tracheal deviation present. No thyromegaly present.  Cardiovascular: Normal rate, regular rhythm, normal heart sounds and intact distal pulses.  Exam reveals no gallop and no friction rub.   No murmur heard. Pulmonary/Chest: Effort normal and breath sounds normal. No stridor. No respiratory distress. She has no wheezes. She has no rales. She exhibits no tenderness.  Abdominal: Soft. Bowel sounds are normal. She exhibits no distension and no mass. There is no tenderness. There is no rebound and no guarding.  Musculoskeletal: Normal range of motion. She exhibits no edema or tenderness.  Lymphadenopathy:    She has no cervical adenopathy.  Neurological: She is oriented to person, place, and time.  Skin: Skin is warm and dry. No rash noted. She is not diaphoretic. No erythema. No pallor.  Psychiatric: She has a normal mood and affect. Her behavior is normal. Judgment and thought content normal.  Vitals reviewed.     Lab Results  Component Value Date   WBC 4.9 12/25/2013   HGB 12.6 12/25/2013   HCT 38.0 12/25/2013   PLT 361.0 12/25/2013   GLUCOSE 101* 12/25/2013   CHOL 192 12/25/2013   TRIG 84.0 12/25/2013   HDL 66.50 12/25/2013   LDLDIRECT 119.8 04/02/2008   LDLCALC 109* 12/25/2013   ALT 15 10/04/2013   AST 18 10/04/2013   NA 140 12/25/2013   K 3.8 12/25/2013   CL 107 12/25/2013   CREATININE 0.8 12/25/2013   BUN 12 12/25/2013   CO2 27 12/25/2013   TSH 3.24 10/12/2012   HGBA1C 6.3 12/25/2013   MICROALBUR 0.3 04/18/2009      Assessment & Plan:

## 2014-07-25 NOTE — Patient Instructions (Signed)

## 2014-07-25 NOTE — Progress Notes (Signed)
Pre visit review using our clinic review tool, if applicable. No additional management support is needed unless otherwise documented below in the visit note. 

## 2014-07-26 ENCOUNTER — Encounter: Payer: Self-pay | Admitting: Internal Medicine

## 2014-07-28 NOTE — Assessment & Plan Note (Signed)
Her blood sugars are well controlled 

## 2014-07-28 NOTE — Assessment & Plan Note (Signed)
She complains of side effects from chlorthalidone and she will not take it anymore I think she needs a diuretic for BP control so will start BID lasix

## 2014-08-20 ENCOUNTER — Other Ambulatory Visit: Payer: Self-pay | Admitting: Internal Medicine

## 2014-08-20 DIAGNOSIS — Z9889 Other specified postprocedural states: Secondary | ICD-10-CM

## 2014-08-20 DIAGNOSIS — Z853 Personal history of malignant neoplasm of breast: Secondary | ICD-10-CM

## 2014-09-24 ENCOUNTER — Ambulatory Visit
Admission: RE | Admit: 2014-09-24 | Discharge: 2014-09-24 | Disposition: A | Discharge status: Home / Self Care | Payer: Commercial Managed Care - HMO | Source: Ambulatory Visit | Attending: Internal Medicine | Admitting: Internal Medicine

## 2014-09-24 DIAGNOSIS — Z853 Personal history of malignant neoplasm of breast: Secondary | ICD-10-CM

## 2014-09-24 DIAGNOSIS — Z9889 Other specified postprocedural states: Secondary | ICD-10-CM

## 2014-09-24 LAB — HM MAMMOGRAPHY: HM Mammogram: NORMAL

## 2014-11-27 ENCOUNTER — Encounter: Payer: Self-pay | Admitting: Internal Medicine

## 2014-11-27 ENCOUNTER — Ambulatory Visit (INDEPENDENT_AMBULATORY_CARE_PROVIDER_SITE_OTHER): Payer: Commercial Managed Care - HMO | Admitting: Internal Medicine

## 2014-11-27 ENCOUNTER — Other Ambulatory Visit (INDEPENDENT_AMBULATORY_CARE_PROVIDER_SITE_OTHER): Payer: Commercial Managed Care - HMO

## 2014-11-27 VITALS — BP 150/102 | HR 74 | Temp 98.0°F | Resp 16 | Ht 59.0 in | Wt 185.8 lb

## 2014-11-27 DIAGNOSIS — E118 Type 2 diabetes mellitus with unspecified complications: Secondary | ICD-10-CM

## 2014-11-27 DIAGNOSIS — I10 Essential (primary) hypertension: Secondary | ICD-10-CM

## 2014-11-27 LAB — BASIC METABOLIC PANEL
BUN: 12 mg/dL (ref 6–23)
CALCIUM: 9.4 mg/dL (ref 8.4–10.5)
CO2: 23 mEq/L (ref 19–32)
Chloride: 109 mEq/L (ref 96–112)
Creatinine, Ser: 0.84 mg/dL (ref 0.40–1.20)
GFR: 83.05 mL/min (ref >60.00)
GLUCOSE: 107 mg/dL — AB (ref 70–99)
POTASSIUM: 4.1 meq/L (ref 3.5–5.1)
Sodium: 140 mEq/L (ref 135–145)

## 2014-11-27 LAB — HEMOGLOBIN A1C: Hgb A1c MFr Bld: 6.2 % (ref 4.6–6.5)

## 2014-11-27 MED ORDER — CLONIDINE HCL 0.1 MG PO TABS: 0.1000 mg | ORAL_TABLET | Freq: Three times a day (TID) | ORAL | Status: DC

## 2014-11-27 NOTE — Progress Notes (Signed)
Pre visit review using our clinic review tool, if applicable. No additional management support is needed unless otherwise documented below in the visit note. 

## 2014-11-27 NOTE — Patient Instructions (Signed)

## 2014-11-27 NOTE — Progress Notes (Signed)
Subjective:  Patient ID: Brittany [name], female    DOB: August 17, 1928  Age: 81 y.o. MRN: [id number]  CC: Hypertension   HPI Brittany [name] presents for a BP check - her BP has not been well controlled at home with BP consistently > 150/100 but she feels well and offers no complaints.  Outpatient Prescriptions Prior to Visit  Medication Sig Dispense Refill  . aspirin EC 81 MG EC tablet Take 81 mg by mouth daily.      Marland Kitchen atorvastatin (LIPITOR) 20 MG tablet Take 1 tablet (20 mg total) by mouth daily. 90 tablet 3  . carvedilol (COREG) 12.5 MG tablet Take 12.5 mg by mouth 2 (two) times daily with a meal.    . Cholecalciferol 1000 UNIT tablet Take 1,000 Units by mouth daily.      . furosemide (LASIX) 20 MG tablet Take 1 tablet (20 mg total) by mouth 2 (two) times daily. 180 tablet 1  . Glucosamine 500 MG TABS Take 500 mg by mouth daily.     . metFORMIN (GLUCOPHAGE) 500 MG tablet Take 1 tablet (500 mg total) by mouth daily with breakfast. 90 tablet 3  . potassium chloride SA (K-DUR,KLOR-CON) 20 MEQ tablet Take 20 mEq by mouth 3 (three) times daily.     No facility-administered medications prior to visit.    ROS Review of Systems  Constitutional: Negative.  Negative for fever, chills, diaphoresis, appetite change and fatigue.  HENT: Negative.   Eyes: Negative.   Respiratory: Negative.  Negative for cough, choking, chest tightness, shortness of breath and stridor.   Cardiovascular: Negative.  Negative for chest pain, palpitations and leg swelling.  Gastrointestinal: Negative.  Negative for abdominal pain.  Endocrine: Negative.   Genitourinary: Negative.   Musculoskeletal: Negative.  Negative for myalgias, back pain, arthralgias and neck pain.  Skin: Negative.   Allergic/Immunologic: Negative.   Neurological: Negative.  Negative for dizziness, tremors, seizures, syncope, light-headedness and numbness.  Hematological: Negative.  Negative for adenopathy. Does not bruise/bleed easily.    Psychiatric/Behavioral: Negative.     Objective:  BP 150/102 mmHg  Pulse 74  Temp(Src) 98 F (36.7 C) (Oral)  Resp 16  Ht 4\' 11"  (1.499 m)  Wt 185 lb 12 oz (84.256 kg)  BMI 37.50 kg/m2  SpO2 96%  BP Readings from Last 3 Encounters:  11/27/14 150/102  07/25/14 154/84  04/10/14 136/82    Wt Readings from Last 3 Encounters:  11/27/14 185 lb 12 oz (84.256 kg)  07/25/14 182 lb (82.555 kg)  04/10/14 178 lb (80.74 kg)    Physical Exam  Constitutional: She is oriented to person, place, and time. She appears well-developed and well-nourished. No distress.  HENT:  Head: Normocephalic and atraumatic.  Mouth/Throat: Oropharynx is clear and moist. No oropharyngeal exudate.  Eyes: Conjunctivae are normal. Right eye exhibits no discharge. Left eye exhibits no discharge. No scleral icterus.  Neck: Normal range of motion. Neck supple. No JVD present. No tracheal deviation present. No thyromegaly present.  Cardiovascular: Normal rate, regular rhythm, normal heart sounds and intact distal pulses.  Exam reveals no gallop and no friction rub.   No murmur heard. Pulmonary/Chest: Effort normal and breath sounds normal. No stridor. No respiratory distress. She has no wheezes. She has no rales. She exhibits no tenderness.  Abdominal: Soft. Bowel sounds are normal. She exhibits no distension and no mass. There is no tenderness. There is no rebound and no guarding.  Musculoskeletal: Normal range of motion. She exhibits no edema or  tenderness.  Lymphadenopathy:    She has no cervical adenopathy.  Neurological: She is oriented to person, place, and time.  Skin: Skin is warm and dry. No rash noted. She is not diaphoretic. No erythema. No pallor.  Psychiatric: She has a normal mood and affect. Her behavior is normal. Judgment and thought content normal.  Vitals reviewed.   Lab Results  Component Value Date   WBC 4.9 12/25/2013   HGB 12.6 12/25/2013   HCT 38.0 12/25/2013   PLT 361.0 12/25/2013    GLUCOSE 111* 07/25/2014   CHOL 204* 07/25/2014   TRIG 161.0* 07/25/2014   HDL 74.00 07/25/2014   LDLDIRECT 119.8 04/02/2008   LDLCALC 98 07/25/2014   ALT 15 10/04/2013   AST 18 10/04/2013   NA 137 07/25/2014   K 3.6 07/25/2014   CL 102 07/25/2014   CREATININE 0.80 07/25/2014   BUN 15 07/25/2014   CO2 27 07/25/2014   TSH 3.23 07/25/2014   HGBA1C 6.6* 07/25/2014   MICROALBUR 0.5 07/25/2014    Mm Diag Breast Tomo Bilateral  09/24/2014   CLINICAL DATA:  History of right lumpectomy for breast cancer 2010. Asymptomatic. Original diagnosis 2008.  EXAM: DIGITAL DIAGNOSTIC bilateral MAMMOGRAM WITH 3D TOMOSYNTHESIS AND CAD  COMPARISON:  [facility]  ACR Breast Density Category a: The breast tissue is almost entirely fatty.  FINDINGS: Right lumpectomy changes are reidentified. No new evidence for malignancy is identified in either breast.  Mammographic images were processed with CAD.  IMPRESSION: No evidence for malignancy in either breast. Right lumpectomy changes reidentified.  RECOMMENDATION: Diagnostic mammogram is suggested in 1 year. (Code:[id number]-B-01Y)  I have discussed the findings and recommendations with the patient. Results were also provided in writing at the conclusion of the visit. If applicable, a reminder letter will be sent to the patient regarding the next appointment.  BI-RADS CATEGORY  2: Benign.   Electronically Signed   By: Conchita Paris M.D.   On: 09/24/2014 11:08    Assessment & Plan:   Keagan was seen today for hypertension.  Diagnoses and all orders for this visit:  Essential hypertension, benign - her BP is not well controlled, she does not tolerate ARB's or ACEI's, will add clonidine to her current regimen, will monitor her lytes and renal function today Orders: -     cloNIDine (CATAPRES) 0.1 MG tablet; Take 1 tablet (0.1 mg total) by mouth 3 (three) times daily. -     Basic metabolic panel; Future  Type II diabetes mellitus with manifestations - she is doing well on  metformin, will recheck her A1C and will address if indicated Orders: -     Basic metabolic panel; Future -     Hemoglobin A1c; Future  I am having Ms. Vinluan start on cloNIDine. I am also having her maintain her aspirin EC, Cholecalciferol, Glucosamine, carvedilol, potassium chloride SA, metFORMIN, furosemide, and atorvastatin.  Meds ordered this encounter  Medications  . cloNIDine (CATAPRES) 0.1 MG tablet    Sig: Take 1 tablet (0.1 mg total) by mouth 3 (three) times daily.    Dispense:  90 tablet    Refill:  11     Follow-up: Return in about 6 weeks (around 01/08/2015).  Scarlette Calico, MD

## 2014-11-29 ENCOUNTER — Encounter (HOSPITAL_COMMUNITY): Payer: Self-pay | Admitting: Emergency Medicine

## 2014-11-29 ENCOUNTER — Emergency Department (HOSPITAL_COMMUNITY)
Admission: EM | Admit: 2014-11-29 | Discharge: 2014-11-29 | Disposition: A | Discharge status: Home / Self Care | Payer: Commercial Managed Care - HMO | Attending: Emergency Medicine | Admitting: Emergency Medicine

## 2014-11-29 DIAGNOSIS — Z7982 Long term (current) use of aspirin: Secondary | ICD-10-CM | POA: Insufficient documentation

## 2014-11-29 DIAGNOSIS — Z8719 Personal history of other diseases of the digestive system: Secondary | ICD-10-CM | POA: Insufficient documentation

## 2014-11-29 DIAGNOSIS — I10 Essential (primary) hypertension: Secondary | ICD-10-CM | POA: Insufficient documentation

## 2014-11-29 DIAGNOSIS — Z853 Personal history of malignant neoplasm of breast: Secondary | ICD-10-CM | POA: Insufficient documentation

## 2014-11-29 DIAGNOSIS — Z79899 Other long term (current) drug therapy: Secondary | ICD-10-CM | POA: Diagnosis not present

## 2014-11-29 DIAGNOSIS — E785 Hyperlipidemia, unspecified: Secondary | ICD-10-CM | POA: Insufficient documentation

## 2014-11-29 DIAGNOSIS — Z8739 Personal history of other diseases of the musculoskeletal system and connective tissue: Secondary | ICD-10-CM | POA: Diagnosis not present

## 2014-11-29 DIAGNOSIS — E119 Type 2 diabetes mellitus without complications: Secondary | ICD-10-CM | POA: Insufficient documentation

## 2014-11-29 LAB — BASIC METABOLIC PANEL
Anion gap: 11 (ref 5–15)
BUN: 11 mg/dL (ref 6–20)
CO2: 22 mmol/L (ref 22–32)
CREATININE: 0.94 mg/dL (ref 0.44–1.00)
Calcium: 9.2 mg/dL (ref 8.9–10.3)
Chloride: 106 mmol/L (ref 101–111)
GFR calc non Af Amer: 54 mL/min — ABNORMAL LOW (ref >60)
Glucose, Bld: 109 mg/dL — ABNORMAL HIGH (ref 65–99)
Potassium: 3.6 mmol/L (ref 3.5–5.1)
SODIUM: 139 mmol/L (ref 135–145)

## 2014-11-29 LAB — URINALYSIS, ROUTINE W REFLEX MICROSCOPIC
Bilirubin Urine: NEGATIVE
GLUCOSE, UA: NEGATIVE mg/dL
Hgb urine dipstick: NEGATIVE
Ketones, ur: NEGATIVE mg/dL
Nitrite: NEGATIVE
Protein, ur: NEGATIVE mg/dL
Specific Gravity, Urine: 1.025 (ref 1.005–1.030)
UROBILINOGEN UA: 0.2 mg/dL (ref 0.0–1.0)
pH: 5.5 (ref 5.0–8.0)

## 2014-11-29 LAB — URINE MICROSCOPIC-ADD ON

## 2014-11-29 LAB — CBC
HCT: 35.7 % — ABNORMAL LOW (ref 36.0–46.0)
HEMOGLOBIN: 11.6 g/dL — AB (ref 12.0–15.0)
MCH: 27.6 pg (ref 26.0–34.0)
MCHC: 32.5 g/dL (ref 30.0–36.0)
MCV: 85 fL (ref 78.0–100.0)
PLATELETS: 398 10*3/uL (ref 150–400)
RBC: 4.2 MIL/uL (ref 3.87–5.11)
RDW: 14.6 % (ref 11.5–15.5)
WBC: 4.7 10*3/uL (ref 4.0–10.5)

## 2014-11-29 NOTE — ED Provider Notes (Signed)
CSN: [id number]     Arrival date & time 11/29/14  0705 History   First MD Initiated Contact with Patient 11/29/14 [phone]     Chief Complaint  Patient presents with  . Hypertension     (Consider location/radiation/quality/duration/timing/severity/associated sxs/prior Treatment) HPI  Pt presents with concern for elevated blood pressure.  Pt sates she was sweating this morning, took her bp and it was elevated.  No chest pain, no shortness of breath.  She states she had some elevation in blood pressure earlier in the week- saw her doctor and they started her on a new blood pressure med- she is unsure what the medication is. She also states she has been under more stress than usual due to the recent loss of her son and moving his belongings out of his house.  No focal weakness, no changes in vision or speech.  There are no other associated systemic symptoms, there are no other alleviating or modifying factors.   Past Medical History  Diagnosis Date  . Hypertension   . Osteoporosis   . Hyperlipidemia   . Positive PPD     history off  . Diverticulosis of colon   . DJD (degenerative joint disease) of knee     Left  . Diabetes mellitus     type 2. 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 Cancer     right breast   Past Surgical History  Procedure Laterality Date  . Breast surgery  2010    + RT   Family History  Problem Relation Age of Onset  . Diabetes      1st degree relative  . Hypertension    . Stroke    . Cancer      cervical  . Lupus    . Coronary artery disease    . Heart disease Mother   . Diabetes Father    History  Substance Use Topics  . Smoking status: Never Smoker   . Smokeless tobacco: Never Used  . Alcohol Use: 1.2 oz/week    2 Glasses of wine per week   OB History    No data available     Review of Systems  ROS reviewed and all otherwise negative except for mentioned in HPI    Allergies  Amlodipine besylate; Chlorthalidone; Olmesartan;  Alendronate sodium; and Lisinopril  Home Medications   Prior to Admission medications   Medication Sig Start Date End Date Taking? Authorizing Provider  aspirin EC 81 MG EC tablet Take 81 mg by mouth daily.      Historical Provider, MD  atorvastatin (LIPITOR) 20 MG tablet Take 1 tablet (20 mg total) by mouth daily. 07/25/14   Janith Lima, MD  carvedilol (COREG) 12.5 MG tablet Take 12.5 mg by mouth 2 (two) times daily with a meal.    Historical Provider, MD  Cholecalciferol 1000 UNIT tablet Take 1,000 Units by mouth daily.      Historical Provider, MD  cloNIDine (CATAPRES) 0.1 MG tablet Take 1 tablet (0.1 mg total) by mouth 3 (three) times daily. 11/27/14   Janith Lima, MD  furosemide (LASIX) 20 MG tablet Take 1 tablet (20 mg total) by mouth 2 (two) times daily. 07/25/14   Janith Lima, MD  Glucosamine 500 MG TABS Take 500 mg by mouth daily.     Historical Provider, MD  metFORMIN (GLUCOPHAGE) 500 MG tablet Take 1 tablet (500 mg total) by mouth daily with breakfast. 05/01/14   Janith Lima, MD  potassium chloride SA (K-DUR,KLOR-CON) 20 MEQ tablet Take 20 mEq by mouth 3 (three) times daily.    Historical Provider, MD   BP 135/84 mmHg  Pulse 55  Temp(Src) 98.1 F (36.7 C) (Oral)  Resp 16  SpO2 99%  Vitals reviewed Physical Exam  Physical Examination: General appearance - alert, well appearing, and in no distress Mental status - alert, oriented to person, place, and time Eyes - no conjunctival injection no scleral icterus Mouth - mucous membranes moist, pharynx normal without lesions Chest - clear to auscultation, no wheezes, rales or rhonchi, symmetric air entry Heart - normal rate, regular rhythm, normal S1, S2, no murmurs, rubs, clicks or gallops Abdomen - soft, nontender, nondistended, no masses or organomegaly Neurological - alert, oriented x 3, cranial nerves 2-12 tested and intact, strength 5/5 in extremities x 4, sensation intact Extremities - peripheral pulses normal, no  pedal edema, no clubbing or cyanosis Skin - normal coloration and turgor, no rashes  ED Course  Procedures (including critical care time) Labs Review Labs Reviewed  URINALYSIS, ROUTINE W REFLEX MICROSCOPIC (NOT AT [facility]) - Abnormal; Notable for the following:    APPearance HAZY (*)    Leukocytes, UA MODERATE (*)    All other components within normal limits  CBC - Abnormal; Notable for the following:    Hemoglobin 11.6 (*)    HCT 35.7 (*)    All other components within normal limits  BASIC METABOLIC PANEL - Abnormal; Notable for the following:    Glucose, Bld 109 (*)    GFR calc non Af Amer 54 (*)    All other components within normal limits  URINE MICROSCOPIC-ADD ON - Abnormal; Notable for the following:    Squamous Epithelial / LPF FEW (*)    Bacteria, UA FEW (*)    All other components within normal limits  URINE CULTURE    Imaging Review No results found.   EKG Interpretation   Date/Time:  Thursday November 29 2014 07:21:27 EDT Ventricular Rate:  65 PR Interval:  176 QRS Duration: 79 QT Interval:  398 QTC Calculation: 414 R Axis:   -14 Text Interpretation:  Sinus rhythm Low voltage, precordial leads Abnormal  R-wave progression, early transition Baseline wander in lead(s) V1 No old  tracing to compare Confirmed by [facility]  MD, [employer] [id number]) on 11/29/2014  8:07:01 AM      MDM   Final diagnoses:  Essential hypertension    Pt presenting with c/o elevated blood pressure this morning.  Pt has no evidence of end organ damage.  Pt to be discharged to f/u with PMD.  Discharged with strict return precautions.  Pt agreeable with plan.    Alfonzo Beers, MD 11/30/14 1350

## 2014-11-29 NOTE — ED Notes (Signed)
Pt reports that she woke up diaphoretic and took her BP which was very elevated for her this morning. Pt denies SOB and all pain. Pt alert x4.  Pt reports that she has been having increased stress due to the loss of her son.

## 2014-11-29 NOTE — Discharge Instructions (Signed)
Return to the ED with any concerns including chest pain, difficulty breathing, changes in vision or speech, weakness of arms or legs, fainting, decreased level of alertness/lethargy, or any other alarming symptoms

## 2014-11-30 LAB — URINE CULTURE
COLONY COUNT: NO GROWTH
Culture: NO GROWTH

## 2014-12-20 ENCOUNTER — Other Ambulatory Visit: Payer: Self-pay

## 2014-12-20 MED ORDER — POTASSIUM CHLORIDE CRYS ER 20 MEQ PO TBCR: 20.0000 meq | EXTENDED_RELEASE_TABLET | Freq: Three times a day (TID) | ORAL | Status: DC

## 2014-12-24 ENCOUNTER — Other Ambulatory Visit: Payer: Self-pay | Admitting: *Deleted

## 2014-12-24 MED ORDER — POTASSIUM CHLORIDE CRYS ER 20 MEQ PO TBCR: 20.0000 meq | EXTENDED_RELEASE_TABLET | Freq: Three times a day (TID) | ORAL | Status: DC

## 2015-01-08 ENCOUNTER — Encounter: Payer: Self-pay | Admitting: Internal Medicine

## 2015-01-08 ENCOUNTER — Ambulatory Visit (INDEPENDENT_AMBULATORY_CARE_PROVIDER_SITE_OTHER): Payer: Commercial Managed Care - HMO | Admitting: Internal Medicine

## 2015-01-08 VITALS — BP 152/96 | HR 70 | Temp 97.9°F | Resp 16 | Ht 59.0 in | Wt 180.0 lb

## 2015-01-08 DIAGNOSIS — I10 Essential (primary) hypertension: Secondary | ICD-10-CM

## 2015-01-08 MED ORDER — CLONIDINE HCL 0.1 MG PO TABS: 0.1000 mg | ORAL_TABLET | Freq: Three times a day (TID) | ORAL | Status: DC

## 2015-01-08 NOTE — Progress Notes (Signed)
Pre visit review using our clinic review tool, if applicable. No additional management support is needed unless otherwise documented below in the visit note. 

## 2015-01-08 NOTE — Patient Instructions (Signed)

## 2015-01-08 NOTE — Progress Notes (Signed)
Subjective:  Patient ID: Brittany [name], female    DOB: [birth year]  Age: 81 y.o. MRN: [id number]  CC: Hypertension   HPI Brittany [name] presents for a blood pressure check. She feels well and offers no complaints. She tells me at home her blood pressures been well controlled. She is concerned because sometimes her systolic blood pressure drops down to 115.  Outpatient Prescriptions Prior to Visit  Medication Sig Dispense Refill  . aspirin EC 81 MG EC tablet Take 81 mg by mouth daily.      Marland Kitchen atorvastatin (LIPITOR) 20 MG tablet Take 1 tablet (20 mg total) by mouth daily. 90 tablet 3  . carvedilol (COREG) 12.5 MG tablet Take 12.5 mg by mouth 2 (two) times daily with a meal.    . Cholecalciferol 1000 UNIT tablet Take 1,000 Units by mouth daily.      . furosemide (LASIX) 20 MG tablet Take 1 tablet (20 mg total) by mouth 2 (two) times daily. 180 tablet 1  . Glucosamine 500 MG TABS Take 500 mg by mouth daily.     . metFORMIN (GLUCOPHAGE) 500 MG tablet Take 1 tablet (500 mg total) by mouth daily with breakfast. 90 tablet 3  . potassium chloride SA (K-DUR,KLOR-CON) 20 MEQ tablet Take 1 tablet (20 mEq total) by mouth 3 (three) times daily. 270 tablet 1  . cloNIDine (CATAPRES) 0.1 MG tablet Take 1 tablet (0.1 mg total) by mouth 3 (three) times daily. 90 tablet 11   No facility-administered medications prior to visit.    ROS Review of Systems  Constitutional: Negative.  Negative for fever, chills, diaphoresis, appetite change and fatigue.  HENT: Negative.   Eyes: Negative.   Respiratory: Negative.  Negative for cough, choking, chest tightness, shortness of breath and stridor.   Cardiovascular: Negative.  Negative for chest pain, palpitations and leg swelling.  Gastrointestinal: Negative.  Negative for nausea, vomiting, abdominal pain, diarrhea, constipation and blood in stool.  Endocrine: Negative.   Genitourinary: Negative.  Negative for urgency, hematuria, flank pain and difficulty  urinating.  Musculoskeletal: Negative.   Skin: Negative.   Allergic/Immunologic: Negative.   Neurological: Negative.  Negative for dizziness, tremors, speech difficulty, weakness, light-headedness, numbness and headaches.  Hematological: Negative.  Negative for adenopathy. Does not bruise/bleed easily.  Psychiatric/Behavioral: Negative.     Objective:  BP 152/96 mmHg  Pulse 70  Temp(Src) 97.9 F (36.6 C) (Oral)  Resp 16  Ht 4\' 11"  (1.499 m)  Wt 180 lb (81.647 kg)  BMI 36.34 kg/m2  SpO2 97%  BP Readings from Last 3 Encounters:  01/08/15 152/96  11/29/14 135/84  11/27/14 150/102    Wt Readings from Last 3 Encounters:  01/08/15 180 lb (81.647 kg)  11/27/14 185 lb 12 oz (84.256 kg)  07/25/14 182 lb (82.555 kg)    Physical Exam  Constitutional: She is oriented to person, place, and time. No distress.  HENT:  Mouth/Throat: Oropharynx is clear and moist. No oropharyngeal exudate.  Eyes: Conjunctivae are normal. Right eye exhibits no discharge. Left eye exhibits no discharge. No scleral icterus.  Neck: Normal range of motion. Neck supple. No JVD present. No tracheal deviation present. No thyromegaly present.  Cardiovascular: Normal rate, regular rhythm, normal heart sounds and intact distal pulses.  Exam reveals no gallop and no friction rub.   No murmur heard. Pulmonary/Chest: Effort normal and breath sounds normal. No stridor. No respiratory distress. She has no wheezes. She has no rales. She exhibits no tenderness.  Abdominal: Soft. Bowel  sounds are normal. She exhibits no distension and no mass. There is no tenderness. There is no rebound and no guarding.  Musculoskeletal: Normal range of motion. She exhibits no edema or tenderness.  Lymphadenopathy:    She has no cervical adenopathy.  Neurological: She is oriented to person, place, and time.  Skin: Skin is warm and dry. No rash noted. She is not diaphoretic. No erythema. No pallor.  Psychiatric: She has a normal mood and  affect. Her behavior is normal. Judgment and thought content normal.  Vitals reviewed.   Lab Results  Component Value Date   WBC 4.7 11/29/2014   HGB 11.6* 11/29/2014   HCT 35.7* 11/29/2014   PLT 398 11/29/2014   GLUCOSE 109* 11/29/2014   CHOL 204* 07/25/2014   TRIG 161.0* 07/25/2014   HDL 74.00 07/25/2014   LDLDIRECT 119.8 04/02/2008   LDLCALC 98 07/25/2014   ALT 15 10/04/2013   AST 18 10/04/2013   NA 139 11/29/2014   K 3.6 11/29/2014   CL 106 11/29/2014   CREATININE 0.94 11/29/2014   BUN 11 11/29/2014   CO2 22 11/29/2014   TSH 3.23 07/25/2014   HGBA1C 6.2 11/27/2014   MICROALBUR 0.5 07/25/2014    No results found.  Assessment & Plan:   Brittany [name] was seen today for hypertension.  Diagnoses and all orders for this visit:  Essential hypertension, benign - overall her blood pressures are adequately well controlled. She does not wish to make any additions to her medical regimen at this time. Orders: -     cloNIDine (CATAPRES) 0.1 MG tablet; Take 1 tablet (0.1 mg total) by mouth 3 (three) times daily.  I am having Brittany [name] maintain her aspirin EC, Cholecalciferol, Glucosamine, carvedilol, metFORMIN, furosemide, atorvastatin, potassium chloride SA, and cloNIDine.  Meds ordered this encounter  Medications  . cloNIDine (CATAPRES) 0.1 MG tablet    Sig: Take 1 tablet (0.1 mg total) by mouth 3 (three) times daily.    Dispense:  90 tablet    Refill:  11     Follow-up: Return in about 3 months (around 04/10/2015).  Scarlette Calico, MD

## 2015-03-06 ENCOUNTER — Ambulatory Visit (INDEPENDENT_AMBULATORY_CARE_PROVIDER_SITE_OTHER): Payer: Commercial Managed Care - HMO | Admitting: Internal Medicine

## 2015-03-06 ENCOUNTER — Encounter: Payer: Self-pay | Admitting: Internal Medicine

## 2015-03-06 VITALS — BP 162/88 | HR 74 | Temp 97.9°F | Resp 16 | Ht 59.0 in | Wt 179.0 lb

## 2015-03-06 DIAGNOSIS — I10 Essential (primary) hypertension: Secondary | ICD-10-CM

## 2015-03-06 MED ORDER — CARVEDILOL 25 MG PO TABS: 25.0000 mg | ORAL_TABLET | Freq: Two times a day (BID) | ORAL | Status: DC

## 2015-03-06 NOTE — Patient Instructions (Signed)

## 2015-03-06 NOTE — Progress Notes (Signed)
Subjective:  Patient ID: Brittany [name], female    DOB: 1929-02-11  Age: 81 y.o. MRN: [id number]  CC: Hypertension   HPI Brittany [name] presents for a blood pressure check. She is concerned because for the last few weeks her blood pressure has been as high as 180/108 and at other  Times about 150/82. She states that she does not feel like her blood pressure is well-controlled. She has occasionally had a headache but offers no other complaints.  Outpatient Prescriptions Prior to Visit  Medication Sig Dispense Refill  . aspirin EC 81 MG EC tablet Take 81 mg by mouth daily.      Marland Kitchen atorvastatin (LIPITOR) 20 MG tablet Take 1 tablet (20 mg total) by mouth daily. 90 tablet 3  . Cholecalciferol 1000 UNIT tablet Take 1,000 Units by mouth daily.      . cloNIDine (CATAPRES) 0.1 MG tablet Take 1 tablet (0.1 mg total) by mouth 3 (three) times daily. 90 tablet 11  . furosemide (LASIX) 20 MG tablet Take 1 tablet (20 mg total) by mouth 2 (two) times daily. 180 tablet 1  . Glucosamine 500 MG TABS Take 500 mg by mouth daily.     . metFORMIN (GLUCOPHAGE) 500 MG tablet Take 1 tablet (500 mg total) by mouth daily with breakfast. 90 tablet 3  . potassium chloride SA (K-DUR,KLOR-CON) 20 MEQ tablet Take 1 tablet (20 mEq total) by mouth 3 (three) times daily. 270 tablet 1  . carvedilol (COREG) 12.5 MG tablet Take 12.5 mg by mouth 2 (two) times daily with a meal.     No facility-administered medications prior to visit.    ROS Review of Systems  Constitutional: Negative.  Negative for fever, chills, diaphoresis, appetite change and fatigue.  HENT: Negative.   Eyes: Negative.   Respiratory: Negative.  Negative for cough, choking, chest tightness, shortness of breath and stridor.   Cardiovascular: Negative.  Negative for chest pain, palpitations and leg swelling.  Gastrointestinal: Negative.  Negative for nausea, vomiting, abdominal pain, diarrhea, constipation and blood in stool.  Endocrine: Negative.     Genitourinary: Negative.  Negative for dysuria, hematuria and difficulty urinating.  Musculoskeletal: Negative.   Skin: Negative.   Allergic/Immunologic: Negative.   Neurological: Negative.  Negative for dizziness, syncope, speech difficulty, light-headedness and headaches.  Hematological: Negative.  Negative for adenopathy. Does not bruise/bleed easily.  Psychiatric/Behavioral: Positive for sleep disturbance. Negative for suicidal ideas, dysphoric mood and agitation. The patient is nervous/anxious.     Objective:  Pulse 74  Temp(Src) 97.9 F (36.6 C) (Oral)  Ht 4\' 11"  (1.499 m)  Wt 179 lb (81.194 kg)  BMI 36.13 kg/m2  SpO2 97%  BP Readings from Last 3 Encounters:  01/08/15 152/96  11/29/14 135/84  11/27/14 150/102    Wt Readings from Last 3 Encounters:  03/06/15 179 lb (81.194 kg)  01/08/15 180 lb (81.647 kg)  11/27/14 185 lb 12 oz (84.256 kg)    Physical Exam  Constitutional: She is oriented to person, place, and time. She appears well-developed and well-nourished. No distress.  HENT:  Head: Normocephalic and atraumatic.  Mouth/Throat: Oropharynx is clear and moist. No oropharyngeal exudate.  Eyes: Conjunctivae are normal. Right eye exhibits no discharge. Left eye exhibits no discharge. No scleral icterus.  Neck: Normal range of motion. Neck supple. No JVD present. No tracheal deviation present. No thyromegaly present.  Cardiovascular: Normal rate, regular rhythm, normal heart sounds and intact distal pulses.  Exam reveals no gallop and no friction rub.  No murmur heard. Pulmonary/Chest: Effort normal and breath sounds normal. No stridor. No respiratory distress. She has no wheezes. She has no rales. She exhibits no tenderness.  Abdominal: Soft. Bowel sounds are normal. She exhibits no distension and no mass. There is no tenderness. There is no rebound and no guarding.  Musculoskeletal: Normal range of motion. She exhibits no edema or tenderness.  Lymphadenopathy:     She has no cervical adenopathy.  Neurological: She is oriented to person, place, and time.  Skin: Skin is warm and dry. No rash noted. She is not diaphoretic. No erythema. No pallor.  Vitals reviewed.   Lab Results  Component Value Date   WBC 4.7 11/29/2014   HGB 11.6* 11/29/2014   HCT 35.7* 11/29/2014   PLT 398 11/29/2014   GLUCOSE 109* 11/29/2014   CHOL 204* 07/25/2014   TRIG 161.0* 07/25/2014   HDL 74.00 07/25/2014   LDLDIRECT 119.8 04/02/2008   LDLCALC 98 07/25/2014   ALT 15 10/04/2013   AST 18 10/04/2013   NA 139 11/29/2014   K 3.6 11/29/2014   CL 106 11/29/2014   CREATININE 0.94 11/29/2014   BUN 11 11/29/2014   CO2 22 11/29/2014   TSH 3.23 07/25/2014   HGBA1C 6.2 11/27/2014   MICROALBUR 0.5 07/25/2014    No results found.  Assessment & Plan:   Brittany [name] was seen today for hypertension.  Diagnoses and all orders for this visit:  Essential hypertension, benign- her blood pressure is not well controlled, I will increase the dose of carvedilol. She will continue the other medications without changes. I have encouraged her to stay physically active and to try some stress reduction techniques like meditation and walking. -     carvedilol (COREG) 25 MG tablet; Take 1 tablet (25 mg total) by mouth 2 (two) times daily with a meal.   I have discontinued Ms. Brittany [name]'s carvedilol. I am also having her start on carvedilol. Additionally, I am having her maintain her aspirin EC, Cholecalciferol, Glucosamine, metFORMIN, furosemide, atorvastatin, potassium chloride SA, and cloNIDine.  Meds ordered this encounter  Medications  . carvedilol (COREG) 25 MG tablet    Sig: Take 1 tablet (25 mg total) by mouth 2 (two) times daily with a meal.    Dispense:  60 tablet    Refill:  1     Follow-up: Return in about 6 weeks (around 04/17/2015).  Brittany Calico, MD

## 2015-03-06 NOTE — Progress Notes (Signed)
Pre visit review using our clinic review tool, if applicable. No additional management support is needed unless otherwise documented below in the visit note. 

## 2015-03-25 ENCOUNTER — Other Ambulatory Visit: Payer: Self-pay

## 2015-03-25 DIAGNOSIS — I10 Essential (primary) hypertension: Secondary | ICD-10-CM

## 2015-03-25 MED ORDER — FUROSEMIDE 20 MG PO TABS: 20.0000 mg | ORAL_TABLET | Freq: Two times a day (BID) | ORAL | Status: DC

## 2015-04-26 ENCOUNTER — Other Ambulatory Visit: Payer: Self-pay

## 2015-04-26 MED ORDER — METFORMIN HCL 500 MG PO TABS: 500.0000 mg | ORAL_TABLET | Freq: Every day | ORAL | Status: DC

## 2015-05-06 ENCOUNTER — Other Ambulatory Visit: Payer: Self-pay

## 2015-05-06 DIAGNOSIS — I10 Essential (primary) hypertension: Secondary | ICD-10-CM

## 2015-05-06 MED ORDER — CARVEDILOL 25 MG PO TABS: 25.0000 mg | ORAL_TABLET | Freq: Two times a day (BID) | ORAL | Status: DC

## 2015-05-15 ENCOUNTER — Encounter: Payer: Self-pay | Admitting: Internal Medicine

## 2015-05-15 ENCOUNTER — Other Ambulatory Visit (INDEPENDENT_AMBULATORY_CARE_PROVIDER_SITE_OTHER): Payer: Commercial Managed Care - HMO

## 2015-05-15 ENCOUNTER — Ambulatory Visit (INDEPENDENT_AMBULATORY_CARE_PROVIDER_SITE_OTHER): Payer: Commercial Managed Care - HMO | Admitting: Internal Medicine

## 2015-05-15 VITALS — BP 130/88 | HR 83 | Temp 98.4°F | Resp 16 | Ht 59.0 in | Wt 182.0 lb

## 2015-05-15 DIAGNOSIS — E118 Type 2 diabetes mellitus with unspecified complications: Secondary | ICD-10-CM

## 2015-05-15 DIAGNOSIS — E785 Hyperlipidemia, unspecified: Secondary | ICD-10-CM | POA: Diagnosis not present

## 2015-05-15 DIAGNOSIS — R072 Precordial pain: Secondary | ICD-10-CM | POA: Diagnosis not present

## 2015-05-15 DIAGNOSIS — R791 Abnormal coagulation profile: Secondary | ICD-10-CM

## 2015-05-15 DIAGNOSIS — R7989 Other specified abnormal findings of blood chemistry: Secondary | ICD-10-CM

## 2015-05-15 LAB — BASIC METABOLIC PANEL
BUN: 11 mg/dL (ref 6–23)
CHLORIDE: 108 meq/L (ref 96–112)
CO2: 25 meq/L (ref 19–32)
Calcium: 9.7 mg/dL (ref 8.4–10.5)
Creatinine, Ser: 0.86 mg/dL (ref 0.40–1.20)
GFR: 80.74 mL/min (ref >60.00)
GLUCOSE: 116 mg/dL — AB (ref 70–99)
POTASSIUM: 4.1 meq/L (ref 3.5–5.1)
SODIUM: 141 meq/L (ref 135–145)

## 2015-05-15 LAB — LIPID PANEL
CHOLESTEROL: 164 mg/dL (ref 0–200)
HDL: 73.3 mg/dL (ref >39.00)
LDL CALC: 77 mg/dL (ref 0–99)
NonHDL: 91.09
TRIGLYCERIDES: 69 mg/dL (ref 0.0–149.0)
Total CHOL/HDL Ratio: 2
VLDL: 13.8 mg/dL (ref 0.0–40.0)

## 2015-05-15 LAB — TROPONIN I: TNIDX: 0.01 ug/L (ref 0.00–0.06)

## 2015-05-15 LAB — HEMOGLOBIN A1C: Hgb A1c MFr Bld: 6.4 % (ref 4.6–6.5)

## 2015-05-15 NOTE — Patient Instructions (Signed)

## 2015-05-15 NOTE — Progress Notes (Signed)
Pre visit review using our clinic review tool, if applicable. No additional management support is needed unless otherwise documented below in the visit note. 

## 2015-05-15 NOTE — Progress Notes (Signed)
Subjective:  Patient ID: Brittany [name], female    DOB: 08/21/1928  Age: 81 y.o. MRN: [id number]  CC: Chest Pain   HPI AVALINA LIECHTY presents for complaints of medication side effects. She complains that carvedilol causes palpitations and left-sided chest pain. She describes the discomfort in her left upper anterior chest wall that occurs at rest over the last few weeks. The chest pain is not pleuritic, it is not exertional, and it doesn't change with position or movement. The palpitations are not associated with shortness of breath, dizziness, lightheadedness, or near syncope. She also complains that Lasix makes her legs feel weak. She wants to stop taking both of these medications.  Outpatient Prescriptions Prior to Visit  Medication Sig Dispense Refill  . aspirin EC 81 MG EC tablet Take 81 mg by mouth daily.      Marland Kitchen atorvastatin (LIPITOR) 20 MG tablet Take 1 tablet (20 mg total) by mouth daily. 90 tablet 3  . Cholecalciferol 1000 UNIT tablet Take 1,000 Units by mouth daily.      . cloNIDine (CATAPRES) 0.1 MG tablet Take 1 tablet (0.1 mg total) by mouth 3 (three) times daily. 90 tablet 11  . Glucosamine 500 MG TABS Take 500 mg by mouth daily.     . metFORMIN (GLUCOPHAGE) 500 MG tablet Take 1 tablet (500 mg total) by mouth daily with breakfast. 90 tablet 3  . potassium chloride SA (K-DUR,KLOR-CON) 20 MEQ tablet Take 1 tablet (20 mEq total) by mouth 3 (three) times daily. 270 tablet 1  . carvedilol (COREG) 25 MG tablet Take 1 tablet (25 mg total) by mouth 2 (two) times daily with a meal. 180 tablet 1  . furosemide (LASIX) 20 MG tablet Take 1 tablet (20 mg total) by mouth 2 (two) times daily. 180 tablet 1   No facility-administered medications prior to visit.    ROS Review of Systems  Constitutional: Negative for fever, chills, diaphoresis, appetite change and fatigue.  HENT: Negative.   Eyes: Negative.   Respiratory: Negative.  Negative for cough, choking, chest tightness, shortness of  breath and stridor.   Cardiovascular: Positive for chest pain. Negative for palpitations and leg swelling.  Gastrointestinal: Negative.  Negative for nausea, vomiting, abdominal pain, diarrhea, constipation and blood in stool.  Endocrine: Negative.   Genitourinary: Negative.   Musculoskeletal: Negative.   Skin: Negative.   Allergic/Immunologic: Negative.   Neurological: Positive for weakness. Negative for dizziness, light-headedness and headaches.  Hematological: Negative.  Negative for adenopathy. Does not bruise/bleed easily.  Psychiatric/Behavioral: Negative.     Objective:  BP 130/88 mmHg  Pulse 83  Temp(Src) 98.4 F (36.9 C) (Oral)  Resp 16  Ht 4\' 11"  (1.499 m)  Wt 182 lb (82.555 kg)  BMI 36.74 kg/m2  SpO2 96%  BP Readings from Last 3 Encounters:  05/15/15 130/88  03/06/15 162/88  01/08/15 152/96    Wt Readings from Last 3 Encounters:  05/15/15 182 lb (82.555 kg)  03/06/15 179 lb (81.194 kg)  01/08/15 180 lb (81.647 kg)    Physical Exam  Constitutional: She is oriented to person, place, and time. No distress.  HENT:  Mouth/Throat: Oropharynx is clear and moist. No oropharyngeal exudate.  Eyes: Conjunctivae are normal. Right eye exhibits no discharge. Left eye exhibits no discharge. No scleral icterus.  Neck: Normal range of motion. Neck supple. No JVD present. No tracheal deviation present. No thyromegaly present.  Cardiovascular: Normal rate, regular rhythm, normal heart sounds and intact distal pulses.  Exam reveals  no gallop and no friction rub.   No murmur heard. EKG - NSR, no ST/T wave changes, no Q waves, normal   Pulmonary/Chest: Effort normal and breath sounds normal. No accessory muscle usage or stridor. No respiratory distress. She has no decreased breath sounds. She has no wheezes. She has no rhonchi. She has no rales. She exhibits no mass, no tenderness, no bony tenderness, no edema and no deformity.  Abdominal: Soft. Bowel sounds are normal. She  exhibits no distension and no mass. There is no tenderness. There is no rebound and no guarding.  Musculoskeletal: Normal range of motion. She exhibits no edema or tenderness.  Lymphadenopathy:    She has no cervical adenopathy.  Neurological: She is oriented to person, place, and time.  Skin: Skin is warm and dry. No rash noted. She is not diaphoretic. No erythema. No pallor.  Vitals reviewed.   Lab Results  Component Value Date   WBC 4.7 11/29/2014   HGB 11.6* 11/29/2014   HCT 35.7* 11/29/2014   PLT 398 11/29/2014   GLUCOSE 116* 05/15/2015   CHOL 164 05/15/2015   TRIG 69.0 05/15/2015   HDL 73.30 05/15/2015   LDLDIRECT 119.8 04/02/2008   LDLCALC 77 05/15/2015   ALT 15 10/04/2013   AST 18 10/04/2013   NA 141 05/15/2015   K 4.1 05/15/2015   CL 108 05/15/2015   CREATININE 0.86 05/15/2015   BUN 11 05/15/2015   CO2 25 05/15/2015   TSH 3.23 07/25/2014   HGBA1C 6.4 05/15/2015   MICROALBUR 0.5 07/25/2014    No results found.  Assessment & Plan:   Jaely was seen today for chest pain.  Diagnoses and all orders for this visit:  Precordial chest pain- her EKG is normal, her chest pain does not sound like angina. She has a slight increase in her CK-MB but her troponin is negative. I don't think her pain is related to cardiac ischemia, will follow for now. She has an elevated d-dimer so will get a CT angiogram to screen for pulmonary embolus and other structural lesions in the chest that could cause pain. -     Troponin I; Future -     Cardiac panel; Future -     D-dimer, quantitative (not at [facility]); Future -     EKG 12-Lead -     CT Angio Chest PE W/Cm &/Or Wo Cm; Future  Type 2 diabetes mellitus with complication, without long-term current use of insulin (HCC)-her blood sugars are well-controlled -     Lipid panel; Future -     Basic metabolic panel; Future -     Hemoglobin A1c; Future  Hyperlipidemia with target LDL less than 100- she has achieved her LDL goal. -     Lipid  panel; Future  D-dimer, elevated- she has a prior history of cancer and is at relatively high risk for pulmonary embolus. I have ordered a CT angiomata to screen for PE. -     CT Angio Chest PE W/Cm &/Or Wo Cm; Future   I have discontinued Ms. Neiss's furosemide and carvedilol. I am also having her maintain her aspirin EC, Cholecalciferol, Glucosamine, atorvastatin, potassium chloride SA, cloNIDine, and metFORMIN.  No orders of the defined types were placed in this encounter.     Follow-up: Return in about 3 weeks (around 06/05/2015).  Scarlette Calico, MD

## 2015-05-16 ENCOUNTER — Telehealth: Payer: Self-pay | Admitting: *Deleted

## 2015-05-16 ENCOUNTER — Ambulatory Visit (INDEPENDENT_AMBULATORY_CARE_PROVIDER_SITE_OTHER)
Admission: RE | Admit: 2015-05-16 | Discharge: 2015-05-16 | Disposition: A | Discharge status: Home / Self Care | Payer: Commercial Managed Care - HMO | Source: Ambulatory Visit | Attending: Internal Medicine | Admitting: Internal Medicine

## 2015-05-16 DIAGNOSIS — R072 Precordial pain: Secondary | ICD-10-CM

## 2015-05-16 DIAGNOSIS — R7989 Other specified abnormal findings of blood chemistry: Secondary | ICD-10-CM

## 2015-05-16 DIAGNOSIS — R791 Abnormal coagulation profile: Secondary | ICD-10-CM

## 2015-05-16 LAB — CARDIAC PANEL
CK MB: 6.1 ng/mL — AB (ref 0.3–4.0)
RELATIVE INDEX: 2.7 calc — AB (ref 0.0–2.5)
Total CK: 224 U/L — ABNORMAL HIGH (ref 7–177)

## 2015-05-16 LAB — D-DIMER, QUANTITATIVE (NOT AT ARMC): D DIMER QUANT: 2.08 ug{FEU}/mL — AB (ref 0.00–0.48)

## 2015-05-16 MED ORDER — IOHEXOL 350 MG/ML SOLN
80.0000 mL | Freq: Once | INTRAVENOUS | Status: AC | PRN
  Administered 2015-05-16: 80 mL via INTRAVENOUS

## 2015-05-16 NOTE — Telephone Encounter (Signed)
Caller states pt's D-Dimer is critical at 2.08. PCP already aware and has called pt to schedule CT Angio Chest.

## 2015-05-16 NOTE — Telephone Encounter (Signed)
CT called and the results were negative.  Sending to PCP for review.

## 2015-07-16 ENCOUNTER — Encounter: Payer: Self-pay | Admitting: Internal Medicine

## 2015-07-16 ENCOUNTER — Ambulatory Visit (INDEPENDENT_AMBULATORY_CARE_PROVIDER_SITE_OTHER): Payer: Commercial Managed Care - HMO | Admitting: Internal Medicine

## 2015-07-16 VITALS — BP 162/92 | HR 85 | Temp 98.2°F | Resp 16 | Ht 59.0 in | Wt 177.0 lb

## 2015-07-16 DIAGNOSIS — I10 Essential (primary) hypertension: Secondary | ICD-10-CM | POA: Diagnosis not present

## 2015-07-16 MED ORDER — HYDROCHLOROTHIAZIDE 12.5 MG PO CAPS: 12.5000 mg | ORAL_CAPSULE | Freq: Every day | ORAL | Status: DC

## 2015-07-16 MED ORDER — NEBIVOLOL HCL 10 MG PO TABS: 10.0000 mg | ORAL_TABLET | Freq: Every day | ORAL | Status: DC

## 2015-07-16 NOTE — Progress Notes (Signed)
Subjective:  Patient ID: Brittany [name], female    DOB: [birth year]  Age: 82 y.o. MRN: [id number]  CC: Hypertension   HPI Brittany [name] presents for a follow-up on high blood pressure. She complains that late in the day her blood pressure spikes up with a systolic that approaches [id number]. She also occasionally has headaches. She denies any episodes of shortness of breath or chest pain.  Outpatient Prescriptions Prior to Visit  Medication Sig Dispense Refill  . aspirin EC 81 MG EC tablet Take 81 mg by mouth daily.      Marland Kitchen atorvastatin (LIPITOR) 20 MG tablet Take 1 tablet (20 mg total) by mouth daily. 90 tablet 3  . Cholecalciferol 1000 UNIT tablet Take 1,000 Units by mouth daily.      . cloNIDine (CATAPRES) 0.1 MG tablet Take 1 tablet (0.1 mg total) by mouth 3 (three) times daily. 90 tablet 11  . Glucosamine 500 MG TABS Take 500 mg by mouth daily.     . metFORMIN (GLUCOPHAGE) 500 MG tablet Take 1 tablet (500 mg total) by mouth daily with breakfast. 90 tablet 3  . potassium chloride SA (K-DUR,KLOR-CON) 20 MEQ tablet Take 1 tablet (20 mEq total) by mouth 3 (three) times daily. 270 tablet 1   No facility-administered medications prior to visit.    ROS Review of Systems  Constitutional: Negative.  Negative for fever, chills, diaphoresis, appetite change and fatigue.  Eyes: Negative.  Negative for visual disturbance.  Respiratory: Negative.  Negative for cough, choking, chest tightness, shortness of breath and stridor.   Cardiovascular: Negative.  Negative for chest pain, palpitations and leg swelling.  Gastrointestinal: Negative.  Negative for nausea, vomiting and abdominal pain.  Endocrine: Negative.   Genitourinary: Negative.  Negative for dysuria, hematuria and difficulty urinating.  Musculoskeletal: Negative.  Negative for back pain and neck pain.  Skin: Negative.  Negative for color change.  Neurological: Positive for headaches.  Hematological: Negative.  Negative for adenopathy. Does  not bruise/bleed easily.  Psychiatric/Behavioral: Negative.     Objective:  BP 162/92 mmHg  Pulse 85  Temp(Src) 98.2 F (36.8 C) (Oral)  Resp 16  Ht 4\' 11"  (1.499 m)  Wt 177 lb (80.287 kg)  BMI 35.73 kg/m2  SpO2 96%  BP Readings from Last 3 Encounters:  07/16/15 162/92  05/15/15 130/88  03/06/15 162/88    Wt Readings from Last 3 Encounters:  07/16/15 177 lb (80.287 kg)  05/15/15 182 lb (82.555 kg)  03/06/15 179 lb (81.194 kg)    Physical Exam  Constitutional: She is oriented to person, place, and time. No distress.  HENT:  Mouth/Throat: Oropharynx is clear and moist. No oropharyngeal exudate.  Eyes: Conjunctivae are normal. Right eye exhibits no discharge. Left eye exhibits no discharge. No scleral icterus.  Neck: Normal range of motion. Neck supple. No JVD present. No tracheal deviation present. No thyromegaly present.  Cardiovascular: Normal rate, regular rhythm, normal heart sounds and intact distal pulses.  Exam reveals no gallop and no friction rub.   No murmur heard. Pulmonary/Chest: Effort normal and breath sounds normal. No stridor. No respiratory distress. She has no wheezes. She has no rales. She exhibits no tenderness.  Abdominal: Soft. Bowel sounds are normal. She exhibits no distension and no mass. There is no tenderness. There is no rebound and no guarding.  Musculoskeletal: Normal range of motion. She exhibits no edema or tenderness.  Lymphadenopathy:    She has no cervical adenopathy.  Neurological: She is oriented to person,  place, and time. She has normal reflexes. She displays normal reflexes. No cranial nerve deficit. She exhibits normal muscle tone. Coordination normal.  Skin: Skin is warm and dry. No rash noted. She is not diaphoretic. No erythema. No pallor.  Psychiatric: She has a normal mood and affect. Her behavior is normal. Judgment and thought content normal.  Vitals reviewed.   Lab Results  Component Value Date   WBC 4.7 11/29/2014   HGB  11.6* 11/29/2014   HCT 35.7* 11/29/2014   PLT 398 11/29/2014   GLUCOSE 116* 05/15/2015   CHOL 164 05/15/2015   TRIG 69.0 05/15/2015   HDL 73.30 05/15/2015   LDLDIRECT 119.8 04/02/2008   LDLCALC 77 05/15/2015   ALT 15 10/04/2013   AST 18 10/04/2013   NA 141 05/15/2015   K 4.1 05/15/2015   CL 108 05/15/2015   CREATININE 0.86 05/15/2015   BUN 11 05/15/2015   CO2 25 05/15/2015   TSH 3.23 07/25/2014   HGBA1C 6.4 05/15/2015   MICROALBUR 0.5 07/25/2014    Ct Angio Chest Pe W/cm &/or Wo Cm  05/16/2015  CLINICAL DATA:  Chest pain and short of breath.  Elevated D-dimer EXAM: CT ANGIOGRAPHY CHEST WITH CONTRAST TECHNIQUE: Multidetector CT imaging of the chest was performed using the standard protocol during bolus administration of intravenous contrast. Multiplanar CT image reconstructions and MIPs were obtained to evaluate the vascular anatomy. CONTRAST:  80mL OMNIPAQUE IOHEXOL 350 MG/ML SOLN COMPARISON:  Chest x-ray 04/02/2008 FINDINGS: Negative for pulmonary embolism. Pulmonary arteries normal in caliber. Mild atherosclerotic disease in the aortic arch without aneurysm or dissection. Aberrant right subclavian with retroesophageal course. This is an anatomical variant . Cardiac enlargement without pericardial effusion. No significant coronary calcification. Negative for pneumonia. Mild emphysema in the left lower lobe. Negative for pleural effusion. Negative for mass or adenopathy. Left renal cyst.  Multiple hepatic cysts. Review of the MIP images confirms the above findings. IMPRESSION: Negative for pulmonary embolism.  No acute abnormality in the chest. Electronically Signed   By: Franchot Gallo M.D.   On: 05/16/2015 14:56    Assessment & Plan:   Glatha was seen today for hypertension.  Diagnoses and all orders for this visit:  Essential hypertension, benign- her blood pressure is not well controlled, will change carvedilol to Bystolic for better blood pressure reduction, and will also add a  thiazide diuretic as well. Will continue taking clonidine. -     nebivolol (BYSTOLIC) 10 MG tablet; Take 1 tablet (10 mg total) by mouth daily. -     hydrochlorothiazide (MICROZIDE) 12.5 MG capsule; Take 1 capsule (12.5 mg total) by mouth daily.   I have discontinued Ms. Oberry's carvedilol. I am also having her start on nebivolol and hydrochlorothiazide. Additionally, I am having her maintain her aspirin EC, Cholecalciferol, Glucosamine, atorvastatin, potassium chloride SA, cloNIDine, and metFORMIN.  Meds ordered this encounter  Medications  . DISCONTD: carvedilol (COREG) 25 MG tablet    Sig: Take 25 mg by mouth 2 (two) times daily with a meal.    Refill:  0  . nebivolol (BYSTOLIC) 10 MG tablet    Sig: Take 1 tablet (10 mg total) by mouth daily.    Dispense:  30 tablet    Refill:  11  . hydrochlorothiazide (MICROZIDE) 12.5 MG capsule    Sig: Take 1 capsule (12.5 mg total) by mouth daily.    Dispense:  30 capsule    Refill:  5     Follow-up: Return in about 2 months (around  09/13/2015).  Scarlette Calico, MD

## 2015-07-16 NOTE — Patient Instructions (Signed)
Hypertension Hypertension, commonly called high blood pressure, is when the force of blood pumping through your arteries is too strong. Your arteries are the blood vessels that carry blood from your heart throughout your body. A blood pressure reading consists of a higher number over a lower number, such as 110/72. The higher number (systolic) is the pressure inside your arteries when your heart pumps. The lower number (diastolic) is the pressure inside your arteries when your heart relaxes. Ideally you want your blood pressure below 120/80. Hypertension forces your heart to work harder to pump blood. Your arteries may become narrow or stiff. Having untreated or uncontrolled hypertension can cause heart attack, stroke, kidney disease, and other problems. RISK FACTORS Some risk factors for high blood pressure are controllable. Others are not.  Risk factors you cannot control include:   Race. You may be at higher risk if you are African American.  Age. Risk increases with age.  Gender. Men are at higher risk than women before age 45 years. After age 65, women are at higher risk than men. Risk factors you can control include:  Not getting enough exercise or physical activity.  Being overweight.  Getting too much fat, sugar, calories, or salt in your diet.  Drinking too much alcohol. SIGNS AND SYMPTOMS Hypertension does not usually cause signs or symptoms. Extremely high blood pressure (hypertensive crisis) may cause headache, anxiety, shortness of breath, and nosebleed. DIAGNOSIS To check if you have hypertension, your health care provider will measure your blood pressure while you are seated, with your arm held at the level of your heart. It should be measured at least twice using the same arm. Certain conditions can cause a difference in blood pressure between your right and left arms. A blood pressure reading that is higher than normal on one occasion does not mean that you need treatment. If  it is not clear whether you have high blood pressure, you may be asked to return on a different day to have your blood pressure checked again. Or, you may be asked to monitor your blood pressure at home for 1 or more weeks. TREATMENT Treating high blood pressure includes making lifestyle changes and possibly taking medicine. Living a healthy lifestyle can help lower high blood pressure. You may need to change some of your habits. Lifestyle changes may include:  Following the DASH diet. This diet is high in fruits, vegetables, and whole grains. It is low in salt, red meat, and added sugars.  Keep your sodium intake below 2,300 mg per day.  Getting at least 30-45 minutes of aerobic exercise at least 4 times per week.  Losing weight if necessary.  Not smoking.  Limiting alcoholic beverages.  Learning ways to reduce stress. Your health care provider may prescribe medicine if lifestyle changes are not enough to get your blood pressure under control, and if one of the following is true:  You are 18-59 years of age and your systolic blood pressure is above 140.  You are 60 years of age or older, and your systolic blood pressure is above 150.  Your diastolic blood pressure is above 90.  You have diabetes, and your systolic blood pressure is over 140 or your diastolic blood pressure is over 90.  You have kidney disease and your blood pressure is above 140/90.  You have heart disease and your blood pressure is above 140/90. Your personal target blood pressure may vary depending on your medical conditions, your age, and other factors. HOME CARE INSTRUCTIONS    Have your blood pressure rechecked as directed by your health care provider.   Take medicines only as directed by your health care provider. Follow the directions carefully. Blood pressure medicines must be taken as prescribed. The medicine does not work as well when you skip doses. Skipping doses also puts you at risk for  problems.  Do not smoke.   Monitor your blood pressure at home as directed by your health care provider. SEEK MEDICAL CARE IF:   You think you are having a reaction to medicines taken.  You have recurrent headaches or feel dizzy.  You have swelling in your ankles.  You have trouble with your vision. SEEK IMMEDIATE MEDICAL CARE IF:  You develop a severe headache or confusion.  You have unusual weakness, numbness, or feel faint.  You have severe chest or abdominal pain.  You vomit repeatedly.  You have trouble breathing. MAKE SURE YOU:   Understand these instructions.  Will watch your condition.  Will get help right away if you are not doing well or get worse.   This information is not intended to replace advice given to you by your health care provider. Make sure you discuss any questions you have with your health care provider.   Document Released: 06/15/2005 Document Revised: 10/30/2014 Document Reviewed: 04/07/2013 Elsevier Interactive Patient Education 2016 [employer]  

## 2015-07-16 NOTE — Progress Notes (Signed)
Pre visit review using our clinic review tool, if applicable. No additional management support is needed unless otherwise documented below in the visit note. 

## 2015-08-15 ENCOUNTER — Other Ambulatory Visit: Payer: Self-pay | Admitting: General Practice

## 2015-08-15 DIAGNOSIS — E785 Hyperlipidemia, unspecified: Secondary | ICD-10-CM

## 2015-08-15 MED ORDER — ATORVASTATIN CALCIUM 20 MG PO TABS: 20.0000 mg | ORAL_TABLET | Freq: Every day | ORAL | Status: DC

## 2015-09-23 ENCOUNTER — Encounter: Payer: Self-pay | Admitting: Internal Medicine

## 2015-09-23 ENCOUNTER — Ambulatory Visit (INDEPENDENT_AMBULATORY_CARE_PROVIDER_SITE_OTHER): Payer: Commercial Managed Care - HMO | Admitting: Internal Medicine

## 2015-09-23 ENCOUNTER — Other Ambulatory Visit (INDEPENDENT_AMBULATORY_CARE_PROVIDER_SITE_OTHER): Payer: Commercial Managed Care - HMO

## 2015-09-23 VITALS — BP 170/90 | HR 80 | Temp 98.5°F | Resp 16 | Ht 59.0 in | Wt 183.0 lb

## 2015-09-23 DIAGNOSIS — I10 Essential (primary) hypertension: Secondary | ICD-10-CM

## 2015-09-23 DIAGNOSIS — E118 Type 2 diabetes mellitus with unspecified complications: Secondary | ICD-10-CM

## 2015-09-23 DIAGNOSIS — R140 Abdominal distension (gaseous): Secondary | ICD-10-CM

## 2015-09-23 LAB — MICROALBUMIN / CREATININE URINE RATIO
Creatinine,U: 101.5 mg/dL
Microalb Creat Ratio: 0.7 mg/g (ref 0.0–30.0)
Microalb, Ur: <0.7 mg/dL (ref 0.0–1.9)

## 2015-09-23 LAB — BASIC METABOLIC PANEL
BUN: 12 mg/dL (ref 6–23)
CALCIUM: 9.5 mg/dL (ref 8.4–10.5)
CO2: 27 mEq/L (ref 19–32)
Chloride: 104 mEq/L (ref 96–112)
Creatinine, Ser: 0.81 mg/dL (ref 0.40–1.20)
GFR: 86.44 mL/min (ref >60.00)
GLUCOSE: 114 mg/dL — AB (ref 70–99)
POTASSIUM: 3.8 meq/L (ref 3.5–5.1)
SODIUM: 138 meq/L (ref 135–145)

## 2015-09-23 LAB — URINALYSIS, ROUTINE W REFLEX MICROSCOPIC
BILIRUBIN URINE: NEGATIVE
Hgb urine dipstick: NEGATIVE
KETONES UR: NEGATIVE
NITRITE: NEGATIVE
RBC / HPF: NONE SEEN (ref 0–?)
Specific Gravity, Urine: 1.01 (ref 1.000–1.030)
Total Protein, Urine: NEGATIVE
URINE GLUCOSE: NEGATIVE
Urobilinogen, UA: 0.2 (ref 0.0–1.0)
pH: 6 (ref 5.0–8.0)

## 2015-09-23 LAB — CBC WITH DIFFERENTIAL/PLATELET
BASOS PCT: 0.7 % (ref 0.0–3.0)
Basophils Absolute: 0 10*3/uL (ref 0.0–0.1)
EOS ABS: 0 10*3/uL (ref 0.0–0.7)
EOS PCT: 0.9 % (ref 0.0–5.0)
HEMATOCRIT: 37.2 % (ref 36.0–46.0)
Hemoglobin: 11.9 g/dL — ABNORMAL LOW (ref 12.0–15.0)
LYMPHS PCT: 42.2 % (ref 12.0–46.0)
Lymphs Abs: 1.9 10*3/uL (ref 0.7–4.0)
MCHC: 32.1 g/dL (ref 30.0–36.0)
MCV: 80.9 fl (ref 78.0–100.0)
Monocytes Absolute: 0.4 10*3/uL (ref 0.1–1.0)
Monocytes Relative: 8.5 % (ref 3.0–12.0)
NEUTROS ABS: 2.2 10*3/uL (ref 1.4–7.7)
Neutrophils Relative %: 47.7 % (ref 43.0–77.0)
PLATELETS: 450 10*3/uL — AB (ref 150.0–400.0)
RBC: 4.61 Mil/uL (ref 3.87–5.11)
RDW: 15.4 % (ref 11.5–15.5)
WBC: 4.6 10*3/uL (ref 4.0–10.5)

## 2015-09-23 LAB — POCT URINE PREGNANCY: Preg Test, Ur: NEGATIVE

## 2015-09-23 LAB — HEMOGLOBIN A1C: HEMOGLOBIN A1C: 6.5 % (ref 4.6–6.5)

## 2015-09-23 NOTE — Progress Notes (Signed)
Subjective:  Patient ID: Brittany [name], female    DOB: 04-10-1929  Age: 82 y.o. MRN: [id number]  CC: Hypertension   HPI ELWILLIE FELLA presents for f/up - she had sex about 2 months ago and since then she has gained about 6 pounds and tells me that her abd intermittently feels bloated and she is concerned that she might be pregnant. She denies constipation or abd pain. She has not taken her meds for HTN today because she thought that it may interfere with her lab testing.  Outpatient Prescriptions Prior to Visit  Medication Sig Dispense Refill  . aspirin EC 81 MG EC tablet Take 81 mg by mouth daily.      Marland Kitchen atorvastatin (LIPITOR) 20 MG tablet Take 1 tablet (20 mg total) by mouth daily. 90 tablet 1  . Cholecalciferol 1000 UNIT tablet Take 1,000 Units by mouth daily.      . cloNIDine (CATAPRES) 0.1 MG tablet Take 1 tablet (0.1 mg total) by mouth 3 (three) times daily. 90 tablet 11  . Glucosamine 500 MG TABS Take 500 mg by mouth daily.     . hydrochlorothiazide (MICROZIDE) 12.5 MG capsule Take 1 capsule (12.5 mg total) by mouth daily. 30 capsule 5  . metFORMIN (GLUCOPHAGE) 500 MG tablet Take 1 tablet (500 mg total) by mouth daily with breakfast. 90 tablet 3  . nebivolol (BYSTOLIC) 10 MG tablet Take 1 tablet (10 mg total) by mouth daily. 30 tablet 11  . potassium chloride SA (K-DUR,KLOR-CON) 20 MEQ tablet Take 1 tablet (20 mEq total) by mouth 3 (three) times daily. 270 tablet 1   No facility-administered medications prior to visit.    ROS Review of Systems  Constitutional: Negative.  Negative for fever, chills, diaphoresis, appetite change and fatigue.  HENT: Negative.  Negative for sore throat, trouble swallowing and voice change.   Eyes: Negative.   Respiratory: Negative.  Negative for cough, choking, chest tightness, shortness of breath and stridor.   Cardiovascular: Negative.  Negative for chest pain, palpitations and leg swelling.  Gastrointestinal: Negative.  Negative for nausea,  vomiting, diarrhea, constipation and blood in stool.  Endocrine: Negative.   Genitourinary: Negative.  Negative for difficulty urinating.  Musculoskeletal: Negative.  Negative for myalgias, back pain, joint swelling and arthralgias.  Skin: Negative.  Negative for color change and rash.  Allergic/Immunologic: Negative.   Neurological: Negative.  Negative for dizziness, tremors, syncope, light-headedness and headaches.  Hematological: Negative.  Negative for adenopathy. Does not bruise/bleed easily.  Psychiatric/Behavioral: Negative.     Objective:  BP 170/90 mmHg  Pulse 80  Temp(Src) 98.5 F (36.9 C) (Oral)  Resp 16  Ht 4\' 11"  (1.499 m)  Wt 183 lb (83.008 kg)  BMI 36.94 kg/m2  SpO2 96%  BP Readings from Last 3 Encounters:  09/23/15 170/90  07/16/15 162/92  05/15/15 130/88    Wt Readings from Last 3 Encounters:  09/23/15 183 lb (83.008 kg)  07/16/15 177 lb (80.287 kg)  05/15/15 182 lb (82.555 kg)    Physical Exam  Constitutional: She is oriented to person, place, and time. She appears well-developed and well-nourished.  Non-toxic appearance. She does not have a sickly appearance. She does not appear ill. No distress.  HENT:  Mouth/Throat: Oropharynx is clear and moist. No oropharyngeal exudate.  Eyes: Conjunctivae are normal. Right eye exhibits no discharge. Left eye exhibits no discharge. No scleral icterus.  Neck: Normal range of motion. Neck supple. No JVD present. No tracheal deviation present. No thyromegaly present.  Cardiovascular: Normal rate, regular rhythm, normal heart sounds and intact distal pulses.  Exam reveals no gallop and no friction rub.   No murmur heard. Pulmonary/Chest: Effort normal and breath sounds normal. No stridor. No respiratory distress. She has no wheezes. She has no rales. She exhibits no tenderness.  Abdominal: Soft. Bowel sounds are normal. She exhibits no distension and no mass. There is no tenderness. There is no rebound and no guarding.    Musculoskeletal: Normal range of motion. She exhibits no edema or tenderness.  Lymphadenopathy:    She has no cervical adenopathy.  Neurological: She is oriented to person, place, and time.  Skin: Skin is warm and dry. No rash noted. She is not diaphoretic. No erythema. No pallor.  Vitals reviewed.   Lab Results  Component Value Date   WBC 4.6 09/23/2015   HGB 11.9* 09/23/2015   HCT 37.2 09/23/2015   PLT 450.0* 09/23/2015   GLUCOSE 114* 09/23/2015   CHOL 164 05/15/2015   TRIG 69.0 05/15/2015   HDL 73.30 05/15/2015   LDLDIRECT 119.8 04/02/2008   LDLCALC 77 05/15/2015   ALT 15 10/04/2013   AST 18 10/04/2013   NA 138 09/23/2015   K 3.8 09/23/2015   CL 104 09/23/2015   CREATININE 0.81 09/23/2015   BUN 12 09/23/2015   CO2 27 09/23/2015   TSH 3.23 07/25/2014   HGBA1C 6.5 09/23/2015   MICROALBUR <0.7 09/23/2015    Ct Angio Chest Pe W/cm &/or Wo Cm  05/16/2015  CLINICAL DATA:  Chest pain and short of breath.  Elevated D-dimer EXAM: CT ANGIOGRAPHY CHEST WITH CONTRAST TECHNIQUE: Multidetector CT imaging of the chest was performed using the standard protocol during bolus administration of intravenous contrast. Multiplanar CT image reconstructions and MIPs were obtained to evaluate the vascular anatomy. CONTRAST:  58mL OMNIPAQUE IOHEXOL 350 MG/ML SOLN COMPARISON:  Chest x-ray 04/02/2008 FINDINGS: Negative for pulmonary embolism. Pulmonary arteries normal in caliber. Mild atherosclerotic disease in the aortic arch without aneurysm or dissection. Aberrant right subclavian with retroesophageal course. This is an anatomical variant . Cardiac enlargement without pericardial effusion. No significant coronary calcification. Negative for pneumonia. Mild emphysema in the left lower lobe. Negative for pleural effusion. Negative for mass or adenopathy. Left renal cyst.  Multiple hepatic cysts. Review of the MIP images confirms the above findings. IMPRESSION: Negative for pulmonary embolism.  No acute  abnormality in the chest. Electronically Signed   By: Franchot Gallo M.D.   On: 05/16/2015 14:56    Assessment & Plan:   Dominiqua was seen today for hypertension.  Diagnoses and all orders for this visit:  Essential hypertension, benign- her blood pressure is not well controlled, she has not taken her meds today, she agrees to be more compliant with her medical therapy. Her renal function electrolytes are normal today. -     Basic metabolic panel; Future -     Urinalysis, Routine w reflex microscopic (not at [facility]); Future  Type 2 diabetes mellitus with complication, without long-term current use of insulin ([place])- her blood sugar is adequately well controlled, she will continue to work on her lifestyle modifications. -     Basic metabolic panel; Future -     Microalbumin / creatinine urine ratio; Future -     Hemoglobin A1c; Future  Abdominal bloating- Her urine pregnancy test is negative, she was reassured that she is not pregnant. Her current symptoms are not concerning for organic illness. -     CBC with Differential/Platelet; Future -  POCT urine pregnancy   I am having Ms. Data maintain her aspirin EC, Cholecalciferol, Glucosamine, potassium chloride SA, cloNIDine, metFORMIN, nebivolol, hydrochlorothiazide, and atorvastatin.  No orders of the defined types were placed in this encounter.     Follow-up: Return in about 4 months (around 01/23/2016).  Scarlette Calico, MD

## 2015-09-23 NOTE — Patient Instructions (Signed)
Hypertension Hypertension, commonly called high blood pressure, is when the force of blood pumping through your arteries is too strong. Your arteries are the blood vessels that carry blood from your heart throughout your body. A blood pressure reading consists of a higher number over a lower number, such as 110/72. The higher number (systolic) is the pressure inside your arteries when your heart pumps. The lower number (diastolic) is the pressure inside your arteries when your heart relaxes. Ideally you want your blood pressure below 120/80. Hypertension forces your heart to work harder to pump blood. Your arteries may become narrow or stiff. Having untreated or uncontrolled hypertension can cause heart attack, stroke, kidney disease, and other problems. RISK FACTORS Some risk factors for high blood pressure are controllable. Others are not.  Risk factors you cannot control include:   Race. You may be at higher risk if you are African American.  Age. Risk increases with age.  Gender. Men are at higher risk than women before age 45 years. After age 65, women are at higher risk than men. Risk factors you can control include:  Not getting enough exercise or physical activity.  Being overweight.  Getting too much fat, sugar, calories, or salt in your diet.  Drinking too much alcohol. SIGNS AND SYMPTOMS Hypertension does not usually cause signs or symptoms. Extremely high blood pressure (hypertensive crisis) may cause headache, anxiety, shortness of breath, and nosebleed. DIAGNOSIS To check if you have hypertension, your health care provider will measure your blood pressure while you are seated, with your arm held at the level of your heart. It should be measured at least twice using the same arm. Certain conditions can cause a difference in blood pressure between your right and left arms. A blood pressure reading that is higher than normal on one occasion does not mean that you need treatment. If  it is not clear whether you have high blood pressure, you may be asked to return on a different day to have your blood pressure checked again. Or, you may be asked to monitor your blood pressure at home for 1 or more weeks. TREATMENT Treating high blood pressure includes making lifestyle changes and possibly taking medicine. Living a healthy lifestyle can help lower high blood pressure. You may need to change some of your habits. Lifestyle changes may include:  Following the DASH diet. This diet is high in fruits, vegetables, and whole grains. It is low in salt, red meat, and added sugars.  Keep your sodium intake below 2,300 mg per day.  Getting at least 30-45 minutes of aerobic exercise at least 4 times per week.  Losing weight if necessary.  Not smoking.  Limiting alcoholic beverages.  Learning ways to reduce stress. Your health care provider may prescribe medicine if lifestyle changes are not enough to get your blood pressure under control, and if one of the following is true:  You are 18-59 years of age and your systolic blood pressure is above 140.  You are 60 years of age or older, and your systolic blood pressure is above 150.  Your diastolic blood pressure is above 90.  You have diabetes, and your systolic blood pressure is over 140 or your diastolic blood pressure is over 90.  You have kidney disease and your blood pressure is above 140/90.  You have heart disease and your blood pressure is above 140/90. Your personal target blood pressure may vary depending on your medical conditions, your age, and other factors. HOME CARE INSTRUCTIONS    Have your blood pressure rechecked as directed by your health care provider.   Take medicines only as directed by your health care provider. Follow the directions carefully. Blood pressure medicines must be taken as prescribed. The medicine does not work as well when you skip doses. Skipping doses also puts you at risk for  problems.  Do not smoke.   Monitor your blood pressure at home as directed by your health care provider. SEEK MEDICAL CARE IF:   You think you are having a reaction to medicines taken.  You have recurrent headaches or feel dizzy.  You have swelling in your ankles.  You have trouble with your vision. SEEK IMMEDIATE MEDICAL CARE IF:  You develop a severe headache or confusion.  You have unusual weakness, numbness, or feel faint.  You have severe chest or abdominal pain.  You vomit repeatedly.  You have trouble breathing. MAKE SURE YOU:   Understand these instructions.  Will watch your condition.  Will get help right away if you are not doing well or get worse.   This information is not intended to replace advice given to you by your health care provider. Make sure you discuss any questions you have with your health care provider.   Document Released: 06/15/2005 Document Revised: 10/30/2014 Document Reviewed: 04/07/2013 Elsevier Interactive Patient Education 2016 [employer]  

## 2015-09-23 NOTE — Progress Notes (Signed)
Pre visit review using our clinic review tool, if applicable. No additional management support is needed unless otherwise documented below in the visit note. 

## 2015-10-31 ENCOUNTER — Other Ambulatory Visit: Payer: Self-pay | Admitting: Internal Medicine

## 2015-10-31 DIAGNOSIS — Z853 Personal history of malignant neoplasm of breast: Secondary | ICD-10-CM

## 2015-10-31 DIAGNOSIS — Z9889 Other specified postprocedural states: Secondary | ICD-10-CM

## 2015-11-07 ENCOUNTER — Ambulatory Visit
Admission: RE | Admit: 2015-11-07 | Discharge: 2015-11-07 | Disposition: A | Discharge status: Home / Self Care | Payer: Commercial Managed Care - HMO | Source: Ambulatory Visit | Attending: Internal Medicine | Admitting: Internal Medicine

## 2015-11-07 DIAGNOSIS — Z9889 Other specified postprocedural states: Secondary | ICD-10-CM

## 2015-11-07 DIAGNOSIS — Z853 Personal history of malignant neoplasm of breast: Secondary | ICD-10-CM

## 2015-11-07 DIAGNOSIS — R928 Other abnormal and inconclusive findings on diagnostic imaging of breast: Secondary | ICD-10-CM | POA: Diagnosis not present

## 2015-11-07 LAB — HM MAMMOGRAPHY

## 2015-11-10 NOTE — Addendum Note (Signed)
Addended by: Janith Lima on: 11/10/2015 12:52 PM   Modules accepted: Miquel Dunn

## 2016-01-15 ENCOUNTER — Other Ambulatory Visit: Payer: Self-pay | Admitting: *Deleted

## 2016-01-15 DIAGNOSIS — I10 Essential (primary) hypertension: Secondary | ICD-10-CM

## 2016-01-15 MED ORDER — HYDROCHLOROTHIAZIDE 12.5 MG PO CAPS: 12.5000 mg | ORAL_CAPSULE | Freq: Every day | ORAL | Status: DC

## 2016-01-22 ENCOUNTER — Encounter: Payer: Self-pay | Admitting: Internal Medicine

## 2016-01-22 ENCOUNTER — Other Ambulatory Visit (INDEPENDENT_AMBULATORY_CARE_PROVIDER_SITE_OTHER): Payer: Commercial Managed Care - HMO

## 2016-01-22 ENCOUNTER — Ambulatory Visit (INDEPENDENT_AMBULATORY_CARE_PROVIDER_SITE_OTHER): Payer: Commercial Managed Care - HMO | Admitting: Internal Medicine

## 2016-01-22 VITALS — BP 154/94 | HR 60 | Temp 98.1°F | Resp 16 | Ht 59.0 in | Wt 185.5 lb

## 2016-01-22 DIAGNOSIS — I10 Essential (primary) hypertension: Secondary | ICD-10-CM | POA: Diagnosis not present

## 2016-01-22 DIAGNOSIS — E118 Type 2 diabetes mellitus with unspecified complications: Secondary | ICD-10-CM

## 2016-01-22 DIAGNOSIS — Z23 Encounter for immunization: Secondary | ICD-10-CM

## 2016-01-22 LAB — BASIC METABOLIC PANEL
BUN: 18 mg/dL (ref 6–23)
CALCIUM: 9.6 mg/dL (ref 8.4–10.5)
CO2: 27 mEq/L (ref 19–32)
CREATININE: 0.86 mg/dL (ref 0.40–1.20)
Chloride: 107 mEq/L (ref 96–112)
GFR: 80.6 mL/min (ref >60.00)
Glucose, Bld: 106 mg/dL — ABNORMAL HIGH (ref 70–99)
Potassium: 3.8 mEq/L (ref 3.5–5.1)
Sodium: 141 mEq/L (ref 135–145)

## 2016-01-22 LAB — HEMOGLOBIN A1C: HEMOGLOBIN A1C: 6.5 % (ref 4.6–6.5)

## 2016-01-22 MED ORDER — METFORMIN HCL 500 MG PO TABS: 500.0000 mg | ORAL_TABLET | Freq: Every day | ORAL | 3 refills | Status: DC

## 2016-01-22 MED ORDER — CLONIDINE HCL 0.1 MG PO TABS
0.1000 mg | ORAL_TABLET | Freq: Three times a day (TID) | ORAL | 11 refills | Status: DC
Start: 2016-01-22 — End: 2017-03-30

## 2016-01-22 MED ORDER — DILTIAZEM HCL ER 60 MG PO CP12: 60.0000 mg | ORAL_CAPSULE | Freq: Two times a day (BID) | ORAL | 5 refills | Status: DC

## 2016-01-22 NOTE — Patient Instructions (Signed)
Hypertension Hypertension, commonly called high blood pressure, is when the force of blood pumping through your arteries is too strong. Your arteries are the blood vessels that carry blood from your heart throughout your body. A blood pressure reading consists of a higher number over a lower number, such as 110/72. The higher number (systolic) is the pressure inside your arteries when your heart pumps. The lower number (diastolic) is the pressure inside your arteries when your heart relaxes. Ideally you want your blood pressure below 120/80. Hypertension forces your heart to work harder to pump blood. Your arteries may become narrow or stiff. Having untreated or uncontrolled hypertension can cause heart attack, stroke, kidney disease, and other problems. RISK FACTORS Some risk factors for high blood pressure are controllable. Others are not.  Risk factors you cannot control include:   Race. You may be at higher risk if you are African American.  Age. Risk increases with age.  Gender. Men are at higher risk than women before age 45 years. After age 65, women are at higher risk than men. Risk factors you can control include:  Not getting enough exercise or physical activity.  Being overweight.  Getting too much fat, sugar, calories, or salt in your diet.  Drinking too much alcohol. SIGNS AND SYMPTOMS Hypertension does not usually cause signs or symptoms. Extremely high blood pressure (hypertensive crisis) may cause headache, anxiety, shortness of breath, and nosebleed. DIAGNOSIS To check if you have hypertension, your health care provider will measure your blood pressure while you are seated, with your arm held at the level of your heart. It should be measured at least twice using the same arm. Certain conditions can cause a difference in blood pressure between your right and left arms. A blood pressure reading that is higher than normal on one occasion does not mean that you need treatment. If  it is not clear whether you have high blood pressure, you may be asked to return on a different day to have your blood pressure checked again. Or, you may be asked to monitor your blood pressure at home for 1 or more weeks. TREATMENT Treating high blood pressure includes making lifestyle changes and possibly taking medicine. Living a healthy lifestyle can help lower high blood pressure. You may need to change some of your habits. Lifestyle changes may include:  Following the DASH diet. This diet is high in fruits, vegetables, and whole grains. It is low in salt, red meat, and added sugars.  Keep your sodium intake below 2,300 mg per day.  Getting at least 30-45 minutes of aerobic exercise at least 4 times per week.  Losing weight if necessary.  Not smoking.  Limiting alcoholic beverages.  Learning ways to reduce stress. Your health care provider may prescribe medicine if lifestyle changes are not enough to get your blood pressure under control, and if one of the following is true:  You are 18-59 years of age and your systolic blood pressure is above 140.  You are 60 years of age or older, and your systolic blood pressure is above 150.  Your diastolic blood pressure is above 90.  You have diabetes, and your systolic blood pressure is over 140 or your diastolic blood pressure is over 90.  You have kidney disease and your blood pressure is above 140/90.  You have heart disease and your blood pressure is above 140/90. Your personal target blood pressure may vary depending on your medical conditions, your age, and other factors. HOME CARE INSTRUCTIONS    Have your blood pressure rechecked as directed by your health care provider.   Take medicines only as directed by your health care provider. Follow the directions carefully. Blood pressure medicines must be taken as prescribed. The medicine does not work as well when you skip doses. Skipping doses also puts you at risk for  problems.  Do not smoke.   Monitor your blood pressure at home as directed by your health care provider. SEEK MEDICAL CARE IF:   You think you are having a reaction to medicines taken.  You have recurrent headaches or feel dizzy.  You have swelling in your ankles.  You have trouble with your vision. SEEK IMMEDIATE MEDICAL CARE IF:  You develop a severe headache or confusion.  You have unusual weakness, numbness, or feel faint.  You have severe chest or abdominal pain.  You vomit repeatedly.  You have trouble breathing. MAKE SURE YOU:   Understand these instructions.  Will watch your condition.  Will get help right away if you are not doing well or get worse.   This information is not intended to replace advice given to you by your health care provider. Make sure you discuss any questions you have with your health care provider.   Document Released: 06/15/2005 Document Revised: 10/30/2014 Document Reviewed: 04/07/2013 Elsevier Interactive Patient Education 2016 [employer]  

## 2016-01-22 NOTE — Progress Notes (Signed)
Pre visit review using our clinic review tool, if applicable. No additional management support is needed unless otherwise documented below in the visit note. 

## 2016-01-22 NOTE — Progress Notes (Signed)
Subjective:  Patient ID: Brittany [name], female    DOB: 1928/09/11  Age: 82 y.o. MRN: [id number]  CC: Hypertension and Diabetes   HPI [name] presents for follow-up on hypertension and diabetes. She complains that hydrochlorothiazide makes her itch so she has stopped taking it. With the current combination of nebivolol and clonidine she is not getting adequate blood pressure control. She denies any recent episodes of headache/blurred vision/chest pain/shortness of breath/edema/palpitations/or fatigue.  Outpatient Medications Prior to Visit  Medication Sig Dispense Refill  . aspirin EC 81 MG EC tablet Take 81 mg by mouth daily.      Marland Kitchen atorvastatin (LIPITOR) 20 MG tablet Take 1 tablet (20 mg total) by mouth daily. 90 tablet 1  . Cholecalciferol 1000 UNIT tablet Take 1,000 Units by mouth daily.      . Glucosamine 500 MG TABS Take 500 mg by mouth daily.     . nebivolol (BYSTOLIC) 10 MG tablet Take 1 tablet (10 mg total) by mouth daily. 30 tablet 11  . potassium chloride SA (K-DUR,KLOR-CON) 20 MEQ tablet Take 1 tablet (20 mEq total) by mouth 3 (three) times daily. 270 tablet 1  . cloNIDine (CATAPRES) 0.1 MG tablet Take 1 tablet (0.1 mg total) by mouth 3 (three) times daily. 90 tablet 11  . hydrochlorothiazide (MICROZIDE) 12.5 MG capsule Take 1 capsule (12.5 mg total) by mouth daily. 90 capsule 1  . metFORMIN (GLUCOPHAGE) 500 MG tablet Take 1 tablet (500 mg total) by mouth daily with breakfast. 90 tablet 3   No facility-administered medications prior to visit.     ROS Review of Systems  Constitutional: Negative.  Negative for activity change, appetite change, diaphoresis, fatigue and unexpected weight change.  HENT: Negative.  Negative for trouble swallowing.   Eyes: Negative.   Respiratory: Negative.  Negative for cough, choking, chest tightness, shortness of breath and stridor.   Cardiovascular: Negative.  Negative for chest pain, palpitations and leg swelling.  Gastrointestinal:  Negative.  Negative for abdominal pain, constipation, diarrhea, nausea and vomiting.  Endocrine: Negative.   Genitourinary: Negative.   Musculoskeletal: Negative.  Negative for myalgias.  Skin: Negative.  Negative for color change and rash.  Allergic/Immunologic: Negative.   Neurological: Negative.  Negative for dizziness, weakness, numbness and headaches.  Hematological: Negative.  Negative for adenopathy. Does not bruise/bleed easily.  Psychiatric/Behavioral: Negative.     Objective:  BP (!) 154/94 (BP Location: Left Arm, Patient Position: Sitting, Cuff Size: Normal)   Pulse 60   Temp 98.1 F (36.7 C) (Oral)   Resp 16   Ht 4\' 11"  (1.499 m)   Wt 185 lb 8 oz (84.1 kg)   SpO2 97%   BMI 37.47 kg/m   BP Readings from Last 3 Encounters:  01/22/16 (!) 154/94  09/23/15 (!) 170/90  07/16/15 (!) 162/92    Wt Readings from Last 3 Encounters:  01/22/16 185 lb 8 oz (84.1 kg)  09/23/15 183 lb (83 kg)  07/16/15 177 lb (80.3 kg)    Physical Exam  Constitutional: She is oriented to person, place, and time. No distress.  HENT:  Mouth/Throat: Oropharynx is clear and moist. No oropharyngeal exudate.  Eyes: Conjunctivae are normal. Right eye exhibits no discharge. Left eye exhibits no discharge. No scleral icterus.  Neck: Normal range of motion. Neck supple. No JVD present. No tracheal deviation present. No thyromegaly present.  Cardiovascular: Normal rate, regular rhythm, normal heart sounds and intact distal pulses.  Exam reveals no gallop and no friction rub.  No murmur heard. Pulmonary/Chest: Effort normal and breath sounds normal. No stridor. No respiratory distress. She has no wheezes. She has no rales. She exhibits no tenderness.  Abdominal: Soft. Bowel sounds are normal. She exhibits no distension and no mass. There is no tenderness. There is no rebound and no guarding.  Musculoskeletal: Normal range of motion. She exhibits no edema or tenderness.  Lymphadenopathy:    She has no  cervical adenopathy.  Neurological: She is oriented to person, place, and time.  Skin: Skin is warm and dry. No rash noted. She is not diaphoretic. No erythema. No pallor.  Vitals reviewed.   Lab Results  Component Value Date   WBC 4.6 09/23/2015   HGB 11.9 (L) 09/23/2015   HCT 37.2 09/23/2015   PLT 450.0 (H) 09/23/2015   GLUCOSE 106 (H) 01/22/2016   CHOL 164 05/15/2015   TRIG 69.0 05/15/2015   HDL 73.30 05/15/2015   LDLDIRECT 119.8 04/02/2008   LDLCALC 77 05/15/2015   ALT 15 10/04/2013   AST 18 10/04/2013   NA 141 01/22/2016   K 3.8 01/22/2016   CL 107 01/22/2016   CREATININE 0.86 01/22/2016   BUN 18 01/22/2016   CO2 27 01/22/2016   TSH 3.23 07/25/2014   HGBA1C 6.5 01/22/2016   MICROALBUR <0.7 09/23/2015    Mm Diag Breast Tomo Bilateral  Result Date: 11/07/2015 CLINICAL DATA:  Annual diagnostic mammogram. Patient underwent right breast lumpectomy in April 2010. This will complete 7 years of diagnostic surveillance. EXAM: 2D DIGITAL DIAGNOSTIC BILATERAL MAMMOGRAM WITH CAD AND ADJUNCT TOMO COMPARISON:  Previous exam(s). ACR Breast Density Category a: The breast tissue is almost entirely fatty. FINDINGS: Architectural distortion reflecting postsurgical scarring on the right is stable. There are no discrete masses or other areas of architectural distortion. There are no suspicious calcifications. Mammographic images were processed with CAD. IMPRESSION: No evidence of recurrent or new breast malignancy. Benign postsurgical changes on the right. RECOMMENDATION: Screening mammogram in one year.(Code:SM-B-01Y) I have discussed the findings and recommendations with the patient. Results were also provided in writing at the conclusion of the visit. If applicable, a reminder letter will be sent to the patient regarding the next appointment. BI-RADS CATEGORY  2: Benign. Electronically Signed   By: Lajean Manes M.D.   On: 11/07/2015 08:16    Assessment & Plan:   [name] was seen today for  hypertension and diabetes.  Diagnoses and all orders for this visit:  Essential hypertension, benign- Her blood pressure is not adequately well controlled, will add diltiazem to the current regimen. Her electrolytes and renal function are stable. -     diltiazem (CARDIZEM SR) 60 MG 12 hr capsule; Take 1 capsule (60 mg total) by mouth 2 (two) times daily. -     Basic metabolic panel; Future -     cloNIDine (CATAPRES) 0.1 MG tablet; Take 1 tablet (0.1 mg total) by mouth 3 (three) times daily.  Type 2 diabetes mellitus with complication, without long-term current use of insulin ([place])- her A1c is 6.5%, her blood sugars are adequately well controlled. -     diltiazem (CARDIZEM SR) 60 MG 12 hr capsule; Take 1 capsule (60 mg total) by mouth 2 (two) times daily. -     Basic metabolic panel; Future -     Hemoglobin A1c; Future -     metFORMIN (GLUCOPHAGE) 500 MG tablet; Take 1 tablet (500 mg total) by mouth daily with breakfast.  Need for prophylactic vaccination against Streptococcus pneumoniae (pneumococcus) -  Pneumococcal polysaccharide vaccine 23-valent greater than or equal to 2yo subcutaneous/IM   I have discontinued Ms. Odonnell's hydrochlorothiazide. I am also having her start on diltiazem. Additionally, I am having her maintain her aspirin EC, Cholecalciferol, Glucosamine, potassium chloride SA, nebivolol, atorvastatin, cloNIDine, and metFORMIN.  Meds ordered this encounter  Medications  . diltiazem (CARDIZEM SR) 60 MG 12 hr capsule    Sig: Take 1 capsule (60 mg total) by mouth 2 (two) times daily.    Dispense:  60 capsule    Refill:  5  . cloNIDine (CATAPRES) 0.1 MG tablet    Sig: Take 1 tablet (0.1 mg total) by mouth 3 (three) times daily.    Dispense:  90 tablet    Refill:  11  . metFORMIN (GLUCOPHAGE) 500 MG tablet    Sig: Take 1 tablet (500 mg total) by mouth daily with breakfast.    Dispense:  90 tablet    Refill:  3     Follow-up: Return in about 6 weeks (around  03/04/2016).  Scarlette Calico, MD

## 2016-03-09 ENCOUNTER — Other Ambulatory Visit: Payer: Self-pay | Admitting: Internal Medicine

## 2016-03-09 DIAGNOSIS — E785 Hyperlipidemia, unspecified: Secondary | ICD-10-CM

## 2016-04-15 ENCOUNTER — Other Ambulatory Visit (INDEPENDENT_AMBULATORY_CARE_PROVIDER_SITE_OTHER): Payer: Commercial Managed Care - HMO

## 2016-04-15 ENCOUNTER — Ambulatory Visit (INDEPENDENT_AMBULATORY_CARE_PROVIDER_SITE_OTHER): Payer: Commercial Managed Care - HMO | Admitting: Internal Medicine

## 2016-04-15 ENCOUNTER — Encounter: Payer: Self-pay | Admitting: Internal Medicine

## 2016-04-15 ENCOUNTER — Other Ambulatory Visit: Payer: Commercial Managed Care - HMO

## 2016-04-15 VITALS — BP 200/118 | HR 73 | Temp 98.0°F | Resp 16 | Ht 59.0 in | Wt 187.2 lb

## 2016-04-15 DIAGNOSIS — E785 Hyperlipidemia, unspecified: Secondary | ICD-10-CM

## 2016-04-15 DIAGNOSIS — I10 Essential (primary) hypertension: Secondary | ICD-10-CM

## 2016-04-15 DIAGNOSIS — D539 Nutritional anemia, unspecified: Secondary | ICD-10-CM

## 2016-04-15 DIAGNOSIS — G4700 Insomnia, unspecified: Secondary | ICD-10-CM | POA: Insufficient documentation

## 2016-04-15 DIAGNOSIS — E559 Vitamin D deficiency, unspecified: Secondary | ICD-10-CM

## 2016-04-15 DIAGNOSIS — D509 Iron deficiency anemia, unspecified: Secondary | ICD-10-CM | POA: Insufficient documentation

## 2016-04-15 DIAGNOSIS — D508 Other iron deficiency anemias: Secondary | ICD-10-CM

## 2016-04-15 LAB — VITAMIN D 25 HYDROXY (VIT D DEFICIENCY, FRACTURES): VITD: 75.66 ng/mL (ref 30.00–100.00)

## 2016-04-15 LAB — URINALYSIS, ROUTINE W REFLEX MICROSCOPIC
BILIRUBIN URINE: NEGATIVE
HGB URINE DIPSTICK: NEGATIVE
Ketones, ur: NEGATIVE
LEUKOCYTES UA: NEGATIVE
NITRITE: NEGATIVE
Specific Gravity, Urine: 1.015 (ref 1.000–1.030)
TOTAL PROTEIN, URINE-UPE24: NEGATIVE
UROBILINOGEN UA: 0.2 (ref 0.0–1.0)
Urine Glucose: NEGATIVE
pH: 5.5 (ref 5.0–8.0)

## 2016-04-15 LAB — VITAMIN B12: Vitamin B-12: 381 pg/mL (ref 211–911)

## 2016-04-15 LAB — LIPID PANEL
CHOLESTEROL: 160 mg/dL (ref 0–200)
HDL: 79.5 mg/dL (ref >39.00)
LDL Cholesterol: 70 mg/dL (ref 0–99)
NonHDL: 80.28
TRIGLYCERIDES: 50 mg/dL (ref 0.0–149.0)
Total CHOL/HDL Ratio: 2
VLDL: 10 mg/dL (ref 0.0–40.0)

## 2016-04-15 LAB — FERRITIN: Ferritin: 10 ng/mL (ref 10.0–291.0)

## 2016-04-15 LAB — FOLATE: Folate: >23.2 ng/mL (ref >5.9)

## 2016-04-15 LAB — CORTISOL: Cortisol, Plasma: 8.9 ug/dL

## 2016-04-15 LAB — IBC PANEL
IRON: 36 ug/dL — AB (ref 42–145)
Saturation Ratios: 9.3 % — ABNORMAL LOW (ref 20.0–50.0)
Transferrin: 277 mg/dL (ref 212.0–360.0)

## 2016-04-15 MED ORDER — DOXEPIN HCL 6 MG PO TABS: 1.0000 | ORAL_TABLET | Freq: Every evening | ORAL | 1 refills | Status: DC | PRN

## 2016-04-15 MED ORDER — HYDROCHLOROTHIAZIDE 25 MG PO TABS: 25.0000 mg | ORAL_TABLET | Freq: Every day | ORAL | 1 refills | Status: DC

## 2016-04-15 MED ORDER — FERROUS SULFATE 325 (65 FE) MG PO TABS: 325.0000 mg | ORAL_TABLET | Freq: Two times a day (BID) | ORAL | 3 refills | Status: DC

## 2016-04-15 MED ORDER — NEBIVOLOL HCL 10 MG PO TABS: 10.0000 mg | ORAL_TABLET | Freq: Every day | ORAL | 11 refills | Status: DC

## 2016-04-15 NOTE — Progress Notes (Signed)
Pre visit review using our clinic review tool, if applicable. No additional management support is needed unless otherwise documented below in the visit note. 

## 2016-04-15 NOTE — Patient Instructions (Signed)
Hypertension Hypertension, commonly called high blood pressure, is when the force of blood pumping through your arteries is too strong. Your arteries are the blood vessels that carry blood from your heart throughout your body. A blood pressure reading consists of a higher number over a lower number, such as 110/72. The higher number (systolic) is the pressure inside your arteries when your heart pumps. The lower number (diastolic) is the pressure inside your arteries when your heart relaxes. Ideally you want your blood pressure below 120/80. Hypertension forces your heart to work harder to pump blood. Your arteries may become narrow or stiff. Having untreated or uncontrolled hypertension can cause heart attack, stroke, kidney disease, and other problems. RISK FACTORS Some risk factors for high blood pressure are controllable. Others are not.  Risk factors you cannot control include:   Race. You may be at higher risk if you are African American.  Age. Risk increases with age.  Gender. Men are at higher risk than women before age 45 years. After age 65, women are at higher risk than men. Risk factors you can control include:  Not getting enough exercise or physical activity.  Being overweight.  Getting too much fat, sugar, calories, or salt in your diet.  Drinking too much alcohol. SIGNS AND SYMPTOMS Hypertension does not usually cause signs or symptoms. Extremely high blood pressure (hypertensive crisis) may cause headache, anxiety, shortness of breath, and nosebleed. DIAGNOSIS To check if you have hypertension, your health care provider will measure your blood pressure while you are seated, with your arm held at the level of your heart. It should be measured at least twice using the same arm. Certain conditions can cause a difference in blood pressure between your right and left arms. A blood pressure reading that is higher than normal on one occasion does not mean that you need treatment. If  it is not clear whether you have high blood pressure, you may be asked to return on a different day to have your blood pressure checked again. Or, you may be asked to monitor your blood pressure at home for 1 or more weeks. TREATMENT Treating high blood pressure includes making lifestyle changes and possibly taking medicine. Living a healthy lifestyle can help lower high blood pressure. You may need to change some of your habits. Lifestyle changes may include:  Following the DASH diet. This diet is high in fruits, vegetables, and whole grains. It is low in salt, red meat, and added sugars.  Keep your sodium intake below 2,300 mg per day.  Getting at least 30-45 minutes of aerobic exercise at least 4 times per week.  Losing weight if necessary.  Not smoking.  Limiting alcoholic beverages.  Learning ways to reduce stress. Your health care provider may prescribe medicine if lifestyle changes are not enough to get your blood pressure under control, and if one of the following is true:  You are 18-59 years of age and your systolic blood pressure is above 140.  You are 60 years of age or older, and your systolic blood pressure is above 150.  Your diastolic blood pressure is above 90.  You have diabetes, and your systolic blood pressure is over 140 or your diastolic blood pressure is over 90.  You have kidney disease and your blood pressure is above 140/90.  You have heart disease and your blood pressure is above 140/90. Your personal target blood pressure may vary depending on your medical conditions, your age, and other factors. HOME CARE INSTRUCTIONS    Have your blood pressure rechecked as directed by your health care provider.   Take medicines only as directed by your health care provider. Follow the directions carefully. Blood pressure medicines must be taken as prescribed. The medicine does not work as well when you skip doses. Skipping doses also puts you at risk for  problems.  Do not smoke.   Monitor your blood pressure at home as directed by your health care provider. SEEK MEDICAL CARE IF:   You think you are having a reaction to medicines taken.  You have recurrent headaches or feel dizzy.  You have swelling in your ankles.  You have trouble with your vision. SEEK IMMEDIATE MEDICAL CARE IF:  You develop a severe headache or confusion.  You have unusual weakness, numbness, or feel faint.  You have severe chest or abdominal pain.  You vomit repeatedly.  You have trouble breathing. MAKE SURE YOU:   Understand these instructions.  Will watch your condition.  Will get help right away if you are not doing well or get worse.   This information is not intended to replace advice given to you by your health care provider. Make sure you discuss any questions you have with your health care provider.   Document Released: 06/15/2005 Document Revised: 10/30/2014 Document Reviewed: 04/07/2013 Elsevier Interactive Patient Education 2016 [employer]  

## 2016-04-15 NOTE — Progress Notes (Signed)
Subjective:  Patient ID: Brittany [name], female    DOB: 22-Jul-1928  Age: 82 y.o. MRN: [id number]  CC: Hypertension and Anemia   HPI [name] presents for follow-up on hypertension and anemia. She is concerned because her blood pressure has not been well controlled on the current combination of clonidine, diltiazem, and nebivolol. Fortunately, she has had no recent episodes of headache, blurred vision, chest pain, shortness of breath, palpitations, edema, or syncope.  She complains of insomnia that she describes as frequent awakenings and daytime fatigue. She denies signs and symptoms of depression or anxiety.  Outpatient Medications Prior to Visit  Medication Sig Dispense Refill  . aspirin EC 81 MG EC tablet Take 81 mg by mouth daily.      Marland Kitchen atorvastatin (LIPITOR) 20 MG tablet take 1 tablet by mouth once daily 90 tablet 1  . Cholecalciferol 1000 UNIT tablet Take 1,000 Units by mouth daily.      . cloNIDine (CATAPRES) 0.1 MG tablet Take 1 tablet (0.1 mg total) by mouth 3 (three) times daily. 90 tablet 11  . diltiazem (CARDIZEM SR) 60 MG 12 hr capsule Take 1 capsule (60 mg total) by mouth 2 (two) times daily. 60 capsule 5  . Glucosamine 500 MG TABS Take 500 mg by mouth daily.     . metFORMIN (GLUCOPHAGE) 500 MG tablet Take 1 tablet (500 mg total) by mouth daily with breakfast. 90 tablet 3  . potassium chloride SA (K-DUR,KLOR-CON) 20 MEQ tablet Take 1 tablet (20 mEq total) by mouth 3 (three) times daily. 270 tablet 1  . nebivolol (BYSTOLIC) 10 MG tablet Take 1 tablet (10 mg total) by mouth daily. 30 tablet 11   No facility-administered medications prior to visit.     ROS Review of Systems  Constitutional: Positive for fatigue. Negative for appetite change, chills, diaphoresis and fever.  HENT: Negative.   Eyes: Negative.  Negative for visual disturbance.  Respiratory: Negative for cough, choking, chest tightness, shortness of breath and stridor.   Cardiovascular: Negative.   Negative for chest pain, palpitations and leg swelling.  Gastrointestinal: Negative for abdominal pain, constipation, diarrhea, nausea and vomiting.  Endocrine: Negative.   Genitourinary: Negative.  Negative for difficulty urinating.  Musculoskeletal: Negative for arthralgias, back pain, myalgias and neck pain.  Skin: Negative.   Allergic/Immunologic: Negative.   Neurological: Negative for dizziness, weakness, light-headedness and headaches.  Hematological: Negative for adenopathy. Does not bruise/bleed easily.  Psychiatric/Behavioral: Positive for sleep disturbance. Negative for confusion, decreased concentration, dysphoric mood, self-injury and suicidal ideas. The patient is not nervous/anxious and is not hyperactive.     Objective:  BP (!) 200/118 (BP Location: Left Arm, Patient Position: Sitting, Cuff Size: Normal)   Pulse 73   Temp 98 F (36.7 C) (Oral)   Resp 16   Ht 4\' 11"  (1.499 m)   Wt 187 lb 4 oz (84.9 kg)   SpO2 96%   BMI 37.82 kg/m   BP Readings from Last 3 Encounters:  04/15/16 (!) 200/118  01/22/16 (!) 154/94  09/23/15 (!) 170/90    Wt Readings from Last 3 Encounters:  04/15/16 187 lb 4 oz (84.9 kg)  01/22/16 185 lb 8 oz (84.1 kg)  09/23/15 183 lb (83 kg)    Physical Exam  Constitutional: She is oriented to person, place, and time. No distress.  HENT:  Mouth/Throat: Oropharynx is clear and moist. No oropharyngeal exudate.  Eyes: Conjunctivae are normal. Right eye exhibits no discharge. Left eye exhibits no discharge. No  scleral icterus.  Neck: Normal range of motion. Neck supple. No JVD present. No tracheal deviation present. No thyromegaly present.  Cardiovascular: Normal rate, regular rhythm, normal heart sounds and intact distal pulses.  Exam reveals no gallop and no friction rub.   No murmur heard. Pulmonary/Chest: Effort normal and breath sounds normal. No stridor. No respiratory distress. She has no wheezes. She has no rales. She exhibits no tenderness.   Abdominal: Soft. Bowel sounds are normal. She exhibits no distension and no mass. There is no tenderness. There is no rebound and no guarding.  Musculoskeletal: Normal range of motion. She exhibits no edema, tenderness or deformity.  Lymphadenopathy:    She has no cervical adenopathy.  Neurological: She is oriented to person, place, and time.  Skin: Skin is warm and dry. No rash noted. She is not diaphoretic. No erythema. No pallor.  Psychiatric: She has a normal mood and affect. Her behavior is normal. Judgment and thought content normal.  Vitals reviewed.   Lab Results  Component Value Date   WBC 4.6 09/23/2015   HGB 11.9 (L) 09/23/2015   HCT 37.2 09/23/2015   PLT 450.0 (H) 09/23/2015   GLUCOSE 106 (H) 01/22/2016   CHOL 160 04/15/2016   TRIG 50.0 04/15/2016   HDL 79.50 04/15/2016   LDLDIRECT 119.8 04/02/2008   LDLCALC 70 04/15/2016   ALT 15 10/04/2013   AST 18 10/04/2013   NA 141 01/22/2016   K 3.8 01/22/2016   CL 107 01/22/2016   CREATININE 0.86 01/22/2016   BUN 18 01/22/2016   CO2 27 01/22/2016   TSH 2.10 04/15/2016   HGBA1C 6.5 01/22/2016   MICROALBUR <0.7 09/23/2015    Mm Diag Breast Tomo Bilateral  Result Date: 11/07/2015 CLINICAL DATA:  Annual diagnostic mammogram. Patient underwent right breast lumpectomy in April 2010. This will complete 7 years of diagnostic surveillance. EXAM: 2D DIGITAL DIAGNOSTIC BILATERAL MAMMOGRAM WITH CAD AND ADJUNCT TOMO COMPARISON:  Previous exam(s). ACR Breast Density Category a: The breast tissue is almost entirely fatty. FINDINGS: Architectural distortion reflecting postsurgical scarring on the right is stable. There are no discrete masses or other areas of architectural distortion. There are no suspicious calcifications. Mammographic images were processed with CAD. IMPRESSION: No evidence of recurrent or new breast malignancy. Benign postsurgical changes on the right. RECOMMENDATION: Screening mammogram in one year.(Code:SM-B-01Y) I have  discussed the findings and recommendations with the patient. Results were also provided in writing at the conclusion of the visit. If applicable, a reminder letter will be sent to the patient regarding the next appointment. BI-RADS CATEGORY  2: Benign. Electronically Signed   By: Lajean Manes M.D.   On: 11/07/2015 08:16    Assessment & Plan:   Taliana was seen today for hypertension and anemia.  Diagnoses and all orders for this visit:  Essential hypertension, benign- her blood pressure is not adequately well controlled, her vitamin D level is normal so this is not caused by vitamin D deficiency and her cortisol is level so I don't think she has adrenal access. Her electrolytes and renal function are stable. Will add a thiazide diuretic to her current regimen for better blood pressure control. -     VITAMIN D 25 Hydroxy (Vit-D Deficiency, Fractures); Future -     Cortisol; Future -     Urinalysis, Routine w reflex microscopic (not at [facility]); Future -     nebivolol (BYSTOLIC) 10 MG tablet; Take 1 tablet (10 mg total) by mouth daily. -  hydrochlorothiazide (HYDRODIURIL) 25 MG tablet; Take 1 tablet (25 mg total) by mouth daily.  Hyperlipidemia with target LDL less than 100- she has achieved her LDL goal is doing well on the statin -     Lipid panel; Future -     Thyroid Panel With TSH; Future  Vitamin D deficiency- improvement noted -     VITAMIN D 25 Hydroxy (Vit-D Deficiency, Fractures); Future  Deficiency anemia- she has iron deficiency anemia -     IBC panel; Future -     Folate; Future -     Ferritin; Future -     Vitamin B12; Future -     ferrous sulfate 325 (65 FE) MG tablet; Take 1 tablet (325 mg total) by mouth 2 (two) times daily with a meal.  Middle insomnia -     Doxepin HCl (SILENOR) 6 MG TABS; Take 1 tablet (6 mg total) by mouth at bedtime as needed.  Other iron deficiency anemia- will start iron replacement therapy. If the anemia and iron deficiency doesn't improve soon  and then will consider referral to GI for evaluation of possible sources of blood loss. -     ferrous sulfate 325 (65 FE) MG tablet; Take 1 tablet (325 mg total) by mouth 2 (two) times daily with a meal.   I am having Ms. Laskin start on hydrochlorothiazide, Doxepin HCl, and ferrous sulfate. I am also having her maintain her aspirin EC, Cholecalciferol, Glucosamine, potassium chloride SA, diltiazem, cloNIDine, metFORMIN, atorvastatin, and nebivolol.  Meds ordered this encounter  Medications  . nebivolol (BYSTOLIC) 10 MG tablet    Sig: Take 1 tablet (10 mg total) by mouth daily.    Dispense:  30 tablet    Refill:  11  . hydrochlorothiazide (HYDRODIURIL) 25 MG tablet    Sig: Take 1 tablet (25 mg total) by mouth daily.    Dispense:  90 tablet    Refill:  1  . Doxepin HCl (SILENOR) 6 MG TABS    Sig: Take 1 tablet (6 mg total) by mouth at bedtime as needed.    Dispense:  90 tablet    Refill:  1  . ferrous sulfate 325 (65 FE) MG tablet    Sig: Take 1 tablet (325 mg total) by mouth 2 (two) times daily with a meal.    Dispense:  180 tablet    Refill:  3     Follow-up: Return in about 4 weeks (around 05/13/2016).  Scarlette Calico, MD

## 2016-04-16 LAB — THYROID PANEL WITH TSH
FREE THYROXINE INDEX: 1.8 (ref 1.4–3.8)
T3 UPTAKE: 29 % (ref 22–35)
T4, Total: 6.2 ug/dL (ref 4.5–12.0)
TSH: 2.1 m[IU]/L

## 2016-04-20 ENCOUNTER — Telehealth: Payer: Self-pay | Admitting: Internal Medicine

## 2016-04-20 NOTE — Telephone Encounter (Signed)
Pt called wants to speak to the assistant concern about 2 BP meds, does he meant to put her on both? Please call her back

## 2016-04-21 NOTE — Telephone Encounter (Signed)
Yes, take both

## 2016-04-21 NOTE — Telephone Encounter (Signed)
Pt contacted and HCTZ was added. However, she questions the diltiazem. I did not see where it was to be continued in OV notes.

## 2016-04-21 NOTE — Telephone Encounter (Signed)
Pt informed to continue diltiazem, bystolic and HCTZ.

## 2016-06-03 NOTE — Progress Notes (Signed)
Pre visit review using our clinic review tool, if applicable. No additional management support is needed unless otherwise documented below in the visit note. 

## 2016-06-03 NOTE — Progress Notes (Addendum)
Subjective:   Brittany [name] is a 82 y.o. female who presents for Medicare Annual (Subsequent) preventive examination.  The Patient was informed that the wellness visit is to identify future health risk and educate and initiate measures that can reduce risk for increased disease through the lifespan.   Describes health as fair, good or great? "good" Enjoys reading. Recently retired Emergency planning/management officer.   Review of Systems:  No ROS.  Medicare Wellness Visit.    Sleep patterns: Sleeps 7 hours.   Home Safety/Smoke Alarms:  Smoke detectors and security in place.  Living environment; residence and Firearm Safety: Son lives with patient in 2 story home, uses rail. No firearms.  Seat Belt Safety/Bike Helmet: Wears seatbelt.    Counseling:   Eye Exam-Last exam 2016, followed yearly. Will make appointment.  Dental-Last exam 04/2016, followed yearly.  Female:   Pap-N/A       Mammo-11/07/15, negative. Order placed for 2018.        Dexa scan-04/07/13, Osteoporosis. Vitamin D supplement. Order placed.     CCS-colonoscopy 01/17/2002, diverticulosis. No recall.       Objective:     Vitals: BP (!) 148/80 (BP Location: Right Arm, Patient Position: Sitting, Cuff Size: Normal)   Pulse 70   Ht 4\' 11"  (1.499 m)   Wt 182 lb 0.6 oz (82.6 kg)   SpO2 97%   BMI 36.77 kg/m   Body mass index is 36.77 kg/m.  Diabetic Foot Exam - Simple   Simple Foot Form Diabetic Foot exam was performed with the following findings:  Yes   Visual Inspection No deformities, no ulcerations, no other skin breakdown bilaterally:  Yes Sensation Testing Intact to touch and monofilament testing bilaterally:  Yes Pulse Check Posterior Tibialis and Dorsalis pulse intact bilaterally:  Yes Comments      Tobacco History  Smoking Status  . Never Smoker  Smokeless Tobacco  . Never Used     Counseling given: Not Answered   Past Medical History:  Diagnosis Date  . Cancer ([place])    right breast  . Diabetes  mellitus    type 2. 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 Diverticulosis of colon   . DJD (degenerative joint disease) of knee    Left  . Hyperlipidemia   . Hypertension   . Osteoporosis   . Positive PPD    history off   Past Surgical History:  Procedure Laterality Date  . BREAST SURGERY  2010   + RT   Family History  Problem Relation Age of Onset  . Heart disease Mother   . Diabetes Father   . Diabetes      1st degree relative  . Hypertension    . Stroke    . Cancer      cervical  . Lupus    . Coronary artery disease     History  Sexual Activity  . Sexual activity: No    Outpatient Encounter Prescriptions as of 06/04/2016  Medication Sig  . aspirin EC 81 MG EC tablet Take 81 mg by mouth every other day.   Marland Kitchen atorvastatin (LIPITOR) 20 MG tablet take 1 tablet by mouth once daily  . Cholecalciferol 1000 UNIT tablet Take 1,000 Units by mouth daily.    . cloNIDine (CATAPRES) 0.1 MG tablet Take 1 tablet (0.1 mg total) by mouth 3 (three) times daily.  Marland Kitchen diltiazem (CARDIZEM SR) 60 MG 12 hr capsule Take 1 capsule (60 mg total) by mouth 2 (  two) times daily.  . ferrous sulfate 325 (65 FE) MG tablet Take 1 tablet (325 mg total) by mouth 2 (two) times daily with a meal.  . Glucosamine 500 MG TABS Take 500 mg by mouth daily.   . hydrochlorothiazide (HYDRODIURIL) 25 MG tablet Take 1 tablet (25 mg total) by mouth daily.  . metFORMIN (GLUCOPHAGE) 500 MG tablet Take 1 tablet (500 mg total) by mouth daily with breakfast.  . nebivolol (BYSTOLIC) 10 MG tablet Take 1 tablet (10 mg total) by mouth daily.  . potassium chloride SA (K-DUR,KLOR-CON) 20 MEQ tablet Take 1 tablet (20 mEq total) by mouth 3 (three) times daily.  . Doxepin HCl (SILENOR) 6 MG TABS Take 1 tablet (6 mg total) by mouth at bedtime as needed. (Patient not taking: Reported on 06/04/2016)   No facility-administered encounter medications on file as of 06/04/2016.     Activities of Daily Living In your present  state of health, do you have any difficulty performing the following activities: 06/04/2016  Hearing? N  Vision? N  Difficulty concentrating or making decisions? N  Walking or climbing stairs? N  Dressing or bathing? N  Doing errands, shopping? N  Preparing Food and eating ? N  Using the Toilet? N  In the past six months, have you accidently leaked urine? N  Do you have problems with loss of bowel control? N  Managing your Medications? N  Managing your Finances? N  Housekeeping or managing your Housekeeping? N  Some recent data might be hidden    Patient Care Team: Janith Lima, MD as PCP - General Eston Esters, MD (Hematology and Oncology) Kyung Rudd, MD (Radiation Oncology) Neldon Mc, MD as Surgeon (General Surgery)    Assessment:    Physical assessment deferred to PCP.  Exercise Activities and Dietary recommendations Current Exercise Habits: Home exercise routine (yoga. Stays active. ), Type of exercise: Other - see comments [place]), Time (Minutes): 30, Frequency (Times/Week): 3, Weekly Exercise (Minutes/Week): 90, Exercise limited by: None identified   Diet (meal preparation, eat out, water intake, caffeinated beverages, dairy products, fruits and vegetables): Eats at home majority of time. Drinks water and bottled Lipton tea.   Breakfast: cereal, fruit Lunch: skips Dinner: salads, vegetables, beans  Encouraged to eat at least 3 meals/day and increase water intake. Encouraged to continue exercise routine and activity level.   Goals      Patient Stated   . <enter goal here> (pt-stated)          Maintain current health by remaining active and eating healthy.       Fall Risk Fall Risk  06/04/2016 01/08/2015 02/09/2013  Falls in the past year? No No No   Depression Screen PHQ 2/9 Scores 06/04/2016 01/08/2015 02/09/2013  PHQ - 2 Score 0 0 0     Cognitive Function       Ad8 score reviewed for issues:  Issues making decisions:no  Less interest in  hobbies / activities:no  Repeats questions, stories (family complaining):no  Trouble using ordinary gadgets (microwave, computer, phone):no  Forgets the month or year: no  Mismanaging finances: no  Remembering appts:no  Daily problems with thinking and/or memory:no Ad8 score is=0     Immunization History  Administered Date(s) Administered  . Influenza Whole 03/31/2012, 03/21/2013  . Influenza-Unspecified 02/28/2015, 04/07/2016  . Pneumococcal Conjugate-13 12/25/2013  . Pneumococcal Polysaccharide-23 04/30/2005, 03/12/2010, 01/22/2016  . Td 06/29/1994  . Zoster 09/23/2010   Screening Tests Health Maintenance  Topic Date Due  . OPHTHALMOLOGY  EXAM  11/01/2013  . HEMOGLOBIN A1C  07/24/2016  . URINE MICROALBUMIN  09/22/2016  . FOOT EXAM  06/04/2017  . TETANUS/TDAP  05/27/2020  . INFLUENZA VACCINE  Addressed  . DEXA SCAN  Completed  . ZOSTAVAX  Completed  . PNA vac Low Risk Adult  Completed   Patient will schedule diabetic eye exam. Dexa scan and mammogram ordered. Patient would like to have both after May 2018.     Plan:     Continue to eat heart healthy diet (full of fruits, vegetables, whole grains, lean protein, water--limit salt, fat, and sugar intake) and increase physical activity as tolerated.  Continue doing brain stimulating activities (puzzles, reading, adult coloring books, staying active) to keep memory sharp.   Schedule bone scan and mammogram after 11/06/16.   Schedule eye exam.   **FYI-Dull pain in right breast (h/o breast ca). F/U appt with PCP scheduled for 06/16/16.   During the course of the visit the patient was educated and counseled about the following appropriate screening and preventive services:   Vaccines to include Pneumoccal, Influenza, Hepatitis B, Td, Zostavax, HCV  Cardiovascular Disease  Colorectal cancer screening  Bone density screening  Diabetes screening  Glaucoma screening  Mammography/PAP  Nutrition counseling    Patient Instructions (the written plan) was given to the patient.   Gerilyn Nestle, RN  06/04/2016   Medical screening examination/treatment/procedure(s) were performed by non-physician practitioner and as supervising physician I was immediately available for consultation/collaboration. I agree with above. Scarlette Calico, MD

## 2016-06-04 ENCOUNTER — Ambulatory Visit (INDEPENDENT_AMBULATORY_CARE_PROVIDER_SITE_OTHER): Payer: Commercial Managed Care - HMO

## 2016-06-04 VITALS — BP 148/80 | HR 70 | Ht 59.0 in | Wt 182.0 lb

## 2016-06-04 DIAGNOSIS — Z1239 Encounter for other screening for malignant neoplasm of breast: Secondary | ICD-10-CM

## 2016-06-04 DIAGNOSIS — Z1231 Encounter for screening mammogram for malignant neoplasm of breast: Secondary | ICD-10-CM

## 2016-06-04 DIAGNOSIS — E2839 Other primary ovarian failure: Secondary | ICD-10-CM | POA: Diagnosis not present

## 2016-06-04 DIAGNOSIS — Z0000 Encounter for general adult medical examination without abnormal findings: Secondary | ICD-10-CM

## 2016-06-04 DIAGNOSIS — Z780 Asymptomatic menopausal state: Secondary | ICD-10-CM

## 2016-06-04 NOTE — Patient Instructions (Addendum)
Continue to eat heart healthy diet (full of fruits, vegetables, whole grains, lean protein, water--limit salt, fat, and sugar intake) and increase physical activity as tolerated.  Continue doing brain stimulating activities (puzzles, reading, adult coloring books, staying active) to keep memory sharp.   Schedule bone scan and mammogram after 11/06/16.   Schedule eye exam.    Fall Prevention in the Home Introduction Falls can cause injuries. They can happen to people of all ages. There are many things you can do to make your home safe and to help prevent falls. What can I do on the outside of my home?  Regularly fix the edges of walkways and driveways and fix any cracks.  Remove anything that might make you trip as you walk through a door, such as a raised step or threshold.  Trim any bushes or trees on the path to your home.  Use bright outdoor lighting.  Clear any walking paths of anything that might make someone trip, such as rocks or tools.  Regularly check to see if handrails are loose or broken. Make sure that both sides of any steps have handrails.  Any raised decks and porches should have guardrails on the edges.  Have any leaves, snow, or ice cleared regularly.  Use sand or salt on walking paths during winter.  Clean up any spills in your garage right away. This includes oil or grease spills. What can I do in the bathroom?  Use night lights.  Install grab bars by the toilet and in the tub and shower. Do not use towel bars as grab bars.  Use non-skid mats or decals in the tub or shower.  If you need to sit down in the shower, use a plastic, non-slip stool.  Keep the floor dry. Clean up any water that spills on the floor as soon as it happens.  Remove soap buildup in the tub or shower regularly.  Attach bath mats securely with double-sided non-slip rug tape.  Do not have throw rugs and other things on the floor that can make you trip. What can I do in the  bedroom?  Use night lights.  Make sure that you have a light by your bed that is easy to reach.  Do not use any sheets or blankets that are too big for your bed. They should not hang down onto the floor.  Have a firm chair that has side arms. You can use this for support while you get dressed.  Do not have throw rugs and other things on the floor that can make you trip. What can I do in the kitchen?  Clean up any spills right away.  Avoid walking on wet floors.  Keep items that you use a lot in easy-to-reach places.  If you need to reach something above you, use a strong step stool that has a grab bar.  Keep electrical cords out of the way.  Do not use floor polish or wax that makes floors slippery. If you must use wax, use non-skid floor wax.  Do not have throw rugs and other things on the floor that can make you trip. What can I do with my stairs?  Do not leave any items on the stairs.  Make sure that there are handrails on both sides of the stairs and use them. Fix handrails that are broken or loose. Make sure that handrails are as long as the stairways.  Check any carpeting to make sure that it is firmly attached to  the stairs. Fix any carpet that is loose or worn.  Avoid having throw rugs at the top or bottom of the stairs. If you do have throw rugs, attach them to the floor with carpet tape.  Make sure that you have a light switch at the top of the stairs and the bottom of the stairs. If you do not have them, ask someone to add them for you. What else can I do to help prevent falls?  Wear shoes that:  Do not have high heels.  Have rubber bottoms.  Are comfortable and fit you well.  Are closed at the toe. Do not wear sandals.  If you use a stepladder:  Make sure that it is fully opened. Do not climb a closed stepladder.  Make sure that both sides of the stepladder are locked into place.  Ask someone to hold it for you, if possible.  Clearly mark and make  sure that you can see:  Any grab bars or handrails.  First and last steps.  Where the edge of each step is.  Use tools that help you move around (mobility aids) if they are needed. These include:  Canes.  Walkers.  Scooters.  Crutches.  Turn on the lights when you go into a dark area. Replace any light bulbs as soon as they burn out.  Set up your furniture so you have a clear path. Avoid moving your furniture around.  If any of your floors are uneven, fix them.  If there are any pets around you, be aware of where they are.  Review your medicines with your doctor. Some medicines can make you feel dizzy. This can increase your chance of falling. Ask your doctor what other things that you can do to help prevent falls. This information is not intended to replace advice given to you by your health care provider. Make sure you discuss any questions you have with your health care provider. Document Released: 04/11/2009 Document Revised: 11/21/2015 Document Reviewed: 07/20/2014  2017 [employer]  Health Maintenance, Female Introduction Adopting a healthy lifestyle and getting preventive care can go a long way to promote health and wellness. Talk with your health care provider about what schedule of regular examinations is right for you. This is a good chance for you to check in with your provider about disease prevention and staying healthy. In between checkups, there are plenty of things you can do on your own. Experts have done a lot of research about which lifestyle changes and preventive measures are most likely to keep you healthy. Ask your health care provider for more information. Weight and diet Eat a healthy diet  Be sure to include plenty of vegetables, fruits, low-fat dairy products, and lean protein.  Do not eat a lot of foods high in solid fats, added sugars, or salt.  Get regular exercise. This is one of the most important things you can do for your health.  Most  adults should exercise for at least 150 minutes each week. The exercise should increase your heart rate and make you sweat (moderate-intensity exercise).  Most adults should also do strengthening exercises at least twice a week. This is in addition to the moderate-intensity exercise. Maintain a healthy weight  Body mass index (BMI) is a measurement that can be used to identify possible weight problems. It estimates body fat based on height and weight. Your health care provider can help determine your BMI and help you achieve or maintain a healthy weight.  For  females 3 years of age and older:  A BMI below 18.5 is considered underweight.  A BMI of 18.5 to 24.9 is normal.  A BMI of 25 to 29.9 is considered overweight.  A BMI of 30 and above is considered obese. Watch levels of cholesterol and blood lipids  You should start having your blood tested for lipids and cholesterol at 80 years of age, then have this test every 5 years.  You may need to have your cholesterol levels checked more often if:  Your lipid or cholesterol levels are high.  You are older than 80 years of age.  You are at high risk for heart disease. Cancer screening Lung Cancer  Lung cancer screening is recommended for adults 38-80 years old who are at high risk for lung cancer because of a history of smoking.  A yearly low-dose CT scan of the lungs is recommended for people who:  Currently smoke.  Have quit within the past 15 years.  Have at least a 30-pack-year history of smoking. A pack year is smoking an average of one pack of cigarettes a day for 1 year.  Yearly screening should continue until it has been 15 years since you quit.  Yearly screening should stop if you develop a health problem that would prevent you from having lung cancer treatment. Breast Cancer  Practice breast self-awareness. This means understanding how your breasts normally appear and feel.  It also means doing regular breast  self-exams. Let your health care provider know about any changes, no matter how small.  If you are in your 20s or 30s, you should have a clinical breast exam (CBE) by a health care provider every 1-3 years as part of a regular health exam.  If you are 40 or older, have a CBE every year. Also consider having a breast X-ray (mammogram) every year.  If you have a family history of breast cancer, talk to your health care provider about genetic screening.  If you are at high risk for breast cancer, talk to your health care provider about having an MRI and a mammogram every year.  Breast cancer gene (BRCA) assessment is recommended for women who have family members with BRCA-related cancers. BRCA-related cancers include:  Breast.  Ovarian.  Tubal.  Peritoneal cancers.  Results of the assessment will determine the need for genetic counseling and BRCA1 and BRCA2 testing. Cervical Cancer  Your health care provider may recommend that you be screened regularly for cancer of the pelvic organs (ovaries, uterus, and vagina). This screening involves a pelvic examination, including checking for microscopic changes to the surface of your cervix (Pap test). You may be encouraged to have this screening done every 3 years, beginning at age 75.  For women ages 69-65, health care providers may recommend pelvic exams and Pap testing every 3 years, or they may recommend the Pap and pelvic exam, combined with testing for human papilloma virus (HPV), every 5 years. Some types of HPV increase your risk of cervical cancer. Testing for HPV may also be done on women of any age with unclear Pap test results.  Other health care providers may not recommend any screening for nonpregnant women who are considered low risk for pelvic cancer and who do not have symptoms. Ask your health care provider if a screening pelvic exam is right for you.  If you have had past treatment for cervical cancer or a condition that could lead  to cancer, you need Pap tests and screening for  cancer for at least 20 years after your treatment. If Pap tests have been discontinued, your risk factors (such as having a new sexual partner) need to be reassessed to determine if screening should resume. Some women have medical problems that increase the chance of getting cervical cancer. In these cases, your health care provider may recommend more frequent screening and Pap tests. Colorectal Cancer  This type of cancer can be detected and often prevented.  Routine colorectal cancer screening usually begins at 80 years of age and continues through 80 years of age.  Your health care provider may recommend screening at an earlier age if you have risk factors for colon cancer.  Your health care provider may also recommend using home test kits to check for hidden blood in the stool.  A small camera at the end of a tube can be used to examine your colon directly (sigmoidoscopy or colonoscopy). This is done to check for the earliest forms of colorectal cancer.  Routine screening usually begins at age 24.  Direct examination of the colon should be repeated every 5-10 years through 80 years of age. However, you may need to be screened more often if early forms of precancerous polyps or small growths are found. Skin Cancer  Check your skin from head to toe regularly.  Tell your health care provider about any new moles or changes in moles, especially if there is a change in a mole's shape or color.  Also tell your health care provider if you have a mole that is larger than the size of a pencil eraser.  Always use sunscreen. Apply sunscreen liberally and repeatedly throughout the day.  Protect yourself by wearing long sleeves, pants, a wide-brimmed hat, and sunglasses whenever you are outside. Heart disease, diabetes, and high blood pressure  High blood pressure causes heart disease and increases the risk of stroke. High blood pressure is more  likely to develop in:  People who have blood pressure in the high end of the normal range (130-139/85-89 mm Hg).  People who are overweight or obese.  People who are African American.  If you are 23-4 years of age, have your blood pressure checked every 3-5 years. If you are 23 years of age or older, have your blood pressure checked every year. You should have your blood pressure measured twice-once when you are at a hospital or clinic, and once when you are not at a hospital or clinic. Record the average of the two measurements. To check your blood pressure when you are not at a hospital or clinic, you can use:  An automated blood pressure machine at a pharmacy.  A home blood pressure monitor.  If you are between 61 years and 64 years old, ask your health care provider if you should take aspirin to prevent strokes.  Have regular diabetes screenings. This involves taking a blood sample to check your fasting blood sugar level.  If you are at a normal weight and have a low risk for diabetes, have this test once every three years after 80 years of age.  If you are overweight and have a high risk for diabetes, consider being tested at a younger age or more often. Preventing infection Hepatitis B  If you have a higher risk for hepatitis B, you should be screened for this virus. You are considered at high risk for hepatitis B if:  You were born in a country where hepatitis B is common. Ask your health care provider which countries  are considered high risk.  Your parents were born in a high-risk country, and you have not been immunized against hepatitis B (hepatitis B vaccine).  You have HIV or AIDS.  You use needles to inject street drugs.  You live with someone who has hepatitis B.  You have had sex with someone who has hepatitis B.  You get hemodialysis treatment.  You take certain medicines for conditions, including cancer, organ transplantation, and autoimmune  conditions. Hepatitis C  Blood testing is recommended for:  Everyone born from [birth year] through 1963.  Anyone with known risk factors for hepatitis C. Sexually transmitted infections (STIs)  You should be screened for sexually transmitted infections (STIs) including gonorrhea and chlamydia if:  You are sexually active and are younger than 80 years of age.  You are older than 80 years of age and your health care provider tells you that you are at risk for this type of infection.  Your sexual activity has changed since you were last screened and you are at an increased risk for chlamydia or gonorrhea. Ask your health care provider if you are at risk.  If you do not have HIV, but are at risk, it may be recommended that you take a prescription medicine daily to prevent HIV infection. This is called pre-exposure prophylaxis (PrEP). You are considered at risk if:  You are sexually active and do not regularly use condoms or know the HIV status of your partner(s).  You take drugs by injection.  You are sexually active with a partner who has HIV. Talk with your health care provider about whether you are at high risk of being infected with HIV. If you choose to begin PrEP, you should first be tested for HIV. You should then be tested every 3 months for as long as you are taking PrEP. Pregnancy  If you are premenopausal and you may become pregnant, ask your health care provider about preconception counseling.  If you may become pregnant, take 400 to 800 micrograms (mcg) of folic acid every day.  If you want to prevent pregnancy, talk to your health care provider about birth control (contraception). Osteoporosis and menopause  Osteoporosis is a disease in which the bones lose minerals and strength with aging. This can result in serious bone fractures. Your risk for osteoporosis can be identified using a bone density scan.  If you are 76 years of age or older, or if you are at risk for  osteoporosis and fractures, ask your health care provider if you should be screened.  Ask your health care provider whether you should take a calcium or vitamin D supplement to lower your risk for osteoporosis.  Menopause may have certain physical symptoms and risks.  Hormone replacement therapy may reduce some of these symptoms and risks. Talk to your health care provider about whether hormone replacement therapy is right for you. Follow these instructions at home:  Schedule regular health, dental, and eye exams.  Stay current with your immunizations.  Do not use any tobacco products including cigarettes, chewing tobacco, or electronic cigarettes.  If you are pregnant, do not drink alcohol.  If you are breastfeeding, limit how much and how often you drink alcohol.  Limit alcohol intake to no more than 1 drink per day for nonpregnant women. One drink equals 12 ounces of beer, 5 ounces of wine, or 1 ounces of hard liquor.  Do not use street drugs.  Do not share needles.  Ask your health care provider for help  if you need support or information about quitting drugs.  Tell your health care provider if you often feel depressed.  Tell your health care provider if you have ever been abused or do not feel safe at home. This information is not intended to replace advice given to you by your health care provider. Make sure you discuss any questions you have with your health care provider. Document Released: 12/29/2010 Document Revised: 11/21/2015 Document Reviewed: 03/19/2015  2017 [employer]

## 2016-06-16 ENCOUNTER — Encounter: Payer: Self-pay | Admitting: Internal Medicine

## 2016-06-16 ENCOUNTER — Ambulatory Visit (INDEPENDENT_AMBULATORY_CARE_PROVIDER_SITE_OTHER): Payer: Commercial Managed Care - HMO | Admitting: Internal Medicine

## 2016-06-16 ENCOUNTER — Other Ambulatory Visit (INDEPENDENT_AMBULATORY_CARE_PROVIDER_SITE_OTHER): Payer: Commercial Managed Care - HMO

## 2016-06-16 VITALS — BP 138/84 | HR 68 | Temp 98.1°F | Ht 59.0 in | Wt 181.2 lb

## 2016-06-16 DIAGNOSIS — D508 Other iron deficiency anemias: Secondary | ICD-10-CM | POA: Diagnosis not present

## 2016-06-16 DIAGNOSIS — I10 Essential (primary) hypertension: Secondary | ICD-10-CM | POA: Diagnosis not present

## 2016-06-16 DIAGNOSIS — E118 Type 2 diabetes mellitus with unspecified complications: Secondary | ICD-10-CM

## 2016-06-16 LAB — CBC WITH DIFFERENTIAL/PLATELET
BASOS PCT: 0.8 % (ref 0.0–3.0)
Basophils Absolute: 0 10*3/uL (ref 0.0–0.1)
EOS ABS: 0.1 10*3/uL (ref 0.0–0.7)
EOS PCT: 2.3 % (ref 0.0–5.0)
HEMATOCRIT: 39.3 % (ref 36.0–46.0)
HEMOGLOBIN: 12.7 g/dL (ref 12.0–15.0)
LYMPHS PCT: 35.8 % (ref 12.0–46.0)
Lymphs Abs: 1.6 10*3/uL (ref 0.7–4.0)
MCHC: 32.4 g/dL (ref 30.0–36.0)
MCV: 82.7 fl (ref 78.0–100.0)
MONO ABS: 0.4 10*3/uL (ref 0.1–1.0)
Monocytes Relative: 7.9 % (ref 3.0–12.0)
NEUTROS ABS: 2.4 10*3/uL (ref 1.4–7.7)
Neutrophils Relative %: 53.2 % (ref 43.0–77.0)
PLATELETS: 435 10*3/uL — AB (ref 150.0–400.0)
RBC: 4.75 Mil/uL (ref 3.87–5.11)
RDW: 18 % — AB (ref 11.5–15.5)
WBC: 4.6 10*3/uL (ref 4.0–10.5)

## 2016-06-16 LAB — BASIC METABOLIC PANEL
BUN: 11 mg/dL (ref 6–23)
CHLORIDE: 105 meq/L (ref 96–112)
CO2: 26 meq/L (ref 19–32)
CREATININE: 0.95 mg/dL (ref 0.40–1.20)
Calcium: 9.7 mg/dL (ref 8.4–10.5)
GFR: 71.79 mL/min (ref >60.00)
Glucose, Bld: 107 mg/dL — ABNORMAL HIGH (ref 70–99)
POTASSIUM: 3.6 meq/L (ref 3.5–5.1)
Sodium: 140 mEq/L (ref 135–145)

## 2016-06-16 LAB — HEMOGLOBIN A1C: Hgb A1c MFr Bld: 6.2 % (ref 4.6–6.5)

## 2016-06-16 MED ORDER — POTASSIUM CHLORIDE CRYS ER 20 MEQ PO TBCR
20.0000 meq | EXTENDED_RELEASE_TABLET | Freq: Three times a day (TID) | ORAL | 1 refills | Status: DC
Start: 2016-06-16 — End: 2017-02-10

## 2016-06-16 NOTE — Patient Instructions (Signed)
Hypertension Hypertension, commonly called high blood pressure, is when the force of blood pumping through your arteries is too strong. Your arteries are the blood vessels that carry blood from your heart throughout your body. A blood pressure reading consists of a higher number over a lower number, such as 110/72. The higher number (systolic) is the pressure inside your arteries when your heart pumps. The lower number (diastolic) is the pressure inside your arteries when your heart relaxes. Ideally you want your blood pressure below 120/80. Hypertension forces your heart to work harder to pump blood. Your arteries may become narrow or stiff. Having untreated or uncontrolled hypertension can cause heart attack, stroke, kidney disease, and other problems. What increases the risk? Some risk factors for high blood pressure are controllable. Others are not. Risk factors you cannot control include:  Race. You may be at higher risk if you are African American.  Age. Risk increases with age.  Gender. Men are at higher risk than women before age 45 years. After age 65, women are at higher risk than men. Risk factors you can control include:  Not getting enough exercise or physical activity.  Being overweight.  Getting too much fat, sugar, calories, or salt in your diet.  Drinking too much alcohol. What are the signs or symptoms? Hypertension does not usually cause signs or symptoms. Extremely high blood pressure (hypertensive crisis) may cause headache, anxiety, shortness of breath, and nosebleed. How is this diagnosed? To check if you have hypertension, your health care provider will measure your blood pressure while you are seated, with your arm held at the level of your heart. It should be measured at least twice using the same arm. Certain conditions can cause a difference in blood pressure between your right and left arms. A blood pressure reading that is higher than normal on one occasion does  not mean that you need treatment. If it is not clear whether you have high blood pressure, you may be asked to return on a different day to have your blood pressure checked again. Or, you may be asked to monitor your blood pressure at home for 1 or more weeks. How is this treated? Treating high blood pressure includes making lifestyle changes and possibly taking medicine. Living a healthy lifestyle can help lower high blood pressure. You may need to change some of your habits. Lifestyle changes may include:  Following the DASH diet. This diet is high in fruits, vegetables, and whole grains. It is low in salt, red meat, and added sugars.  Keep your sodium intake below 2,300 mg per day.  Getting at least 30-45 minutes of aerobic exercise at least 4 times per week.  Losing weight if necessary.  Not smoking.  Limiting alcoholic beverages.  Learning ways to reduce stress. Your health care provider may prescribe medicine if lifestyle changes are not enough to get your blood pressure under control, and if one of the following is true:  You are 18-59 years of age and your systolic blood pressure is above 140.  You are 60 years of age or older, and your systolic blood pressure is above 150.  Your diastolic blood pressure is above 90.  You have diabetes, and your systolic blood pressure is over 140 or your diastolic blood pressure is over 90.  You have kidney disease and your blood pressure is above 140/90.  You have heart disease and your blood pressure is above 140/90. Your personal target blood pressure may vary depending on your medical   conditions, your age, and other factors. Follow these instructions at home:  Have your blood pressure rechecked as directed by your health care provider.  Take medicines only as directed by your health care provider. Follow the directions carefully. Blood pressure medicines must be taken as prescribed. The medicine does not work as well when you skip  doses. Skipping doses also puts you at risk for problems.  Do not smoke.  Monitor your blood pressure at home as directed by your health care provider. Contact a health care provider if:  You think you are having a reaction to medicines taken.  You have recurrent headaches or feel dizzy.  You have swelling in your ankles.  You have trouble with your vision. Get help right away if:  You develop a severe headache or confusion.  You have unusual weakness, numbness, or feel faint.  You have severe chest or abdominal pain.  You vomit repeatedly.  You have trouble breathing. This information is not intended to replace advice given to you by your health care provider. Make sure you discuss any questions you have with your health care provider. Document Released: 06/15/2005 Document Revised: 11/21/2015 Document Reviewed: 04/07/2013 [employer] Interactive Patient Education  2017 [employer]  

## 2016-06-16 NOTE — Progress Notes (Signed)
Pre visit review using our clinic review tool, if applicable. No additional management support is needed unless otherwise documented below in the visit note. 

## 2016-06-16 NOTE — Progress Notes (Signed)
Subjective:  Patient ID: Brittany [name], female    DOB: 23-Oct-1928  Age: 82 y.o. MRN: [id number]  CC: Anemia; Hypertension; and Diabetes   HPI [name] presents for f/up - she feels well and offers no complaints. She tells me that her BP as been well controlled.  Outpatient Medications Prior to Visit  Medication Sig Dispense Refill  . aspirin EC 81 MG EC tablet Take 81 mg by mouth every other day.     Marland Kitchen atorvastatin (LIPITOR) 20 MG tablet take 1 tablet by mouth once daily 90 tablet 1  . Cholecalciferol 1000 UNIT tablet Take 1,000 Units by mouth daily.      . cloNIDine (CATAPRES) 0.1 MG tablet Take 1 tablet (0.1 mg total) by mouth 3 (three) times daily. 90 tablet 11  . diltiazem (CARDIZEM SR) 60 MG 12 hr capsule Take 1 capsule (60 mg total) by mouth 2 (two) times daily. 60 capsule 5  . Doxepin HCl (SILENOR) 6 MG TABS Take 1 tablet (6 mg total) by mouth at bedtime as needed. 90 tablet 1  . ferrous sulfate 325 (65 FE) MG tablet Take 1 tablet (325 mg total) by mouth 2 (two) times daily with a meal. 180 tablet 3  . Glucosamine 500 MG TABS Take 500 mg by mouth daily.     . hydrochlorothiazide (HYDRODIURIL) 25 MG tablet Take 1 tablet (25 mg total) by mouth daily. 90 tablet 1  . metFORMIN (GLUCOPHAGE) 500 MG tablet Take 1 tablet (500 mg total) by mouth daily with breakfast. 90 tablet 3  . nebivolol (BYSTOLIC) 10 MG tablet Take 1 tablet (10 mg total) by mouth daily. 30 tablet 11  . potassium chloride SA (K-DUR,KLOR-CON) 20 MEQ tablet Take 1 tablet (20 mEq total) by mouth 3 (three) times daily. 270 tablet 1   No facility-administered medications prior to visit.     ROS Review of Systems  Constitutional: Negative for diaphoresis and fatigue.  HENT: Negative.   Eyes: Negative.  Negative for visual disturbance.  Respiratory: Negative for cough, chest tightness, shortness of breath, wheezing and stridor.   Cardiovascular: Negative for chest pain, palpitations and leg swelling.    Gastrointestinal: Negative.  Negative for abdominal pain, constipation, diarrhea and nausea.  Genitourinary: Negative.   Musculoskeletal: Negative.   Skin: Negative.   Allergic/Immunologic: Negative.   Neurological: Negative for dizziness, weakness, numbness and headaches.  Hematological: Negative.  Negative for adenopathy. Does not bruise/bleed easily.  Psychiatric/Behavioral: Negative.     Objective:  BP 138/84 (BP Location: Left Arm, Patient Position: Sitting, Cuff Size: Normal)   Pulse 68   Temp 98.1 F (36.7 C) (Oral)   Ht 4\' 11"  (1.499 m)   Wt 181 lb 4 oz (82.2 kg)   SpO2 98%   BMI 36.61 kg/m   BP Readings from Last 3 Encounters:  06/16/16 138/84  06/04/16 (!) 148/80  04/15/16 (!) 200/118    Wt Readings from Last 3 Encounters:  06/16/16 181 lb 4 oz (82.2 kg)  06/04/16 182 lb 0.6 oz (82.6 kg)  04/15/16 187 lb 4 oz (84.9 kg)    Physical Exam  Constitutional: She is oriented to person, place, and time. No distress.  HENT:  Mouth/Throat: Oropharynx is clear and moist. No oropharyngeal exudate.  Eyes: Conjunctivae are normal. Right eye exhibits no discharge. Left eye exhibits no discharge. No scleral icterus.  Neck: Normal range of motion. Neck supple. No JVD present. No tracheal deviation present. No thyromegaly present.  Cardiovascular: Normal rate, regular  rhythm, normal heart sounds and intact distal pulses.  Exam reveals no gallop and no friction rub.   No murmur heard. Pulmonary/Chest: Effort normal and breath sounds normal. No stridor. No respiratory distress. She has no wheezes. She has no rales. She exhibits no tenderness.  Abdominal: Soft. Bowel sounds are normal. She exhibits no distension and no mass. There is no tenderness. There is no rebound and no guarding.  Musculoskeletal: Normal range of motion. She exhibits no edema, tenderness or deformity.  Lymphadenopathy:    She has no cervical adenopathy.  Neurological: She is oriented to person, place, and  time.  Skin: Skin is warm and dry. No rash noted. She is not diaphoretic. No erythema. No pallor.  Vitals reviewed.   Lab Results  Component Value Date   WBC 4.6 06/16/2016   HGB 12.7 06/16/2016   HCT 39.3 06/16/2016   PLT 435.0 (H) 06/16/2016   GLUCOSE 107 (H) 06/16/2016   CHOL 160 04/15/2016   TRIG 50.0 04/15/2016   HDL 79.50 04/15/2016   LDLDIRECT 119.8 04/02/2008   LDLCALC 70 04/15/2016   ALT 15 10/04/2013   AST 18 10/04/2013   NA 140 06/16/2016   K 3.6 06/16/2016   CL 105 06/16/2016   CREATININE 0.95 06/16/2016   BUN 11 06/16/2016   CO2 26 06/16/2016   TSH 2.10 04/15/2016   HGBA1C 6.2 06/16/2016   MICROALBUR <0.7 09/23/2015    Mm Diag Breast Tomo Bilateral  Result Date: 11/07/2015 CLINICAL DATA:  Annual diagnostic mammogram. Patient underwent right breast lumpectomy in April 2010. This will complete 7 years of diagnostic surveillance. EXAM: 2D DIGITAL DIAGNOSTIC BILATERAL MAMMOGRAM WITH CAD AND ADJUNCT TOMO COMPARISON:  Previous exam(s). ACR Breast Density Category a: The breast tissue is almost entirely fatty. FINDINGS: Architectural distortion reflecting postsurgical scarring on the right is stable. There are no discrete masses or other areas of architectural distortion. There are no suspicious calcifications. Mammographic images were processed with CAD. IMPRESSION: No evidence of recurrent or new breast malignancy. Benign postsurgical changes on the right. RECOMMENDATION: Screening mammogram in one year.(Code:SM-B-01Y) I have discussed the findings and recommendations with the patient. Results were also provided in writing at the conclusion of the visit. If applicable, a reminder letter will be sent to the patient regarding the next appointment. BI-RADS CATEGORY  2: Benign. Electronically Signed   By: Lajean Manes M.D.   On: 11/07/2015 08:16    Assessment & Plan:   [name] was seen today for anemia, hypertension and diabetes.  Diagnoses and all orders for this  visit:  Other iron deficiency anemia- improvement noted -     CBC with Differential/Platelet; Future  Essential hypertension, benign- her blood pressure is well-controlled, electrolytes and renal function are stable. -     Basic metabolic panel; Future -     potassium chloride SA (K-DUR,KLOR-CON) 20 MEQ tablet; Take 1 tablet (20 mEq total) by mouth 3 (three) times daily.  Type 2 diabetes mellitus with complication, without long-term current use of insulin ([place])- her A1c is at 6.2%, her blood sugars are adequately well controlled. -     Basic metabolic panel; Future -     Hemoglobin A1c; Future   I am having Ms. Bethard maintain her aspirin EC, Cholecalciferol, Glucosamine, diltiazem, cloNIDine, metFORMIN, atorvastatin, nebivolol, hydrochlorothiazide, Doxepin HCl, ferrous sulfate, and potassium chloride SA.  Meds ordered this encounter  Medications  . potassium chloride SA (K-DUR,KLOR-CON) 20 MEQ tablet    Sig: Take 1 tablet (20 mEq total) by mouth  3 (three) times daily.    Dispense:  270 tablet    Refill:  1     Follow-up: Return in about 6 months (around 12/15/2016).  Scarlette Calico, MD

## 2016-07-02 ENCOUNTER — Other Ambulatory Visit: Payer: Self-pay | Admitting: Internal Medicine

## 2016-07-02 NOTE — Addendum Note (Signed)
Addended by: Aviva Signs M on: 07/02/2016 09:11 AM   Modules accepted: Orders

## 2016-07-08 ENCOUNTER — Other Ambulatory Visit: Payer: Self-pay | Admitting: Family Medicine

## 2016-07-08 DIAGNOSIS — Z1231 Encounter for screening mammogram for malignant neoplasm of breast: Secondary | ICD-10-CM

## 2016-08-04 ENCOUNTER — Ambulatory Visit
Admission: RE | Admit: 2016-08-04 | Discharge: 2016-08-04 | Disposition: A | Discharge status: Home / Self Care | Payer: Medicare HMO | Source: Ambulatory Visit | Attending: Internal Medicine | Admitting: Internal Medicine

## 2016-08-04 DIAGNOSIS — Z780 Asymptomatic menopausal state: Secondary | ICD-10-CM | POA: Diagnosis not present

## 2016-08-04 DIAGNOSIS — E2839 Other primary ovarian failure: Secondary | ICD-10-CM

## 2016-08-04 DIAGNOSIS — M8589 Other specified disorders of bone density and structure, multiple sites: Secondary | ICD-10-CM | POA: Diagnosis not present

## 2016-08-04 LAB — HM DEXA SCAN: HM Dexa Scan: -2.4

## 2016-08-05 ENCOUNTER — Telehealth: Payer: Self-pay | Admitting: Internal Medicine

## 2016-08-05 NOTE — Telephone Encounter (Signed)
Patient called.  Gave MD response on Dexa.  Patient does not want to treat osteoporosis right now.

## 2016-10-19 ENCOUNTER — Other Ambulatory Visit: Payer: Self-pay | Admitting: Internal Medicine

## 2016-10-19 DIAGNOSIS — I10 Essential (primary) hypertension: Secondary | ICD-10-CM

## 2016-10-19 DIAGNOSIS — E118 Type 2 diabetes mellitus with unspecified complications: Secondary | ICD-10-CM

## 2016-10-29 ENCOUNTER — Ambulatory Visit (INDEPENDENT_AMBULATORY_CARE_PROVIDER_SITE_OTHER): Payer: Medicare HMO | Admitting: Internal Medicine

## 2016-10-29 ENCOUNTER — Other Ambulatory Visit (INDEPENDENT_AMBULATORY_CARE_PROVIDER_SITE_OTHER): Payer: Medicare HMO

## 2016-10-29 VITALS — BP 130/80 | HR 66 | Temp 98.3°F | Resp 16 | Ht 59.0 in | Wt 182.0 lb

## 2016-10-29 DIAGNOSIS — E118 Type 2 diabetes mellitus with unspecified complications: Secondary | ICD-10-CM

## 2016-10-29 DIAGNOSIS — D508 Other iron deficiency anemias: Secondary | ICD-10-CM

## 2016-10-29 DIAGNOSIS — R21 Rash and other nonspecific skin eruption: Secondary | ICD-10-CM

## 2016-10-29 DIAGNOSIS — I10 Essential (primary) hypertension: Secondary | ICD-10-CM

## 2016-10-29 LAB — BASIC METABOLIC PANEL
BUN: 15 mg/dL (ref 6–23)
CO2: 30 mEq/L (ref 19–32)
Calcium: 9.9 mg/dL (ref 8.4–10.5)
Chloride: 104 mEq/L (ref 96–112)
Creatinine, Ser: 0.87 mg/dL (ref 0.40–1.20)
GFR: 79.39 mL/min (ref >60.00)
Glucose, Bld: 103 mg/dL — ABNORMAL HIGH (ref 70–99)
Potassium: 3.9 mEq/L (ref 3.5–5.1)
SODIUM: 140 meq/L (ref 135–145)

## 2016-10-29 LAB — MICROALBUMIN / CREATININE URINE RATIO
Creatinine,U: 115.1 mg/dL
MICROALB UR: 0.8 mg/dL (ref 0.0–1.9)
MICROALB/CREAT RATIO: 0.7 mg/g (ref 0.0–30.0)

## 2016-10-29 LAB — CBC WITH DIFFERENTIAL/PLATELET
BASOS PCT: 0.6 % (ref 0.0–3.0)
Basophils Absolute: 0 10*3/uL (ref 0.0–0.1)
EOS ABS: 0.1 10*3/uL (ref 0.0–0.7)
Eosinophils Relative: 2.5 % (ref 0.0–5.0)
HEMATOCRIT: 41.8 % (ref 36.0–46.0)
Hemoglobin: 13.8 g/dL (ref 12.0–15.0)
LYMPHS ABS: 1.7 10*3/uL (ref 0.7–4.0)
Lymphocytes Relative: 29.5 % (ref 12.0–46.0)
MCHC: 33.1 g/dL (ref 30.0–36.0)
MCV: 87.8 fl (ref 78.0–100.0)
MONO ABS: 0.5 10*3/uL (ref 0.1–1.0)
Monocytes Relative: 8.6 % (ref 3.0–12.0)
NEUTROS ABS: 3.4 10*3/uL (ref 1.4–7.7)
NEUTROS PCT: 58.8 % (ref 43.0–77.0)
PLATELETS: 343 10*3/uL (ref 150.0–400.0)
RBC: 4.76 Mil/uL (ref 3.87–5.11)
RDW: 14.3 % (ref 11.5–15.5)
WBC: 5.8 10*3/uL (ref 4.0–10.5)

## 2016-10-29 LAB — HEMOGLOBIN A1C: HEMOGLOBIN A1C: 6.3 % (ref 4.6–6.5)

## 2016-10-29 NOTE — Progress Notes (Signed)
Pre visit review using our clinic review tool, if applicable. No additional management support is needed unless otherwise documented below in the visit note. 

## 2016-10-29 NOTE — Patient Instructions (Signed)

## 2016-10-29 NOTE — Progress Notes (Signed)
Subjective:  Patient ID: Brittany Brittany [name], female    DOB: 05/16/1929  Age: 83 y.o. MRN: [id number]  CC: Hypertension; Anemia; and Diabetes   HPI Brittany Brittany [name] presents for Brittany [name]/up - She complains of a rash on her face and wants to see a dermatologist. She describes the rash as asymptomatic dark spots on her cheeks. She covers it up with makeup. She otherwise feels well and offers no other complaints. She tells me her blood pressure has been well controlled.  Outpatient Medications Prior to Visit  Medication Sig Dispense Refill  . aspirin EC 81 MG EC tablet Take 81 mg by mouth every other day.     Marland Kitchen atorvastatin (LIPITOR) 20 MG tablet take 1 tablet by mouth once daily 90 tablet 1  . Cholecalciferol 1000 UNIT tablet Take 1,000 Units by mouth daily.      . cloNIDine (CATAPRES) 0.1 MG tablet Take 1 tablet (0.1 mg total) by mouth 3 (three) times daily. 90 tablet 11  . diltiazem (CARDIZEM SR) 60 MG 12 hr capsule take 1 capsule by mouth twice a day 90 capsule 1  . Doxepin HCl (SILENOR) 6 MG TABS Take 1 tablet (6 mg total) by mouth at bedtime as needed. 90 tablet 1  . ferrous sulfate 325 (65 FE) MG tablet Take 1 tablet (325 mg total) by mouth 2 (two) times daily with a meal. 180 tablet 3  . Glucosamine 500 MG TABS Take 500 mg by mouth daily.     . hydrochlorothiazide (HYDRODIURIL) 25 MG tablet take 1 tablet by mouth once daily 90 tablet 1  . metFORMIN (GLUCOPHAGE) 500 MG tablet Take 1 tablet (500 mg total) by mouth daily with breakfast. 90 tablet 3  . nebivolol (BYSTOLIC) 10 MG tablet Take 1 tablet (10 mg total) by mouth daily. 30 tablet 11  . potassium chloride SA (K-DUR,KLOR-CON) 20 MEQ tablet Take 1 tablet (20 mEq total) by mouth 3 (three) times daily. 270 tablet 1   No facility-administered medications prior to visit.     ROS Review of Systems  Constitutional: Negative for chills, diaphoresis, fatigue and fever.  HENT: Negative.  Negative for sinus pressure, sore throat and trouble  swallowing.   Eyes: Negative.  Negative for visual disturbance.  Respiratory: Negative for cough, chest tightness, shortness of breath and wheezing.   Cardiovascular: Negative for chest pain, palpitations and leg swelling.  Gastrointestinal: Negative for abdominal pain, constipation, diarrhea, nausea and vomiting.  Endocrine: Negative.   Genitourinary: Negative.  Negative for difficulty urinating.  Musculoskeletal: Negative.  Negative for back pain and myalgias.  Skin: Positive for rash. Negative for color change, pallor and wound.  Allergic/Immunologic: Negative.   Neurological: Negative.  Negative for dizziness and light-headedness.  Hematological: Negative for adenopathy. Does not bruise/bleed easily.  Psychiatric/Behavioral: Negative.     Objective:  BP 130/80 (BP Location: Left Arm, Patient Position: Sitting, Cuff Size: Normal)   Pulse 66   Temp 98.3 Brittany [name] (36.8 C) (Oral)   Resp 16   Ht 4\' 11"  (1.499 m)   Wt 182 lb (82.6 kg)   SpO2 99%   BMI 36.76 kg/m   BP Readings from Last 3 Encounters:  10/29/16 130/80  06/16/16 138/84  06/04/16 (!) 148/80    Wt Readings from Last 3 Encounters:  10/29/16 182 lb (82.6 kg)  06/16/16 181 lb 4 oz (82.2 kg)  06/04/16 182 lb 0.6 oz (82.6 kg)    Physical Exam  Constitutional: She is oriented to person, place, and time.  No distress.  HENT:  Mouth/Throat: Oropharynx is clear and moist. No oropharyngeal exudate.  Eyes: Conjunctivae are normal. Right eye exhibits no discharge. Left eye exhibits no discharge. No scleral icterus.  Neck: Normal range of motion. Neck supple. No JVD present. No tracheal deviation present. No thyromegaly present.  Cardiovascular: Normal rate, regular rhythm, normal heart sounds and intact distal pulses.  Exam reveals no gallop and no friction rub.   No murmur heard. Pulmonary/Chest: Effort normal and breath sounds normal. No stridor. No respiratory distress. She has no wheezes. She has no rales. She exhibits no  tenderness.  Abdominal: Soft. Bowel sounds are normal. She exhibits no distension and no mass. There is no tenderness. There is no rebound and no guarding.  Musculoskeletal: Normal range of motion. She exhibits no edema, tenderness or deformity.  Lymphadenopathy:    She has no cervical adenopathy.  Neurological: She is oriented to person, place, and time.  Skin: Skin is warm and dry. Rash noted. No purpura noted. Rash is macular. Rash is not papular, not nodular, not pustular, not vesicular and not urticarial. She is not diaphoretic. No erythema. No pallor.  Over the malar surfaces there are large hyperpigmented macules that are covered with makeup  Vitals reviewed.   Lab Results  Component Value Date   WBC 5.8 10/29/2016   HGB 13.8 10/29/2016   HCT 41.8 10/29/2016   PLT 343.0 10/29/2016   GLUCOSE 103 (H) 10/29/2016   CHOL 160 04/15/2016   TRIG 50.0 04/15/2016   HDL 79.50 04/15/2016   LDLDIRECT 119.8 04/02/2008   LDLCALC 70 04/15/2016   ALT 15 10/04/2013   AST 18 10/04/2013   NA 140 10/29/2016   K 3.9 10/29/2016   CL 104 10/29/2016   CREATININE 0.87 10/29/2016   BUN 15 10/29/2016   CO2 30 10/29/2016   TSH 2.10 04/15/2016   HGBA1C 6.3 10/29/2016   MICROALBUR 0.8 10/29/2016    Dg Bone Density  Result Date: 08/04/2016 EXAM: DUAL X-RAY ABSORPTIOMETRY (DXA) FOR BONE MINERAL DENSITY IMPRESSION: Referring Physician:  Scarlette [name] PATIENT: Name: Brittany Brittany [name], Brittany Brittany [name] Patient ID: [id number] Birth Date: 03-07-1929 Height: 57.5 in. Sex: Female Measured: 08/04/2016 Weight: 183.3 lbs. Indications: Advanced Age, Breast Cancer History, Estrogen Deficient, Height Loss (781.91), Postmenopausal Fractures: None Treatments: Calcium (E943.0), Vitamin D (E933.5) ASSESSMENT: The BMD measured at Femur Neck Left is 0.707 g/cm2 with a T-score of -2.4. This patient is considered osteopenic according to [facility]) criteria. L-2 was excluded due to being previously excluded in prior scan.  There has been no statistically significant change in BMD of Left hip since prior exam dated 04/07/2013. Site Region Measured Date Measured Age YA BMD Significant CHANGE T-score DualFemur Neck Left  08/04/2016    85.7         -2.4    0.707 g/cm2 AP Spine  L1-L4 (L2) 08/04/2016    85.7         -1.5    0.997 g/cm2 World Health Organization [facility]) criteria for post-menopausal, Caucasian Women: Normal       T-score at or above -1 SD Osteopenia   T-score between -1 and -2.5 SD Osteoporosis T-score at or below -2.5 SD RECOMMENDATION: [facility] recommends that FDA-approved medical therapies be considered in postmenopausal women and men age 74 or older with a: 1. Hip or vertebral (clinical or morphometric) fracture. 2. T-score of <-2.5 at the spine or hip. 3. Ten-year fracture probability by FRAX of 3% or greater for hip fracture or  20% or greater for major osteoporotic fracture. All treatment decisions require clinical judgment and consideration of individual patient factors, including patient preferences, co-morbidities, previous drug use, risk factors not captured in the FRAX model (e.g. falls, vitamin D deficiency, increased bone turnover, interval significant decline in bone density) and possible under - or over-estimation of fracture risk by FRAX. All patients should ensure an adequate intake of dietary calcium (1200 mg/d) and vitamin D (800 IU daily) unless contraindicated. FOLLOW-UP: People with diagnosed cases of osteoporosis or at high risk for fracture should have regular bone mineral density tests. For patients eligible for Medicare, routine testing is allowed once every 2 years. The testing frequency can be increased to one year for patients who have rapidly progressing disease, those who are receiving or discontinuing medical therapy to restore bone mass, or have additional risk factors. I have reviewed this report, and agree with the above findings. [facility] FRAX* 10-year  Probability of Fracture Based on femoral neck BMD: DualFemur (Left) Major Osteoporotic Fracture: 7.4% Hip Fracture:                2.3% Population:                  [place] ([place]) Risk Factors:                None *FRAX is a Materials engineer of the [employer] for Metabolic Bone Disease, a World Pharmacologist (WHO) [facility]. ASSESSMENT: The probability of a major osteoporotic fracture is 7.4 % within the next ten years. The probability of a hip fracture is 2.3 % within the next ten years. Electronically Signed   By: Marijo Conception, M.D.   On: 08/04/2016 10:22    Assessment & Plan:   Brittany Brittany [name] was seen today for hypertension, anemia and diabetes.  Diagnoses and all orders for this visit:  Type 2 diabetes mellitus with complication, without long-term current use of insulin ([place])- Her A1c is 6.3%, her blood sugars are adequately well-controlled. -     Basic metabolic panel; Future -     Hemoglobin A1c; Future -     Urine Microalbumin w/creat. ratio; Future -     Ambulatory referral to Ophthalmology  Essential hypertension, benign- her blood pressure is well-controlled, electrolytes and renal function are normal. -     Basic metabolic panel; Future  Other iron deficiency anemia- improvement noted, will continue iron replacement therapy. -     CBC with Differential/Platelet; Future  Rash of face -     Ambulatory referral to Dermatology   I am having Ms. Brittany Brittany [name] maintain her aspirin EC, Cholecalciferol, Glucosamine, cloNIDine, metFORMIN, atorvastatin, nebivolol, Doxepin HCl, ferrous sulfate, potassium chloride SA, diltiazem, and hydrochlorothiazide.  No orders of the defined types were placed in this encounter.    Follow-up: Return in about 6 months (around 05/01/2017).  Brittany Calico, MD

## 2016-10-30 ENCOUNTER — Encounter: Payer: Self-pay | Admitting: Internal Medicine

## 2016-10-30 ENCOUNTER — Other Ambulatory Visit: Payer: Self-pay | Admitting: Internal Medicine

## 2016-11-02 ENCOUNTER — Encounter: Payer: Self-pay | Admitting: Internal Medicine

## 2016-11-05 ENCOUNTER — Telehealth: Payer: Self-pay | Admitting: Internal Medicine

## 2016-11-05 NOTE — Telephone Encounter (Signed)
Has this referral been processed and if so will the office contact her directly?

## 2016-11-05 NOTE — Telephone Encounter (Signed)
Pt called and said that she would like for Dr Ronnald Ramp to continue the deferral to dermatology for her. She said that he tried to do it while she was here but he could not get in touch with anyone. She just wanted to make sure that this was done.

## 2016-11-06 NOTE — Telephone Encounter (Signed)
appt has been made and pt is aware 

## 2016-11-12 DIAGNOSIS — L818 Other specified disorders of pigmentation: Secondary | ICD-10-CM | POA: Diagnosis not present

## 2016-12-14 ENCOUNTER — Ambulatory Visit
Admission: RE | Admit: 2016-12-14 | Discharge: 2016-12-14 | Disposition: A | Discharge status: Home / Self Care | Payer: Medicare HMO | Source: Ambulatory Visit | Attending: Family Medicine | Admitting: Family Medicine

## 2016-12-14 DIAGNOSIS — Z1231 Encounter for screening mammogram for malignant neoplasm of breast: Secondary | ICD-10-CM | POA: Diagnosis not present

## 2016-12-14 HISTORY — DX: Personal history of irradiation: Z92.3

## 2017-01-21 ENCOUNTER — Other Ambulatory Visit: Payer: Self-pay | Admitting: Internal Medicine

## 2017-01-21 DIAGNOSIS — E118 Type 2 diabetes mellitus with unspecified complications: Secondary | ICD-10-CM

## 2017-02-08 ENCOUNTER — Telehealth: Payer: Self-pay | Admitting: Internal Medicine

## 2017-02-10 ENCOUNTER — Ambulatory Visit (INDEPENDENT_AMBULATORY_CARE_PROVIDER_SITE_OTHER): Payer: Medicare HMO | Admitting: Internal Medicine

## 2017-02-10 ENCOUNTER — Encounter: Payer: Self-pay | Admitting: Internal Medicine

## 2017-02-10 ENCOUNTER — Other Ambulatory Visit (INDEPENDENT_AMBULATORY_CARE_PROVIDER_SITE_OTHER): Payer: Medicare HMO

## 2017-02-10 VITALS — BP 140/92 | HR 69 | Temp 97.6°F | Resp 16 | Ht 59.0 in | Wt 181.0 lb

## 2017-02-10 DIAGNOSIS — E785 Hyperlipidemia, unspecified: Secondary | ICD-10-CM

## 2017-02-10 DIAGNOSIS — E118 Type 2 diabetes mellitus with unspecified complications: Secondary | ICD-10-CM

## 2017-02-10 DIAGNOSIS — I10 Essential (primary) hypertension: Secondary | ICD-10-CM

## 2017-02-10 LAB — COMPREHENSIVE METABOLIC PANEL
ALT: 16 U/L (ref 0–35)
AST: 22 U/L (ref 0–37)
Albumin: 3.9 g/dL (ref 3.5–5.2)
Alkaline Phosphatase: 95 U/L (ref 39–117)
BUN: 11 mg/dL (ref 6–23)
CO2: 29 meq/L (ref 19–32)
Calcium: 9.9 mg/dL (ref 8.4–10.5)
Chloride: 106 mEq/L (ref 96–112)
Creatinine, Ser: 0.79 mg/dL (ref 0.40–1.20)
GFR: 88.68 mL/min (ref >60.00)
GLUCOSE: 83 mg/dL (ref 70–99)
POTASSIUM: 3.8 meq/L (ref 3.5–5.1)
Sodium: 140 mEq/L (ref 135–145)
Total Bilirubin: 0.3 mg/dL (ref 0.2–1.2)
Total Protein: 7.3 g/dL (ref 6.0–8.3)

## 2017-02-10 LAB — LIPID PANEL
CHOL/HDL RATIO: 3
CHOLESTEROL: 208 mg/dL — AB (ref 0–200)
HDL: 66 mg/dL (ref >39.00)
LDL Cholesterol: 113 mg/dL — ABNORMAL HIGH (ref 0–99)
NonHDL: 141.58
TRIGLYCERIDES: 144 mg/dL (ref 0.0–149.0)
VLDL: 28.8 mg/dL (ref 0.0–40.0)

## 2017-02-10 LAB — POCT GLUCOSE (DEVICE FOR HOME USE): GLUCOSE FASTING, POC: 78 mg/dL (ref 70–99)

## 2017-02-10 LAB — TSH: TSH: 2.41 u[IU]/mL (ref 0.35–4.50)

## 2017-02-10 LAB — POCT GLYCOSYLATED HEMOGLOBIN (HGB A1C): HEMOGLOBIN A1C: 5.8

## 2017-02-10 MED ORDER — POTASSIUM CHLORIDE CRYS ER 20 MEQ PO TBCR: 20.0000 meq | EXTENDED_RELEASE_TABLET | Freq: Three times a day (TID) | ORAL | 1 refills | Status: DC

## 2017-02-10 NOTE — Patient Instructions (Signed)

## 2017-02-10 NOTE — Progress Notes (Signed)
Subjective:  Patient ID: Brittany [name], female    DOB: [birth year]  Age: 83 y.o. MRN: [id number]  CC: Hypertension; Hyperlipidemia; and Diabetes   HPI Brittany [name] presents for f/up- she has decided to stop taking HCTZ because it causes dry mouth, increased thirst, and sweating. Her BP and BS have been well controlled.  Outpatient Medications Prior to Visit  Medication Sig Dispense Refill  . aspirin EC 81 MG EC tablet Take 81 mg by mouth every other day.     Marland Kitchen atorvastatin (LIPITOR) 20 MG tablet take 1 tablet by mouth once daily 90 tablet 1  . Cholecalciferol 1000 UNIT tablet Take 1,000 Units by mouth daily.      . cloNIDine (CATAPRES) 0.1 MG tablet Take 1 tablet (0.1 mg total) by mouth 3 (three) times daily. 90 tablet 11  . diltiazem (CARDIZEM SR) 60 MG 12 hr capsule take 1 capsule by mouth twice a day 90 capsule 1  . Doxepin HCl (SILENOR) 6 MG TABS Take 1 tablet (6 mg total) by mouth at bedtime as needed. 90 tablet 1  . ferrous sulfate 325 (65 FE) MG tablet Take 1 tablet (325 mg total) by mouth 2 (two) times daily with a meal. 180 tablet 3  . Glucosamine 500 MG TABS Take 500 mg by mouth daily.     . nebivolol (BYSTOLIC) 10 MG tablet Take 1 tablet (10 mg total) by mouth daily. 30 tablet 11  . metFORMIN (GLUCOPHAGE) 500 MG tablet take 1 tablet by mouth daily WITH BREAKFAST 90 tablet 1  . potassium chloride SA (K-DUR,KLOR-CON) 20 MEQ tablet Take 1 tablet (20 mEq total) by mouth 3 (three) times daily. 270 tablet 1  . hydrochlorothiazide (HYDRODIURIL) 25 MG tablet take 1 tablet by mouth once daily (Patient not taking: Reported on 02/10/2017) 90 tablet 1   No facility-administered medications prior to visit.     ROS Review of Systems  Constitutional: Negative.  Negative for appetite change, diaphoresis, fatigue and unexpected weight change.  HENT: Negative.   Eyes: Negative.  Negative for visual disturbance.  Respiratory: Negative for cough, chest tightness, shortness of breath and  wheezing.   Cardiovascular: Negative.  Negative for chest pain, palpitations and leg swelling.  Gastrointestinal: Negative for abdominal pain, constipation, diarrhea, nausea and vomiting.  Endocrine: Positive for polydipsia. Negative for polyphagia and polyuria.  Genitourinary: Negative.  Negative for decreased urine volume, difficulty urinating, dysuria, hematuria and urgency.  Musculoskeletal: Negative.  Negative for arthralgias and myalgias.  Skin: Negative for color change and rash.  Allergic/Immunologic: Negative.   Neurological: Negative.  Negative for dizziness and weakness.  Hematological: Negative for adenopathy. Does not bruise/bleed easily.  Psychiatric/Behavioral: Negative.     Objective:  BP (!) 140/92 (BP Location: Left Arm, Patient Position: Sitting, Cuff Size: Normal)   Pulse 69   Temp 97.6 F (36.4 C) (Oral)   Resp 16   Ht 4\' 11"  (1.499 m)   Wt 181 lb (82.1 kg)   SpO2 99%   BMI 36.56 kg/m   BP Readings from Last 3 Encounters:  02/10/17 (!) 140/92  10/29/16 130/80  06/16/16 138/84    Wt Readings from Last 3 Encounters:  02/10/17 181 lb (82.1 kg)  10/29/16 182 lb (82.6 kg)  06/16/16 181 lb 4 oz (82.2 kg)    Physical Exam  Constitutional: She is oriented to person, place, and time. No distress.  HENT:  Mouth/Throat: Oropharynx is clear and moist. No oropharyngeal exudate.  Eyes: Conjunctivae are normal. Right  eye exhibits no discharge. Left eye exhibits no discharge. No scleral icterus.  Neck: Normal range of motion. Neck supple. No JVD present. No thyromegaly present.  Cardiovascular: Normal rate, regular rhythm and intact distal pulses.  Exam reveals no gallop and no friction rub.   No murmur heard. Pulmonary/Chest: Effort normal and breath sounds normal. No respiratory distress. She has no wheezes. She has no rales. She exhibits no tenderness.  Abdominal: Soft. Bowel sounds are normal. She exhibits no distension and no mass. There is no tenderness. There  is no rebound and no guarding.  Musculoskeletal: Normal range of motion. She exhibits no edema, tenderness or deformity.  Lymphadenopathy:    She has no cervical adenopathy.  Neurological: She is alert and oriented to person, place, and time.  Skin: Skin is warm and dry. No rash noted. She is not diaphoretic. No erythema. No pallor.  Vitals reviewed.   Lab Results  Component Value Date   WBC 5.8 10/29/2016   HGB 13.8 10/29/2016   HCT 41.8 10/29/2016   PLT 343.0 10/29/2016   GLUCOSE 83 02/10/2017   CHOL 208 (H) 02/10/2017   TRIG 144.0 02/10/2017   HDL 66.00 02/10/2017   LDLDIRECT 119.8 04/02/2008   LDLCALC 113 (H) 02/10/2017   ALT 16 02/10/2017   AST 22 02/10/2017   NA 140 02/10/2017   K 3.8 02/10/2017   CL 106 02/10/2017   CREATININE 0.79 02/10/2017   BUN 11 02/10/2017   CO2 29 02/10/2017   TSH 2.41 02/10/2017   HGBA1C 5.8 02/10/2017   MICROALBUR 0.8 10/29/2016    Mm Screening Breast Tomo Bilateral  Result Date: 12/15/2016 CLINICAL DATA:  Screening. EXAM: 2D DIGITAL SCREENING BILATERAL MAMMOGRAM WITH CAD AND ADJUNCT TOMO COMPARISON:  Previous exam(s). ACR Breast Density Category a: The breast tissue is almost entirely fatty. FINDINGS: There are no findings suspicious for malignancy. Images were processed with CAD. IMPRESSION: No mammographic evidence of malignancy. A result letter of this screening mammogram will be mailed directly to the patient. RECOMMENDATION: Screening mammogram in one year. (Code:SM-B-01Y) BI-RADS CATEGORY  1: Negative. Electronically Signed   By: Lajean Manes M.D.   On: 12/15/2016 10:15    Assessment & Plan:   Brittany [name] was seen today for hypertension, hyperlipidemia and diabetes.  Diagnoses and all orders for this visit:  Essential hypertension, benign- her BP is well controlled on the current meds, her lytes and renal fxn are normal -     Comprehensive metabolic panel; Future -     potassium chloride SA (K-DUR,KLOR-CON) 20 MEQ tablet; Take 1 tablet  (20 mEq total) by mouth 3 (three) times daily.  Hyperlipidemia with target LDL less than 100- she has achieved her LDL goal and is doing well on the statin -     Lipid panel; Future -     TSH; Future  Type 2 diabetes mellitus with complication, without long-term current use of insulin ([place])- her A1C is down to 5.8% so I have asked her stop taking metformin -     POCT glycosylated hemoglobin (Hb A1C) -     POCT Glucose (Device for Home Use)   I have discontinued Brittany [name]'s hydrochlorothiazide and metFORMIN. I am also having her maintain her aspirin EC, Cholecalciferol, Glucosamine, cloNIDine, atorvastatin, nebivolol, Doxepin HCl, ferrous sulfate, diltiazem, and potassium chloride SA.  Meds ordered this encounter  Medications  . potassium chloride SA (K-DUR,KLOR-CON) 20 MEQ tablet    Sig: Take 1 tablet (20 mEq total) by mouth 3 (three) times daily.  Dispense:  270 tablet    Refill:  1     Follow-up: Return in about 6 months (around 08/13/2017).  Scarlette Calico, MD

## 2017-02-20 ENCOUNTER — Encounter (HOSPITAL_COMMUNITY): Payer: Self-pay | Admitting: Emergency Medicine

## 2017-02-20 ENCOUNTER — Emergency Department (HOSPITAL_COMMUNITY)
Admission: EM | Admit: 2017-02-20 | Discharge: 2017-02-20 | Disposition: A | Discharge status: Home / Self Care | Payer: Medicare HMO | Attending: Emergency Medicine | Admitting: Emergency Medicine

## 2017-02-20 DIAGNOSIS — Z7982 Long term (current) use of aspirin: Secondary | ICD-10-CM | POA: Insufficient documentation

## 2017-02-20 DIAGNOSIS — I10 Essential (primary) hypertension: Secondary | ICD-10-CM | POA: Diagnosis not present

## 2017-02-20 DIAGNOSIS — Z79899 Other long term (current) drug therapy: Secondary | ICD-10-CM | POA: Insufficient documentation

## 2017-02-20 DIAGNOSIS — E119 Type 2 diabetes mellitus without complications: Secondary | ICD-10-CM | POA: Diagnosis not present

## 2017-02-20 DIAGNOSIS — Z853 Personal history of malignant neoplasm of breast: Secondary | ICD-10-CM | POA: Diagnosis not present

## 2017-02-20 LAB — BASIC METABOLIC PANEL
Anion gap: 7 (ref 5–15)
BUN: 9 mg/dL (ref 6–20)
CALCIUM: 9.2 mg/dL (ref 8.9–10.3)
CO2: 23 mmol/L (ref 22–32)
CREATININE: 0.77 mg/dL (ref 0.44–1.00)
Chloride: 109 mmol/L (ref 101–111)
Glucose, Bld: 90 mg/dL (ref 65–99)
Potassium: 3.9 mmol/L (ref 3.5–5.1)
SODIUM: 139 mmol/L (ref 135–145)

## 2017-02-20 LAB — CBC
HCT: 39.1 % (ref 36.0–46.0)
Hemoglobin: 13.1 g/dL (ref 12.0–15.0)
MCH: 29 pg (ref 26.0–34.0)
MCHC: 33.5 g/dL (ref 30.0–36.0)
MCV: 86.5 fL (ref 78.0–100.0)
Platelets: 330 10*3/uL (ref 150–400)
RBC: 4.52 MIL/uL (ref 3.87–5.11)
RDW: 13.9 % (ref 11.5–15.5)
WBC: 4.1 10*3/uL (ref 4.0–10.5)

## 2017-02-20 NOTE — ED Notes (Signed)
EKG completed/documented in triage. ENM

## 2017-02-20 NOTE — ED Notes (Signed)
Bed: WA19 Expected date:  Expected time:  Means of arrival:  Comments: 

## 2017-02-20 NOTE — ED Triage Notes (Addendum)
Pt report she came in today because her BP was high. Has not taken home BP medications yet today. Pt recently had BP medications adjusted. Denies any symptoms with this such as HA or CP.

## 2017-02-20 NOTE — ED Provider Notes (Signed)
[facility] DEPT Provider Note   CSN: [id number] Arrival date & time: 02/20/17  1023     History   Chief Complaint Chief Complaint  Patient presents with  . Hypertension    HPI Brittany [name] is a 83 y.o. female.  HPI Patient came in for hypertension. Blood pressure of 180/90 at home. He symptomatically. No chest pain. No headache. No confusion. States she does get nervous because blood pressure was high. Saw her primary care doctor on Wednesday. Reviewing the records she said they stopped 7 of her medicines but looking at the records looks like they only stop 2 of them. No swelling or legs. She did not take her medicines today because she states she got up her blood pressure 5 came to the ER. She had around a 5 hour wait to come back to see me so she has not had her medicines for a significant amount of time now.  Past Medical History:  Diagnosis Date  . Cancer ([place])    right breast  . Diabetes mellitus    type 2. 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 Diverticulosis of colon   . DJD (degenerative joint disease) of knee    Left  . Hyperlipidemia   . Hypertension   . Osteoporosis   . Personal history of radiation therapy   . Positive PPD    history off    Patient Active Problem List   Diagnosis Date Noted  . Vitamin D deficiency 04/15/2016  . Anemia, iron deficiency 04/15/2016  . Middle insomnia 04/15/2016  . Routine general medical examination at a health care facility 12/25/2013  . Allergic rhinitis, cause unspecified 04/15/2011  . Type II diabetes mellitus with manifestations ([place]) 05/05/2007  . Hyperlipidemia with target LDL less than 100 05/05/2007  . Essential hypertension, benign 05/05/2007  . OSTEOPOROSIS 05/05/2007  . Hx Breast Cancer ([facility]) right, receptor + 08/25/2006    Class: Stage 1    Past Surgical History:  Procedure Laterality Date  . BREAST LUMPECTOMY Right 2010  . BREAST SURGERY  2010   + RT    OB History    No data  available       Home Medications    Prior to Admission medications   Medication Sig Start Date End Date Taking? Authorizing Provider  aspirin EC 81 MG EC tablet Take 81 mg by mouth every other day.     [provider]  atorvastatin (LIPITOR) 20 MG tablet take 1 tablet by mouth once daily 03/09/16   Janith Lima, MD  Cholecalciferol 1000 UNIT tablet Take 1,000 Units by mouth daily.      [provider]  cloNIDine (CATAPRES) 0.1 MG tablet Take 1 tablet (0.1 mg total) by mouth 3 (three) times daily. 01/22/16   Janith Lima, MD  diltiazem (CARDIZEM SR) 60 MG 12 hr capsule take 1 capsule by mouth twice a day 10/19/16   Janith Lima, MD  Doxepin HCl (SILENOR) 6 MG TABS Take 1 tablet (6 mg total) by mouth at bedtime as needed. 04/15/16   Janith Lima, MD  ferrous sulfate 325 (65 FE) MG tablet Take 1 tablet (325 mg total) by mouth 2 (two) times daily with a meal. 04/15/16   Janith Lima, MD  Glucosamine 500 MG TABS Take 500 mg by mouth daily.     [provider]  nebivolol (BYSTOLIC) 10 MG tablet Take 1 tablet (10 mg total) by mouth daily. 04/15/16  Janith Lima, MD  potassium chloride SA (K-DUR,KLOR-CON) 20 MEQ tablet Take 1 tablet (20 mEq total) by mouth 3 (three) times daily. 02/10/17   Janith Lima, MD    Family History Family History  Problem Relation Age of Onset  . Heart disease Mother   . Diabetes Father   . Diabetes Unknown        1st degree relative  . Hypertension Unknown   . Stroke Unknown   . Cancer Unknown        cervical  . Lupus Unknown   . Coronary artery disease Unknown     Social History Social History  Substance Use Topics  . Smoking status: Never Smoker  . Smokeless tobacco: Never Used  . Alcohol use No     Allergies   Amlodipine besylate; Chlorthalidone; Olmesartan; Alendronate sodium; and Lisinopril   Review of Systems Review of Systems  Constitutional: Negative for appetite change.  HENT: Negative for  congestion.   Respiratory: Negative for shortness of breath.   Cardiovascular: Negative for chest pain and leg swelling.  Gastrointestinal: Negative for abdominal pain.  Genitourinary: Negative for flank pain and frequency.  Musculoskeletal: Negative for back pain.  Skin: Negative for color change.  Neurological: Negative for weakness.  Psychiatric/Behavioral: Negative for behavioral problems.     Physical Exam Updated Vital Signs BP (!) 217/121 (BP Location: Left Arm) Comment: Missed Meds today  Pulse 74   Temp 98.1 F (36.7 C) (Oral)   Resp 18   SpO2 100%   Physical Exam  Constitutional: She appears well-developed.  HENT:  Head: Atraumatic.  Neck: Neck supple.  Cardiovascular: Normal rate.   Pulmonary/Chest: Effort normal.  Abdominal: Soft.  Musculoskeletal: She exhibits no edema.  Neurological: She is alert.  Skin: Skin is warm.  Psychiatric: She has a normal mood and affect.     ED Treatments / Results  Labs (all labs ordered are listed, but only abnormal results are displayed) Labs Reviewed  BASIC METABOLIC PANEL  CBC    EKG  EKG Interpretation  Date/Time:  Saturday February 20 2017 11:03:30 EDT Ventricular Rate:  62 PR Interval:    QRS Duration: 80 QT Interval:  433 QTC Calculation: 440 R Axis:   -23 Text Interpretation:  Sinus rhythm Probable left atrial enlargement Borderline left axis deviation Low voltage, precordial leads Consider anterior infarct No significant change since last tracing Confirmed by Orlie Dakin [phone]) on 02/20/2017 11:05:43 AM       Radiology No results found.  Procedures Procedures (including critical care time)  Medications Ordered in ED Medications - No data to display   Initial Impression / Assessment and Plan / ED Course  I have reviewed the triage vital signs and the nursing notes.  Pertinent labs & imaging results that were available during my care of the patient were reviewed by me and considered in my  medical decision making (see chart for details).     Patient with hypertension. Somewhat variable but about 703 systolic. She however has not had her medicines today. There also was question ofwhich medicines were stopped.There is no apparent end organ damage. Patient will go home and take her medicines. Will call her primary care doctor about which medicines were stopped. Will discharge home.  Final Clinical Impressions(s) / ED Diagnoses   Final diagnoses:  Hypertension, unspecified type    New Prescriptions New Prescriptions   No medications on file     Davonna Belling, MD 02/20/17 1546

## 2017-02-23 ENCOUNTER — Telehealth: Payer: Self-pay | Admitting: Internal Medicine

## 2017-02-23 DIAGNOSIS — I10 Essential (primary) hypertension: Secondary | ICD-10-CM

## 2017-02-23 NOTE — Telephone Encounter (Signed)
yes

## 2017-02-23 NOTE — Telephone Encounter (Signed)
Ok for referral?

## 2017-02-23 NOTE — Telephone Encounter (Signed)
Referral entered  

## 2017-02-23 NOTE — Telephone Encounter (Signed)
Pt called asking for a referral to Cardiology due to her high BP. She said that she would like to see Dr Daneen Schick.

## 2017-02-26 ENCOUNTER — Ambulatory Visit (INDEPENDENT_AMBULATORY_CARE_PROVIDER_SITE_OTHER): Payer: Medicare HMO | Admitting: Family Medicine

## 2017-02-26 ENCOUNTER — Encounter: Payer: Self-pay | Admitting: Family Medicine

## 2017-02-26 VITALS — BP 172/102 | HR 63 | Temp 97.7°F | Ht 59.0 in | Wt 176.0 lb

## 2017-02-26 DIAGNOSIS — I10 Essential (primary) hypertension: Secondary | ICD-10-CM

## 2017-02-26 NOTE — Patient Instructions (Addendum)
Thank you for coming in,   Please try taking 2 of your medications in the morning and one of them at night for your blood pressure. Please follow-up in a nurse visit after he had taken her blood pressure medications. Need to do this next week.    Please feel free to call with any questions or concerns at any time, at [phone]. --Dr. Raeford Razor

## 2017-02-26 NOTE — Progress Notes (Signed)
Brittany [name] - 83 y.o. female MRN [id number]  Date of birth: [birth year]  SUBJECTIVE:  Including CC & ROS.  Chief Complaint  Patient presents with  . Hypertension    patient was here on the 15th and medication were changed around and since then her BP has been going up and this morning she is having heavyness in the chest. 175/92 was BP last night    Brittany [name] is a 50-year-old female is following up for hypertension. She reports intolerance to hydrochlorothiazide so she has stopped it. She has not taken her blood pressure medications today. She did not have any chest pain or shortness of breath. She denies any lightheadedness or swelling of the left extremities. She has a history of intolerance to amlodipine, lisinopril, and olmsartan.   Review of her previous vital signs show that she has had a systolic in the 662 range on 04/15/16. Her most recent blood pressure was elevated to 193/120 on 02/20/17. She was seen in the emergency department on 02/20/17 for elevated blood pressure. An EKG at that time showed sinus rhythm and no significant change from previous tracing. She was seen by [name] on 8/15 for hypertension. Her hydrochlorothiazide was stopped at that encounter.  Review of Systems  Respiratory: Negative for shortness of breath.   Cardiovascular: Negative for leg swelling.  Neurological: Negative for weakness and numbness.    HISTORY: Past Medical, Surgical, Social, and Family History Reviewed & Updated per EMR.   Pertinent Historical Findings include:  Past Medical History:  Diagnosis Date  . Cancer ([place])    right breast  . Diabetes mellitus    type 2. 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 Diverticulosis of colon   . DJD (degenerative joint disease) of knee    Left  . Hyperlipidemia   . Hypertension   . Osteoporosis   . Personal history of radiation therapy   . Positive PPD    history off    Past Surgical History:  Procedure Laterality Date  . BREAST  LUMPECTOMY Right 2010  . BREAST SURGERY  2010   + RT    Allergies  Allergen Reactions  . Amlodipine Besylate     Muscle aches  . Chlorthalidone Other (See Comments)    Muscle aches  . Olmesartan     headache  . Alendronate Sodium     REACTION: heartburn  . Lisinopril     REACTION: dizziness    Family History  Problem Relation Age of Onset  . Heart disease Mother   . Diabetes Father   . Diabetes Unknown        1st degree relative  . Hypertension Unknown   . Stroke Unknown   . Cancer Unknown        cervical  . Lupus Unknown   . Coronary artery disease Unknown      Social History   Social History  . Marital status: Widowed    Spouse name: N/A  . Number of children: 4  . Years of education: N/A   Occupational History  . 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 Smoking status: Never Smoker  . Smokeless tobacco: Never Used  . Alcohol use No  . Drug use: No  . Sexual activity: No   Other Topics Concern  . 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  PHYSICAL EXAM:  VS: BP (!) 172/102 (BP Location: Left Arm, Patient Position: Sitting, Cuff Size: Normal)   Pulse 63   Temp 97.7 F (36.5 C) (Oral)   Ht 4\' 11"  (1.499 m)   Wt 176 lb (79.8 kg)   SpO2 100%   BMI 35.55 kg/m  Physical Exam Gen: NAD, alert, cooperative with exam,  ENT: normal lips, normal nasal mucosa,  Eye: normal EOM, normal conjunctiva and lids CV:  no edema, +2 pedal pulses, regular in rhythm, S1-S2   Resp: no accessory muscle use, non-labored, clear to auscultation bilaterally, no wheezes or crackles  GI: no masses or tenderness, no hernia, soft, nondistended  Skin: no rashes, no areas of induration  Neuro: normal tone, normal sensation to touch Psych:  normal insight, alert and oriented MSK: Normal strength, normal gait      ASSESSMENT & PLAN:   Essential hypertension, benign Has not taken  her medications today. Uncontrolled currently. Reports intolerance with several different medications. . - Advised to follow-up next week and a nurse visit after she has taken her medications for her neck or reading. - Couldn consider increasing bystolic or dilt but her rate is in the 60's currently.

## 2017-02-26 NOTE — Assessment & Plan Note (Addendum)
Has not taken her medications today. Uncontrolled currently. Reports intolerance with several different medications. . - Advised to follow-up next week and a nurse visit after she has taken her medications for her neck or reading. - Couldn consider increasing bystolic or dilt but her rate is in the 60's currently.

## 2017-03-03 ENCOUNTER — Ambulatory Visit (INDEPENDENT_AMBULATORY_CARE_PROVIDER_SITE_OTHER): Payer: Medicare HMO | Admitting: Internal Medicine

## 2017-03-03 ENCOUNTER — Encounter: Payer: Self-pay | Admitting: Internal Medicine

## 2017-03-03 VITALS — BP 160/98 | HR 57 | Temp 98.1°F | Resp 16 | Ht 59.0 in | Wt 176.0 lb

## 2017-03-03 DIAGNOSIS — I10 Essential (primary) hypertension: Secondary | ICD-10-CM | POA: Diagnosis not present

## 2017-03-03 MED ORDER — SPIRONOLACTONE 25 MG PO TABS: 12.5000 mg | ORAL_TABLET | Freq: Every day | ORAL | 1 refills | Status: DC

## 2017-03-03 NOTE — Patient Instructions (Signed)

## 2017-03-03 NOTE — Progress Notes (Signed)
Subjective:  Patient ID: Brittany [name], female    DOB: June 26, 1929  Age: 83 y.o. MRN: [id number]  CC: Hypertension   HPI Brittany [name] presents for a BP check - she was seen about 5 days ago for high BP, no changes were made. She has been compliant with her meds but her BP is not well controlled, she wants to get better control. She feels well and offers no complaints.  Outpatient Medications Prior to Visit  Medication Sig Dispense Refill  . aspirin EC 81 MG EC tablet Take 81 mg by mouth every other day.     Marland Kitchen atorvastatin (LIPITOR) 20 MG tablet take 1 tablet by mouth once daily 90 tablet 1  . Cholecalciferol 1000 UNIT tablet Take 1,000 Units by mouth daily.      . cloNIDine (CATAPRES) 0.1 MG tablet Take 1 tablet (0.1 mg total) by mouth 3 (three) times daily. 90 tablet 11  . diltiazem (CARDIZEM SR) 60 MG 12 hr capsule take 1 capsule by mouth twice a day 90 capsule 1  . Doxepin HCl (SILENOR) 6 MG TABS Take 1 tablet (6 mg total) by mouth at bedtime as needed. 90 tablet 1  . ferrous sulfate 325 (65 FE) MG tablet Take 1 tablet (325 mg total) by mouth 2 (two) times daily with a meal. 180 tablet 3  . Glucosamine 500 MG TABS Take 500 mg by mouth daily.     . metFORMIN (GLUCOPHAGE) 500 MG tablet take 1 tablet by mouth once daily WITH BREAKFAST  0  . nebivolol (BYSTOLIC) 10 MG tablet Take 1 tablet (10 mg total) by mouth daily. 30 tablet 11  . potassium chloride SA (K-DUR,KLOR-CON) 20 MEQ tablet Take 1 tablet (20 mEq total) by mouth 3 (three) times daily. 270 tablet 1   No facility-administered medications prior to visit.     ROS Review of Systems  Constitutional: Negative.  Negative for chills, diaphoresis, fatigue and fever.  HENT: Negative.   Eyes: Negative.   Respiratory: Negative.  Negative for cough, chest tightness, shortness of breath and wheezing.   Cardiovascular: Negative.  Negative for chest pain, palpitations and leg swelling.  Gastrointestinal: Negative for abdominal pain,  constipation, nausea and vomiting.  Genitourinary: Negative.  Negative for difficulty urinating, dysuria and hematuria.  Musculoskeletal: Negative for back pain and neck pain.  Skin: Negative.   Allergic/Immunologic: Negative.   Neurological: Negative.  Negative for dizziness, weakness, numbness and headaches.  Hematological: Negative for adenopathy. Does not bruise/bleed easily.  Psychiatric/Behavioral: Negative.     Objective:  BP (!) 160/98 (BP Location: Left Arm, Patient Position: Sitting, Cuff Size: Normal)   Pulse (!) 57   Temp 98.1 F (36.7 C) (Oral)   Resp 16   Ht 4\' 11"  (1.499 m)   Wt 176 lb (79.8 kg)   SpO2 99%   BMI 35.55 kg/m   BP Readings from Last 3 Encounters:  03/03/17 (!) 160/98  02/26/17 (!) 172/102  02/20/17 (!) 193/120    Wt Readings from Last 3 Encounters:  03/03/17 176 lb (79.8 kg)  02/26/17 176 lb (79.8 kg)  02/10/17 181 lb (82.1 kg)    Physical Exam  Constitutional: She is oriented to person, place, and time. No distress.  HENT:  Mouth/Throat: Oropharynx is clear and moist. No oropharyngeal exudate.  Eyes: Conjunctivae are normal. Right eye exhibits no discharge. Left eye exhibits no discharge. No scleral icterus.  Neck: Normal range of motion. Neck supple. No JVD present. No thyromegaly present.  Cardiovascular: Normal rate, regular rhythm and intact distal pulses.  Exam reveals no gallop and no friction rub.   No murmur heard. Pulmonary/Chest: Effort normal and breath sounds normal. No respiratory distress. She has no wheezes. She has no rales. She exhibits no tenderness.  Abdominal: Soft. Bowel sounds are normal. She exhibits no distension and no mass. There is no tenderness. There is no rebound and no guarding.  Musculoskeletal: Normal range of motion. She exhibits no edema, tenderness or deformity.  Lymphadenopathy:    She has no cervical adenopathy.  Neurological: She is alert and oriented to person, place, and time.  Skin: Skin is warm  and dry. No rash noted. She is not diaphoretic. No erythema. No pallor.  Vitals reviewed.   Lab Results  Component Value Date   WBC 4.1 02/20/2017   HGB 13.1 02/20/2017   HCT 39.1 02/20/2017   PLT 330 02/20/2017   GLUCOSE 90 02/20/2017   CHOL 208 (H) 02/10/2017   TRIG 144.0 02/10/2017   HDL 66.00 02/10/2017   LDLDIRECT 119.8 04/02/2008   LDLCALC 113 (H) 02/10/2017   ALT 16 02/10/2017   AST 22 02/10/2017   NA 139 02/20/2017   K 3.9 02/20/2017   CL 109 02/20/2017   CREATININE 0.77 02/20/2017   BUN 9 02/20/2017   CO2 23 02/20/2017   TSH 2.41 02/10/2017   HGBA1C 5.8 02/10/2017   MICROALBUR 0.8 10/29/2016    No results found.  Assessment & Plan:   Brittany [name] was seen today for hypertension.  Diagnoses and all orders for this visit:  Essential hypertension, benign- Her blood pressure is not well controlled with clonidine, calcium channel blocker, and a beta blocker. She does not tolerate thiazide diuretics and has a history of hypokalemia. I've asked her to start taking a potassium sparing diuretic for blood pressure control. -     spironolactone (ALDACTONE) 25 MG tablet; Take 0.5 tablets (12.5 mg total) by mouth daily.   I am having Brittany [name] start on spironolactone. I am also having her maintain her aspirin EC, Cholecalciferol, Glucosamine, cloNIDine, atorvastatin, nebivolol, Doxepin HCl, ferrous sulfate, diltiazem, potassium chloride SA, and metFORMIN.  Meds ordered this encounter  Medications  . spironolactone (ALDACTONE) 25 MG tablet    Sig: Take 0.5 tablets (12.5 mg total) by mouth daily.    Dispense:  45 tablet    Refill:  1     Follow-up: Return in about 4 weeks (around 03/31/2017).  Scarlette Calico, MD

## 2017-03-08 ENCOUNTER — Telehealth: Payer: Self-pay | Admitting: Internal Medicine

## 2017-03-08 NOTE — Telephone Encounter (Signed)
spironolactone (ALDACTONE) 25 MG tablet [  Patient is requesting a call back about this medication. She states she is cutting it in half right now. She wants to know if she can take the whole pill. She states she doesn't believe her BP has changed that much. Please advise.

## 2017-03-08 NOTE — Telephone Encounter (Signed)
Pt informed of MD response.  

## 2017-03-08 NOTE — Telephone Encounter (Signed)
Yes, she can take a whole tablet

## 2017-03-22 ENCOUNTER — Other Ambulatory Visit: Payer: Self-pay | Admitting: Internal Medicine

## 2017-03-22 DIAGNOSIS — I10 Essential (primary) hypertension: Secondary | ICD-10-CM

## 2017-03-22 DIAGNOSIS — E118 Type 2 diabetes mellitus with unspecified complications: Secondary | ICD-10-CM

## 2017-03-24 ENCOUNTER — Encounter: Payer: Self-pay | Admitting: Interventional Cardiology

## 2017-03-30 ENCOUNTER — Other Ambulatory Visit: Payer: Self-pay | Admitting: Internal Medicine

## 2017-03-30 DIAGNOSIS — I10 Essential (primary) hypertension: Secondary | ICD-10-CM

## 2017-04-02 ENCOUNTER — Encounter (INDEPENDENT_AMBULATORY_CARE_PROVIDER_SITE_OTHER): Payer: Self-pay

## 2017-04-02 ENCOUNTER — Encounter: Payer: Self-pay | Admitting: Interventional Cardiology

## 2017-04-02 ENCOUNTER — Ambulatory Visit (INDEPENDENT_AMBULATORY_CARE_PROVIDER_SITE_OTHER): Payer: Medicare HMO | Admitting: Interventional Cardiology

## 2017-04-02 VITALS — BP 170/92 | HR 68 | Ht 59.0 in | Wt 174.1 lb

## 2017-04-02 DIAGNOSIS — E785 Hyperlipidemia, unspecified: Secondary | ICD-10-CM | POA: Diagnosis not present

## 2017-04-02 DIAGNOSIS — I10 Essential (primary) hypertension: Secondary | ICD-10-CM

## 2017-04-02 DIAGNOSIS — E118 Type 2 diabetes mellitus with unspecified complications: Secondary | ICD-10-CM | POA: Diagnosis not present

## 2017-04-02 LAB — BASIC METABOLIC PANEL
BUN / CREAT RATIO: 12 (ref 12–28)
BUN: 12 mg/dL (ref 8–27)
CO2: 19 mmol/L — AB (ref 20–29)
CREATININE: 1.01 mg/dL — AB (ref 0.57–1.00)
Calcium: 9.6 mg/dL (ref 8.7–10.3)
Chloride: 104 mmol/L (ref 96–106)
GFR calc Af Amer: 58 mL/min/{1.73_m2} — ABNORMAL LOW (ref >59)
GFR, EST NON AFRICAN AMERICAN: 51 mL/min/{1.73_m2} — AB (ref >59)
GLUCOSE: 100 mg/dL — AB (ref 65–99)
Potassium: 4.7 mmol/L (ref 3.5–5.2)
SODIUM: 142 mmol/L (ref 134–144)

## 2017-04-02 MED ORDER — AMLODIPINE BESYLATE 5 MG PO TABS: 5.0000 mg | ORAL_TABLET | Freq: Every day | ORAL | 3 refills | Status: DC

## 2017-04-02 NOTE — Patient Instructions (Signed)
Medication Instructions:  1) DISCONTINUE Diltiazem 2) START Amlodipine 5mg  once dailt  Labwork: BMET today  Testing/Procedures: None  Follow-Up: Your physician recommends that you schedule a follow-up appointment in: 2-4 weeks with our Hypertension Clinic  Your physician wants you to follow-up in: 4-6 months with Dr. Tamala Julian.  You will receive a reminder letter in the mail two months in advance. If you don't receive a letter, please call our office to schedule the follow-up appointment.    Any Other Special Instructions Will Be Listed Below (If Applicable).     If you need a refill on your cardiac medications before your next appointment, please call your pharmacy.

## 2017-04-02 NOTE — Progress Notes (Signed)
Cardiology Office Note    Date:  04/02/2017   ID:  Brittany [name], Brittany [name] 26, 1930, MRN [id number]  PCP:  Janith Lima, MD  Cardiologist: Sinclair Grooms, MD   Chief Complaint  Patient presents with  . Advice Only    Hypertension    History of Present Illness:  Brittany [name] is a 83 y.o. female who is referred by Dr. Scarlette Calico for evaluation and help with management of hypertension. Previously seen by Dr. Peter [place]. Was found to have diastolic dysfunction during prior evaluation.  She is here today for help with blood pressure. She has a listed intolerances that include amlodipine (muscle soreness), chlorthalidone (muscle aches), hydrochlorothiazide (wedding and itching), lisinopril (dizziness), on losartan (headaches). Recently Aldactone 25 mg per day is been started. She is asymptomatic with reference to blood pressure. She denies chest pain, orthopnea, PND, lower extremity swelling, and palpitations.  Concerned that blood pressures have been running 200s /100s millimeters mercury.  Past Medical History:  Diagnosis Date  . Cancer ([place])    right breast  . Diabetes mellitus    type 2. 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 Diverticulosis of colon   . DJD (degenerative joint disease) of knee    Left  . Hyperlipidemia   . Hypertension   . Osteoporosis   . Personal history of radiation therapy   . Positive PPD    history off    Past Surgical History:  Procedure Laterality Date  . BREAST LUMPECTOMY Right 2010  . BREAST SURGERY  2010   + RT    Current Medications: Outpatient Medications Prior to Visit  Medication Sig Dispense Refill  . aspirin EC 81 MG EC tablet Take 81 mg by mouth every other day.     . cloNIDine (CATAPRES) 0.1 MG tablet take 1 tablet by mouth three times a day 90 tablet 11  . diltiazem (CARDIZEM SR) 60 MG 12 hr capsule take 1 capsule by mouth twice a day 90 capsule 1  . metFORMIN (GLUCOPHAGE) 500 MG tablet take 1 tablet by  mouth once daily WITH BREAKFAST  0  . nebivolol (BYSTOLIC) 10 MG tablet Take 1 tablet (10 mg total) by mouth daily. 30 tablet 11  . potassium chloride SA (K-DUR,KLOR-CON) 20 MEQ tablet Take 1 tablet (20 mEq total) by mouth 3 (three) times daily. 270 tablet 1  . spironolactone (ALDACTONE) 25 MG tablet Take 0.5 tablets (12.5 mg total) by mouth daily. 45 tablet 1  . atorvastatin (LIPITOR) 20 MG tablet take 1 tablet by mouth once daily (Patient not taking: Reported on 04/02/2017) 90 tablet 1  . Cholecalciferol 1000 UNIT tablet Take 1,000 Units by mouth daily.      . Doxepin HCl (SILENOR) 6 MG TABS Take 1 tablet (6 mg total) by mouth at bedtime as needed. (Patient not taking: Reported on 04/02/2017) 90 tablet 1  . ferrous sulfate 325 (65 FE) MG tablet Take 1 tablet (325 mg total) by mouth 2 (two) times daily with a meal. (Patient not taking: Reported on 04/02/2017) 180 tablet 3  . Glucosamine 500 MG TABS Take 500 mg by mouth daily.      No facility-administered medications prior to visit.      Allergies:   Hctz [hydrochlorothiazide]; Amlodipine besylate; Chlorthalidone; Olmesartan; Alendronate sodium; and Lisinopril   Social History   Social History  . Marital status: Widowed    Spouse name: N/A  . Number of children: 4  . Years  of education: N/A   Occupational History  . 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 Smoking status: Never Smoker  . Smokeless tobacco: Never Used  . Alcohol use No  . Drug use: No  . Sexual activity: No   Other Topics Concern  . 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    ROS:   Please see the history of present illness.     occasional heartburn, muscle aches, headaches, and difficulty with balance. Has not had antihypertensive therapy yet today.  All other systems reviewed and are negative.   PHYSICAL EXAM:   VS:  BP (!) 170/92 (BP Location: Left Arm)   Pulse 68   Ht 4\' 11"  (1.499 m)   Wt 174 lb 1.9 oz (79 kg)   BMI 35.17 kg/m    GEN: Well nourished, well developed, in no acute distress . Noticeable short statue.  HEENT: normal  Neck: no JVD, carotid bruits, or masses Cardiac: RRR; no murmurs, rubs, or gallops,no edema  Respiratory:  clear to auscultation bilaterally, normal work of breathing GI: soft, nontender, nondistended, + BS MS: no deformity or atrophy  Skin: warm and dry, no rash Neuro:  Alert and Oriented x 3, Strength and sensation are intact Psych: euthymic mood, full affect  Wt Readings from Last 3 Encounters:  04/02/17 174 lb 1.9 oz (79 kg)  03/03/17 176 lb (79.8 kg)  02/26/17 176 lb (79.8 kg)      Studies/Labs Reviewed:   EKG:  EKG  From 02/21/2017 revealed nonspecific T wave flattening, normal sinus rhythm, decreased voltage, poor R-wave progression.  Recent Labs: 02/10/2017: ALT 16; TSH 2.41 02/20/2017: BUN 9; Creatinine, Ser 0.77; Hemoglobin 13.1; Platelets 330; Potassium 3.9; Sodium 139   Lipid Panel    Component Value Date/Time   CHOL 208 (H) 02/10/2017 1634   TRIG 144.0 02/10/2017 1634   TRIG 70 07/12/2006 0949   HDL 66.00 02/10/2017 1634   CHOLHDL 3 02/10/2017 1634   VLDL 28.8 02/10/2017 1634   LDLCALC 113 (H) 02/10/2017 1634   LDLDIRECT 119.8 04/02/2008 0000    Additional studies/ records that were reviewed today include:  Normal echo with mild diastolic dysfunction 6387.   ASSESSMENT:    1. Essential hypertension, benign   2. Type 2 diabetes mellitus with complication, without long-term current use of insulin ([place])   3. Hyperlipidemia with target LDL less than 100      PLAN:  In order of  problems listed above: 1. Discontinue diltiazem. Resume amlodipine 5 mg per day. Agree with Aldactone. Will check basic metabolic panel today. This medication could potentially be increased in intensity. Amlodipine could as well. I don't believe amlodipine is associated with muscle aches. Medication complaints seem to be off target with reference to intolerances. 2. Not addressed  3.  Not addressed  Blood pressure clinic in 2-4 weeks. Basic metabolic panel today. Consider increasing spironolactone to 50 mg per day. Amlodipine started and diltiazem discontinued.   Medication Adjustments/Labs and Tests Ordered: Current medicines are reviewed at length with the patient today.  Concerns regarding medicines are  outlined above.  Medication changes, Labs and Tests ordered today are listed in the Patient Instructions below. There are no Patient Instructions on file for this visit.   Signed, Sinclair Grooms, MD  04/02/2017 10:36 AM    [facility] [place], [place], [place]  [address] Phone: [phone]; Fax: [phone]

## 2017-04-05 ENCOUNTER — Telehealth: Payer: Self-pay

## 2017-04-05 NOTE — Telephone Encounter (Signed)
spoke with patient about recent lab results. patient verbalized understanding.

## 2017-04-29 NOTE — Progress Notes (Signed)
Patient ID: Brittany [name]                 DOB: 10-12-1928                      MRN: [id number]     HPI: Brittany [name] is a 83 y.o. female referred by Dr. Tamala Julian to HTN clinic. PMH is significant for R breast cancer, allergic rhinitis, T2DM, OA L knee, HTN, and HLD. At last office visit with PCP, spironolactone was initiated. Pt has a history of multiple medication intolerances noted below and was referred to Dr. Tamala Julian for further management. At office visit with Dr. Tamala Julian 04/01/17 BP was 170/92 mmHg so diltiazem was stopped, amlodipine was resumed, and pt was referred to HTN clinic. BMET was drawn showing slight increase in SCr from 0.77 to 1.01, and K increased from 3.9 to 4.7.  Pt presents today ambulating in good spirits. She reports tolerating amlodipine well with no AE since starting last month. Pt states she has not been taking clonidine and thinks this was previously discontinued by her PCP, and she is almost out of spironolactone as well. Pt states she used to have low potassium and this is why she was started on KCl supplementation. Pt denies HA or blurry vision when BP is elevated at home and most readings have been <150/90 but she does not have a log with her. She denies having any low BP readings.   Current HTN meds: clonidine 0.1mg  TID (pt reports not taking), nebivolol 10mg  daily, spironolactone 12.5mg  daily, amlodipine 5mg  daily  Previously tried: amlodipine (muscle soreness), chlorthalidone (muscle aches), HCTZ (sweating, itching), lisinopril (dizziness), olmesartan (HA), diltiazem (stopped by MD to change to amlodipine)  BP goal: <140/90 mmHg - targeting more conservative goal given age  Family History: The patient's family history includes Cancer in her unknown relative; Coronary artery disease in her unknown relative; Diabetes in her father and unknown relative; Heart disease in her mother; Hypertension in her unknown relative; Lupus in her unknown relative; Stroke in her unknown  relative.   Social History: never smoker, denies alcohol or illicit drug use  Diet: morning meal (~11:00) - cereal and banana, cheese sandwich; evening meal (~5:00) - salads with egg, cheese, spinach, broccoli; sometimes adds salt to food; avoids fried foods; eats cheetos occasionally; mostly cooks own meals. One cup of coffee/day.  Exercise: walks and does leg workouts every other day for 30 minutes   Home BP readings: Mostly 130-140s/80-90s, but occasionally 150s. Pt denies low readings.   Wt Readings from Last 3 Encounters:  04/02/17 174 lb 1.9 oz (79 kg)  03/03/17 176 lb (79.8 kg)  02/26/17 176 lb (79.8 kg)   BP Readings from Last 3 Encounters:  04/02/17 (!) 170/92  03/03/17 (!) 160/98  02/26/17 (!) 172/102   Pulse Readings from Last 3 Encounters:  04/02/17 68  03/03/17 (!) 57  02/26/17 63    Renal function: CrCl cannot be calculated (Patient's most recent lab result is older than the maximum 21 days allowed.).  Past Medical History:  Diagnosis Date  . Cancer ([place])    right breast  . Diabetes mellitus    type 2. 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 Diverticulosis of colon   . DJD (degenerative joint disease) of knee    Left  . Hyperlipidemia   . Hypertension   . Osteoporosis   . Personal history of radiation therapy   . Positive PPD  history off    Current Outpatient Prescriptions on File Prior to Visit  Medication Sig Dispense Refill  . amLODipine (NORVASC) 5 MG tablet Take 1 tablet (5 mg total) by mouth daily. 90 tablet 3  . aspirin EC 81 MG EC tablet Take 81 mg by mouth every other day.     . cloNIDine (CATAPRES) 0.1 MG tablet take 1 tablet by mouth three times a day 90 tablet 11  . metFORMIN (GLUCOPHAGE) 500 MG tablet take 1 tablet by mouth once daily WITH BREAKFAST  0  . nebivolol (BYSTOLIC) 10 MG tablet Take 1 tablet (10 mg total) by mouth daily. 30 tablet 11  . potassium chloride SA (K-DUR,KLOR-CON) 20 MEQ tablet Take 1 tablet (20 mEq  total) by mouth 3 (three) times daily. 270 tablet 1  . spironolactone (ALDACTONE) 25 MG tablet Take 0.5 tablets (12.5 mg total) by mouth daily. 45 tablet 1   No current facility-administered medications on file prior to visit.     Allergies  Allergen Reactions  . Hctz [Hydrochlorothiazide] Other (See Comments)    Reactions: sweating, heart pain, itching  . Amlodipine Besylate     Muscle aches  . Chlorthalidone Other (See Comments)    Muscle aches  . Olmesartan     headache  . Alendronate Sodium     REACTION: heartburn  . Lisinopril     REACTION: dizziness     Assessment/Plan:  1. Hypertension - BP remains uncontrolled in clinic today and above goal of <140/90 mmHg, though pt reports not taking her antihypertensives today and home BP readings are slightly lower than clinic reading. Pt appears somewhat confused by antihypertensive regimen and was encouraged to bring all of her medications with her at the next clinic visit. Will continue nebivolol 10mg  daily and amlodipine 5mg  daily. Will not restart clonidine since patient self-discontinued this, and instead increase spironolactone to 25mg  daily. Given rise in serum K after starting spironolactone will reduce daily KCl supplementation to 20 mEq daily and recheck BMET in 1 week. At next visit could consider increasing amlodipine to 10mg  daily or further titrating spironolactone depending on repeat BMET. Pt counseled on limiting sodium-rich foods and continue staying active. Follow-up in HTN clinic in 2-3 weeks.   Arrie Senate, PharmD PGY-2 Cardiology Pharmacy Resident Pager: [phone] 04/30/2017

## 2017-04-30 ENCOUNTER — Ambulatory Visit (INDEPENDENT_AMBULATORY_CARE_PROVIDER_SITE_OTHER): Payer: Medicare HMO | Admitting: Pharmacist

## 2017-04-30 DIAGNOSIS — I10 Essential (primary) hypertension: Secondary | ICD-10-CM

## 2017-04-30 MED ORDER — SPIRONOLACTONE 25 MG PO TABS: 25.0000 mg | ORAL_TABLET | Freq: Every day | ORAL | 3 refills | Status: DC

## 2017-04-30 MED ORDER — POTASSIUM CHLORIDE CRYS ER 20 MEQ PO TBCR: 20.0000 meq | EXTENDED_RELEASE_TABLET | Freq: Every day | ORAL | 1 refills | Status: DC

## 2017-04-30 NOTE — Patient Instructions (Signed)
It was great to meet you today.  Continue taking nebivolol (Bystolic) 10mg  daily and amlodipine (Norvasc) 5mg  daily.  Increase your spironolactone to 25mg  (1 full tablet) once daily.  Reduce your potassium chloride to 20 mEq (1 tablet) once daily.  Return for lab work in 1 week.  Return to clinic in 3 weeks.

## 2017-05-07 ENCOUNTER — Other Ambulatory Visit: Payer: Medicare HMO | Admitting: *Deleted

## 2017-05-07 DIAGNOSIS — I10 Essential (primary) hypertension: Secondary | ICD-10-CM

## 2017-05-07 LAB — BASIC METABOLIC PANEL
BUN/Creatinine Ratio: 14 (ref 12–28)
BUN: 13 mg/dL (ref 8–27)
CALCIUM: 10 mg/dL (ref 8.7–10.3)
CO2: 23 mmol/L (ref 20–29)
Chloride: 102 mmol/L (ref 96–106)
Creatinine, Ser: 0.92 mg/dL (ref 0.57–1.00)
GFR calc Af Amer: 65 mL/min/{1.73_m2} (ref >59)
GFR, EST NON AFRICAN AMERICAN: 57 mL/min/{1.73_m2} — AB (ref >59)
Glucose: 95 mg/dL (ref 65–99)
Potassium: 4.6 mmol/L (ref 3.5–5.2)
Sodium: 139 mmol/L (ref 134–144)

## 2017-05-16 NOTE — Progress Notes (Signed)
Patient ID: Brittany [name]                 DOB: 11-28-1928                      MRN: [id number]     HPI: Brittany [name] is a 83 y.o. female referred by Dr. Tamala [name] to HTN clinic. PMH is significant for R breast cancer, allergic rhinitis, T2DM, OA L knee, HTN, and HLD. At last office visit with PCP, spironolactone was initiated. Pt has a history of multiple medication intolerances noted below and was referred to Dr. Tamala [name] for further management. At office visit with Dr. Tamala [name] 04/01/17 BP was 170/92 mmHg so diltiazem was stopped, amlodipine was restarted, and pt was referred to HTN clinic. BMET was drawn showing slight increase in SCr from 0.77 to 1.01, and K increased from 3.9 to 4.7. At initial HTN clinic, pt was confused about medication regimen and states she had stopped taking her clonidine previously. Spironolactone was increased to 25mg  daily and KCl supplementation was reduced, repeat BMET remained stable.  Pt presents to clinic reporting increased muscle pain for about 2 weeks. She attributes this to her amlodipine as this has happened previously (prior intolerance noted in chart). She does not want to take amlodipine anymore and refuses a trial with a lower dose. Per pt she never had issues with diltiazem before. Pt also reports her BP cuff started malfunctioning two days ago. In clinic, it appears cuff has issue with inflating properly - pt reports she is planning to go to [facility] where she purchased cuff to try and obtain a new one. Prior to this, most home readings were improved in the 140-150s in the morning before taking medications and 130-140s in the evening. Pt denies HA, blurry vision, dizziness, falls, or low BP readings. Pt reports occasional upper gastric pain but this has been ongoing. Pt reports continued compliance with physical activity and avoiding salt.  Current HTN meds: nebivolol 10mg  daily, spironolactone 25mg  daily, amlodipine 5mg  daily  Previously tried: amlodipine (muscle  soreness), chlorthalidone (muscle aches), HCTZ (sweating, itching), lisinopril (dizziness), olmesartan (HA), diltiazem (stopped by MD to change to amlodipine), clonidine 0.1mg  TID (pt self-discontinued)  BP goal: <140/90 mmHg - targeting more conservative goal given age  Family History: The patient's family history includes Cancer in her unknown relative; Coronary artery disease in her unknown relative; Diabetes in her father and unknown relative; Heart disease in her mother; Hypertension in her unknown relative; Lupus in her unknown relative; Stroke in her unknown relative.  Social History: never smoker, denies alcohol or illicit drug use  Diet: morning meal (~11:00) - cereal and banana, cheese sandwich; evening meal (~5:00) - salads with egg, cheese, spinach, broccoli; sometimes adds salt to food; avoids fried foods; eats cheetos occasionally; mostly cooks own meals. One cup of coffee/day.  Exercise: walks and does leg workouts every other day for 30 minutes   Home BP readings: Previous readings prior to cuff malfunctioning: 149/77 mmHg (am) and 131/88 mmHg (pm), 153/83 mmHg (am) and 144/82 mmHg (pm) - takes am readings before medications  Wt Readings from Last 3 Encounters:  04/02/17 174 lb 1.9 oz (79 kg)  03/03/17 176 lb (79.8 kg)  02/26/17 176 lb (79.8 kg)   BP Readings from Last 3 Encounters:  04/30/17 (!) 168/98  04/02/17 (!) 170/92  03/03/17 (!) 160/98   Pulse Readings from Last 3 Encounters:  04/30/17 64  04/02/17 68  03/03/17 (!) 57  Renal function: CrCl cannot be calculated (Unknown ideal weight.).  Past Medical History:  Diagnosis Date  . Cancer ([place])    right breast  . Diabetes mellitus    type 2. 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 Diverticulosis of colon   . DJD (degenerative joint disease) of knee    Left  . Hyperlipidemia   . Hypertension   . Osteoporosis   . Personal history of radiation therapy   . Positive PPD    history off      Current Outpatient Medications on File Prior to Visit  Medication Sig Dispense Refill  . amLODipine (NORVASC) 5 MG tablet Take 1 tablet (5 mg total) by mouth daily. 90 tablet 3  . aspirin EC 81 MG EC tablet Take 81 mg by mouth every other day.     . metFORMIN (GLUCOPHAGE) 500 MG tablet take 1 tablet by mouth once daily WITH BREAKFAST  0  . nebivolol (BYSTOLIC) 10 MG tablet Take 1 tablet (10 mg total) by mouth daily. 30 tablet 11  . potassium chloride SA (K-DUR,KLOR-CON) 20 MEQ tablet Take 1 tablet (20 mEq total) by mouth daily. 270 tablet 1  . spironolactone (ALDACTONE) 25 MG tablet Take 1 tablet (25 mg total) by mouth daily. 30 tablet 3   No current facility-administered medications on file prior to visit.     Allergies  Allergen Reactions  . Hctz [Hydrochlorothiazide] Other (See Comments)    Reactions: sweating, heart pain, itching  . Amlodipine Besylate     Muscle aches  . Chlorthalidone Other (See Comments)    Muscle aches  . Olmesartan     headache  . Alendronate Sodium     REACTION: heartburn  . Lisinopril     REACTION: dizziness     Assessment/Plan:  1. Hypertension - BP is improved but remains above goal <140/90 mmHg. Will stop amlodipine given patient's reported muscle pain that has recurred with rechallenge. Will continue nebivolol 10mg  daily and increase spironolactone to 50mg  daily. Will stop KCl supplementation with increased spironolactone dose and repeat BMET in 2 weeks. Pt will return to clinic in 4 weeks for a BP check. If BP remains uncontrolled, could consider further increase in spironolactone or nebivolol, or addition of hydralazine.  Arrie Senate, PharmD PGY-2 Cardiology Pharmacy Resident Pager: [phone]

## 2017-05-17 ENCOUNTER — Encounter (INDEPENDENT_AMBULATORY_CARE_PROVIDER_SITE_OTHER): Payer: Self-pay

## 2017-05-17 ENCOUNTER — Ambulatory Visit (INDEPENDENT_AMBULATORY_CARE_PROVIDER_SITE_OTHER): Payer: Medicare HMO | Admitting: Pharmacist

## 2017-05-17 VITALS — BP 148/82 | HR 72

## 2017-05-17 DIAGNOSIS — I10 Essential (primary) hypertension: Secondary | ICD-10-CM | POA: Diagnosis not present

## 2017-05-17 MED ORDER — SPIRONOLACTONE 50 MG PO TABS: 50.0000 mg | ORAL_TABLET | Freq: Every day | ORAL | 3 refills | Status: DC

## 2017-05-17 NOTE — Patient Instructions (Addendum)
It was great to see you today. Your blood pressure is improving!  Continue taking your nebivolol (Bystolic) 10mg  daily.  Stop taking the amlodipine and potassium chloride.  Increase your spironolactone to 50mg  daily. You can take two of the 25mg  tablets until you run out.  Return for blood work in 2 weeks on 06/01/17. Return to clinic for a blood pressure check in 4 weeks on 07/01/17.

## 2017-06-01 ENCOUNTER — Other Ambulatory Visit: Payer: Medicare HMO

## 2017-06-01 DIAGNOSIS — I10 Essential (primary) hypertension: Secondary | ICD-10-CM

## 2017-06-01 LAB — BASIC METABOLIC PANEL
BUN / CREAT RATIO: 15 (ref 12–28)
BUN: 13 mg/dL (ref 8–27)
CALCIUM: 9.7 mg/dL (ref 8.7–10.3)
CO2: 21 mmol/L (ref 20–29)
CREATININE: 0.84 mg/dL (ref 0.57–1.00)
Chloride: 105 mmol/L (ref 96–106)
GFR, EST AFRICAN AMERICAN: 73 mL/min/{1.73_m2} (ref >59)
GFR, EST NON AFRICAN AMERICAN: 63 mL/min/{1.73_m2} (ref >59)
Glucose: 101 mg/dL — ABNORMAL HIGH (ref 65–99)
Potassium: 4.6 mmol/L (ref 3.5–5.2)
Sodium: 139 mmol/L (ref 134–144)

## 2017-06-30 NOTE — Progress Notes (Signed)
Patient ID: Brittany [name]                 DOB: [birth year]                      MRN: [id number]     HPI: Brittany [name] is a 84 y.o. female patient of Dr. Tamala [name] who presents today for hypertension follow up. PMH significant for R breast cancer, allergic rhinitis, T2DM, OA L knee, HTN, and HLD. Pt has a history of multiple medication intolerances noted below and was referred to Dr. Tamala [name] for further management. At office visit with Dr. Tamala [name] 04/01/17 BP was 170/38mmHgso diltiazem was stopped, amlodipine was restarted, and pt was referred to HTN clinic. BMET was drawn showing slightincrease in SCr from 0.77 to 1.01, and K increased from 3.9 to4.7. At initial HTN clinic, pt was confused about medication regimen and states she had stopped taking her clonidine previously. At her most recent follow up in HTN clinic her spironolactone was increased and potassium supplementation stopped. BMET 2 weeks after increase revealed stable kidney function and unchanged potassium.   She presents today for follow up and states she was confused and thought that she was to see Dr. Tamala [name] today. She states she has done well on increased dose of spironolactone. She denies dizziness, SOB, and chest pain. 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   She did obtain a new cuff, but plans to return it as she does not like the way it works. She reports that her pressures of late have been in the 130s/80s, occasionally 140s, though she did not bring log with her today.    Current HTN meds: nebivolol 10mg  daily, spironolactone50mg  daily  Previously tried:amlodipine (muscle soreness), chlorthalidone (muscle aches), HCTZ (sweating, itching), lisinopril (dizziness), olmesartan (HA), diltiazem (stopped by MDto change to amlodipine), clonidine 0.1mg  TID (pt self-discontinued)  BP goal:<140/90 mmHg- targeting more conservative goal given age  Family History:The patient's family  history includes Cancer in her unknown relative; Coronary artery disease in her unknown relative; Diabetes in her father and unknown relative; Heart disease in her mother; Hypertension in her unknown relative; Lupus in her unknown relative; Stroke in her unknown relative.  Social History:never smoker, denies alcohol or illicit drug use  Diet:morning meal (~11:00) - cereal and banana, cheese sandwich; evening meal (~5:00) - salads with egg, cheese, spinach, broccoli; sometimes adds salt to food; avoids fried foods; eats cheetos occasionally; mostly cooks own meals. One cup of coffee/day.  Exercise:walks and does leg workouts every other day for 30 minutes  Home BP readings: see above.    Wt Readings from Last 3 Encounters:  04/02/17 174 lb 1.9 oz (79 kg)  03/03/17 176 lb (79.8 kg)  02/26/17 176 lb (79.8 kg)   BP Readings from Last 3 Encounters:  07/01/17 136/84  05/17/17 (!) 148/82  04/30/17 (!) 168/98   Pulse Readings from Last 3 Encounters:  07/01/17 62  05/17/17 72  04/30/17 64    Renal function: CrCl cannot be calculated (Patient's most recent lab result is older than the maximum 21 days allowed.).  Past Medical History:  Diagnosis Date  . Cancer ([place])    right breast  . Diabetes mellitus    type 2. 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 Diverticulosis of colon   . DJD (degenerative joint disease) of knee    Left  . Hyperlipidemia   .  Hypertension   . Osteoporosis   . Personal history of radiation therapy   . Positive PPD    history off    Current Outpatient Medications on File Prior to Visit  Medication Sig Dispense Refill  . aspirin EC 81 MG EC tablet Take 81 mg by mouth every other day.     . metFORMIN (GLUCOPHAGE) 500 MG tablet take 1 tablet by mouth once daily WITH BREAKFAST  0  . nebivolol (BYSTOLIC) 10 MG tablet Take 1 tablet (10 mg total) by mouth daily. 30 tablet 11  . spironolactone (ALDACTONE) 50 MG tablet Take 1 tablet (50 mg  total) daily by mouth. 30 tablet 3   No current facility-administered medications on file prior to visit.     Allergies  Allergen Reactions  . Hctz [Hydrochlorothiazide] Other (See Comments)    Reactions: sweating, heart pain, itching  . Amlodipine Besylate     Muscle aches  . Chlorthalidone Other (See Comments)    Muscle aches  . Olmesartan     headache  . Alendronate Sodium     REACTION: heartburn  . Lisinopril     REACTION: dizziness    Blood pressure 136/84, pulse 62, SpO2 98 %.   Assessment/Plan: Hypertension: BP at goal <140/90 today. Will continue Bystolic 10mg  daily and spironolactone 50mg  daily. She will record her home pressures and bring them to her follow up with Dr. Tamala [name] scheduled for 4 weeks from now. Follow up in HTN clinic as needed.    Thank you, Brittany [name], [facility]  07/01/2017 9:32 AM

## 2017-07-01 ENCOUNTER — Ambulatory Visit (INDEPENDENT_AMBULATORY_CARE_PROVIDER_SITE_OTHER): Payer: Medicare HMO | Admitting: Pharmacist

## 2017-07-01 VITALS — BP 136/84 | HR 62 | Wt 182.2 lb

## 2017-07-01 DIAGNOSIS — I10 Essential (primary) hypertension: Secondary | ICD-10-CM | POA: Diagnosis not present

## 2017-07-01 NOTE — Patient Instructions (Signed)
Return for a follow up appointment as scheduled  Check your blood pressure at home daily (if able) and keep record of the readings.  Take your BP meds as follows: Continue spironolactone 50mg  daily and Bystolic 10mg  daily   Bring all of your meds, your BP cuff and your record of home blood pressures to your next appointment.  Exercise as you're able, try to walk approximately 30 minutes per day.  Keep salt intake to a minimum, especially watch canned and prepared boxed foods.  Eat more fresh fruits and vegetables and fewer canned items.  Avoid eating in fast food restaurants.    HOW TO TAKE YOUR BLOOD PRESSURE: . Rest 5 minutes before taking your blood pressure. .  Don't smoke or drink caffeinated beverages for at least 30 minutes before. . Take your blood pressure before (not after) you eat. . Sit comfortably with your back supported and both feet on the floor (don't cross your legs). . Elevate your arm to heart level on a table or a desk. . Use the proper sized cuff. It should fit smoothly and snugly around your bare upper arm. There should be enough room to slip a fingertip under the cuff. The bottom edge of the cuff should be 1 inch above the crease of the elbow. . Ideally, take 3 measurements at one sitting and record the average.

## 2017-07-20 ENCOUNTER — Encounter: Payer: Self-pay | Admitting: Internal Medicine

## 2017-07-20 ENCOUNTER — Ambulatory Visit (INDEPENDENT_AMBULATORY_CARE_PROVIDER_SITE_OTHER): Payer: Medicare HMO | Admitting: Internal Medicine

## 2017-07-20 VITALS — BP 136/80 | HR 61 | Temp 97.6°F | Resp 16 | Ht 59.0 in | Wt 185.1 lb

## 2017-07-20 DIAGNOSIS — I10 Essential (primary) hypertension: Secondary | ICD-10-CM

## 2017-07-20 DIAGNOSIS — E118 Type 2 diabetes mellitus with unspecified complications: Secondary | ICD-10-CM

## 2017-07-20 NOTE — Progress Notes (Signed)
Subjective:  Patient ID: Brittany [name], female    DOB: 05/17/1929  Age: 84 y.o. MRN: [id number]  CC: Hypertension and Diabetes   HPI Brittany [name] presents for f/up -she tells me that her blood pressure has been well controlled.  She has felt well recently and offers no complaints.  Outpatient Medications Prior to Visit  Medication Sig Dispense Refill  . aspirin EC 81 MG EC tablet Take 81 mg by mouth every other day.     . nebivolol (BYSTOLIC) 10 MG tablet Take 1 tablet (10 mg total) by mouth daily. 30 tablet 11  . spironolactone (ALDACTONE) 50 MG tablet Take 1 tablet (50 mg total) daily by mouth. 30 tablet 3  . metFORMIN (GLUCOPHAGE) 500 MG tablet take 1 tablet by mouth once daily WITH BREAKFAST  0   No facility-administered medications prior to visit.     ROS Review of Systems  Constitutional: Negative.  Negative for appetite change, chills, diaphoresis and fatigue.  HENT: Negative.   Eyes: Negative.   Respiratory: Negative for cough, chest tightness and shortness of breath.   Cardiovascular: Negative for chest pain, palpitations and leg swelling.  Gastrointestinal: Negative for abdominal pain, diarrhea, nausea and vomiting.  Genitourinary: Negative.  Negative for difficulty urinating, dysuria and urgency.  Musculoskeletal: Negative.  Negative for arthralgias, back pain and myalgias.  Skin: Negative.   Allergic/Immunologic: Negative.   Neurological: Negative.  Negative for dizziness, weakness and light-headedness.  Hematological: Negative for adenopathy. Does not bruise/bleed easily.  Psychiatric/Behavioral: Negative.     Objective:  BP 136/80 (BP Location: Left Arm, Patient Position: Sitting, Cuff Size: Normal)   Pulse 61   Temp 97.6 F (36.4 C) (Oral)   Resp 16   Ht 4\' 11"  (1.499 m)   Wt 185 lb 1.3 oz (84 kg)   SpO2 99%   BMI 37.38 kg/m   BP Readings from Last 3 Encounters:  07/20/17 136/80  07/01/17 136/84  05/17/17 (!) 148/82    Wt Readings from Last 3  Encounters:  07/20/17 185 lb 1.3 oz (84 kg)  07/01/17 182 lb 3.2 oz (82.6 kg)  04/02/17 174 lb 1.9 oz (79 kg)    Physical Exam  Constitutional: She is oriented to person, place, and time. No distress.  HENT:  Mouth/Throat: Oropharynx is clear and moist. No oropharyngeal exudate.  Eyes: Conjunctivae are normal. Left eye exhibits no discharge. No scleral icterus.  Neck: Normal range of motion. Neck supple. No JVD present. No thyromegaly present.  Cardiovascular: Normal rate, regular rhythm and normal heart sounds.  No murmur heard. Pulmonary/Chest: Effort normal and breath sounds normal. No respiratory distress. She has no wheezes. She has no rales.  Abdominal: Soft. Bowel sounds are normal. There is no tenderness.  Musculoskeletal: Normal range of motion. She exhibits no edema or tenderness.  Lymphadenopathy:    She has no cervical adenopathy.  Neurological: She is alert and oriented to person, place, and time.  Skin: Skin is warm and dry. No rash noted. She is not diaphoretic. No erythema. No pallor.  Vitals reviewed.   Lab Results  Component Value Date   WBC 4.1 02/20/2017   HGB 13.1 02/20/2017   HCT 39.1 02/20/2017   PLT 330 02/20/2017   GLUCOSE 101 (H) 06/01/2017   CHOL 208 (H) 02/10/2017   TRIG 144.0 02/10/2017   HDL 66.00 02/10/2017   LDLDIRECT 119.8 04/02/2008   LDLCALC 113 (H) 02/10/2017   ALT 16 02/10/2017   AST 22 02/10/2017   NA  139 06/01/2017   K 4.6 06/01/2017   CL 105 06/01/2017   CREATININE 0.84 06/01/2017   BUN 13 06/01/2017   CO2 21 06/01/2017   TSH 2.41 02/10/2017   HGBA1C 5.8 02/10/2017   MICROALBUR 0.8 10/29/2016    No results found.  Assessment & Plan:   Brittany [name] was seen today for hypertension and diabetes.  Diagnoses and all orders for this visit:  Essential hypertension, benign- Her blood pressure is well controlled.  Type 2 diabetes mellitus with complication, without long-term current use of insulin ([place])- Her last A1c was at 5.8% so I  have asked her to stop taking metformin. -     Ambulatory referral to Ophthalmology   I have discontinued Brittany Lower. [name]'s metFORMIN. I am also having her maintain her aspirin EC, nebivolol, and spironolactone.  No orders of the defined types were placed in this encounter.    Follow-up: Return in about 6 months (around 01/17/2018).  Scarlette Calico, MD

## 2017-07-20 NOTE — Patient Instructions (Signed)

## 2017-08-05 ENCOUNTER — Other Ambulatory Visit: Payer: Self-pay

## 2017-08-05 DIAGNOSIS — I10 Essential (primary) hypertension: Secondary | ICD-10-CM

## 2017-08-05 MED ORDER — NEBIVOLOL HCL 10 MG PO TABS: 10.0000 mg | ORAL_TABLET | Freq: Every day | ORAL | 11 refills | Status: DC

## 2017-08-09 NOTE — Progress Notes (Addendum)
Cardiology Office Note    Date:  08/10/2017   ID:  Neka, Bise [birth year], MRN [id number]  PCP:  Janith Lima, MD  Cardiologist: Sinclair Grooms, MD   Chief Complaint  Patient presents with  . Follow-up    History of Present Illness:  Brittany [name] is a 84 y.o. female who is referred by Dr. Scarlette Calico for evaluation and help with management of hypertension. Previously seen by Dr. Peter Martinique. Was found to have diastolic dysfunction during prior evaluation.   Blood pressure has improved.  She is asymptomatic.  Initial blood pressure today 168/110 mmHg.  Repeat after rest 152/92 mmHg.     Past Medical History:  Diagnosis Date  . Cancer ([place])    right breast  . Diabetes mellitus    type 2. 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 Diverticulosis of colon   . DJD (degenerative joint disease) of knee    Left  . Hyperlipidemia   . Hypertension   . Osteoporosis   . Personal history of radiation therapy   . Positive PPD    history off    Past Surgical History:  Procedure Laterality Date  . BREAST LUMPECTOMY Right 2010  . BREAST SURGERY  2010   + RT    Current Medications: Outpatient Medications Prior to Visit  Medication Sig Dispense Refill  . aspirin EC 81 MG EC tablet Take 81 mg by mouth every other day.     . nebivolol (BYSTOLIC) 10 MG tablet Take 1 tablet (10 mg total) by mouth daily. 30 tablet 11  . spironolactone (ALDACTONE) 50 MG tablet Take 1 tablet (50 mg total) daily by mouth. 30 tablet 3   No facility-administered medications prior to visit.      Allergies:   Hctz [hydrochlorothiazide]; Amlodipine besylate; Chlorthalidone; Olmesartan; Alendronate sodium; and Lisinopril   Social History   Socioeconomic History  . Marital status: Widowed    Spouse name: None  . Number of children: 4  . Years of education: None  . Highest education level: None  Social Needs  . Financial resource strain: None  . Food insecurity - worry:  None  . Food insecurity - inability: None  . Transportation needs - medical: None  . Transportation needs - non-medical: None  Occupational History  . 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 Smoking status: Never Smoker  . Smokeless tobacco: Never Used  Substance and Sexual Activity  . Alcohol use: No  . Drug use: No  . Sexual activity: No  Other Topics Concern  . 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   ROS:   Please see the history of present illness.    Hearing loss, vision disturbance. All other systems reviewed and are negative.   PHYSICAL EXAM:   VS:  BP (!) 168/110   Pulse 73   Ht 4\' 6"  (1.372 m)   Wt 184 lb 1.9 oz (83.5 kg)   BMI 44.39 kg/m    152/92 mmHg GEN: Well nourished, well developed, in no acute distress  HEENT:  normal  Neck: no JVD, carotid bruits, or masses Cardiac: RRR; no murmurs, rubs, or gallops,no edema  Respiratory:  clear to auscultation bilaterally, normal work of breathing GI: soft, nontender, nondistended, + BS MS: no deformity or atrophy  Skin: warm and dry, no rash Neuro:  Alert and Oriented x 3, Strength and sensation are intact Psych: euthymic mood, full affect  Wt Readings from Last 3 Encounters:  08/10/17 184 lb 1.9 oz (83.5 kg)  07/20/17 185 lb 1.3 oz (84 kg)  07/01/17 182 lb 3.2 oz (82.6 kg)      Studies/Labs Reviewed:   EKG:  EKG not repeated  Recent Labs: 02/10/2017: ALT 16; TSH 2.41 02/20/2017: Hemoglobin 13.1; Platelets 330 06/01/2017: BUN 13; Creatinine, Ser 0.84; Potassium 4.6; Sodium 139   Lipid Panel    Component Value  Date/Time   CHOL 208 (H) 02/10/2017 1634   TRIG 144.0 02/10/2017 1634   TRIG 70 07/12/2006 0949   HDL 66.00 02/10/2017 1634   CHOLHDL 3 02/10/2017 1634   VLDL 28.8 02/10/2017 1634   LDLCALC 113 (H) 02/10/2017 1634   LDLDIRECT 119.8 04/02/2008 0000    Additional studies/ records that were reviewed today include:  No new data    ASSESSMENT:    1. Essential hypertension, benign   2. Type 2 diabetes mellitus with complication, without long-term current use of insulin ([place])   3. Hyperlipidemia with target LDL less than 100      PLAN:  In order of problems listed above:  1. At 84 years of age we should target blood pressure 150/90 mmHg or less.  She is having difficulty sleeping at night.  Current regimen of Aldactone and nebivolol are being tolerated.  Add clonidine 0.1 mg nightly.  Low-salt diet.  Blood pressure follow-up in 1 month with APP or in blood pressure clinic.  Clinical follow-up with me in 1 year.  If additional antihypertensive therapy is needed at next visit, would add a daytime dose of clonidine.    Medication Adjustments/Labs and Tests Ordered: Current medicines are reviewed at length with the patient today.  Concerns regarding medicines are outlined above.  Medication changes, Labs and Tests ordered today are listed in the Patient Instructions below. Patient Instructions  Medication Instructions:  Your physician has recommended you make the following change in your medication:   1) START - Clonidine 0.1 mg daily at bedtime   Labwork: None   Testing/Procedures: None    Follow-Up: Your physician recommends that you schedule a follow-up appointment in:   - 1 month with Hypertension Clinic  - 1 year with Dr. Tamala Julian    Any Other Special Instructions Will Be Listed Below (If Applicable).     If you need a refill on your cardiac medications before your next appointment, please call your pharmacy.      Signed, Sinclair Grooms, MD  08/10/2017 9:36  AM    [facility] [place], [place], [place]  [address] Phone: [phone]; Fax: [phone]

## 2017-08-10 ENCOUNTER — Ambulatory Visit: Payer: Medicare HMO | Admitting: Interventional Cardiology

## 2017-08-10 ENCOUNTER — Encounter: Payer: Self-pay | Admitting: Interventional Cardiology

## 2017-08-10 VITALS — BP 168/110 | HR 73 | Ht <= 58 in | Wt 184.1 lb

## 2017-08-10 DIAGNOSIS — I10 Essential (primary) hypertension: Secondary | ICD-10-CM | POA: Diagnosis not present

## 2017-08-10 DIAGNOSIS — E785 Hyperlipidemia, unspecified: Secondary | ICD-10-CM | POA: Diagnosis not present

## 2017-08-10 DIAGNOSIS — E118 Type 2 diabetes mellitus with unspecified complications: Secondary | ICD-10-CM

## 2017-08-10 MED ORDER — CLONIDINE HCL 0.1 MG PO TABS: 0.1000 mg | ORAL_TABLET | Freq: Every evening | ORAL | 3 refills | Status: DC

## 2017-08-10 NOTE — Patient Instructions (Signed)
Medication Instructions:  Your physician has recommended you make the following change in your medication:   1) START - Clonidine 0.1 mg daily at bedtime   Labwork: None   Testing/Procedures: None    Follow-Up: Your physician recommends that you schedule a follow-up appointment in:   - 1 month with Hypertension Clinic  - 1 year with Dr. Tamala Julian    Any Other Special Instructions Will Be Listed Below (If Applicable).     If you need a refill on your cardiac medications before your next appointment, please call your pharmacy.

## 2017-09-07 ENCOUNTER — Encounter: Payer: Self-pay | Admitting: Pharmacist

## 2017-09-07 ENCOUNTER — Ambulatory Visit (INDEPENDENT_AMBULATORY_CARE_PROVIDER_SITE_OTHER): Payer: Medicare HMO | Admitting: Pharmacist

## 2017-09-07 VITALS — BP 142/86 | HR 63

## 2017-09-07 DIAGNOSIS — I10 Essential (primary) hypertension: Secondary | ICD-10-CM | POA: Diagnosis not present

## 2017-09-07 MED ORDER — NEBIVOLOL HCL 10 MG PO TABS: 10.0000 mg | ORAL_TABLET | Freq: Every day | ORAL | 11 refills | Status: DC

## 2017-09-07 NOTE — Patient Instructions (Signed)
CONTINUE nebivolol 10mg  once daily and spironolactone 50mg  daily in the morning AND clonidine 0.1mg  once daily in the evening  Our blood pressure goal for you is less than 140/90. Please call if your pressures run above this on your new cuff.   Our phone number is [phone].

## 2017-09-07 NOTE — Progress Notes (Signed)
Patient ID: Brittany [name]                 DOB: [birth year]                      MRN: [id number]     HPI: Brittany [name] is a 84 y.o. female patient of Dr. Tamala Julian who presents today for hypertension follow up. PMH significant for R breast cancer, allergic rhinitis, T2DM, OA L knee, HTN, and HLD. Pt has a history of multiple medication intolerances noted below and was referred to Dr. Tamala Julian for further management. At office visit with Dr. Tamala Julian 04/01/17 BP was 170/92mmHgso diltiazem was stopped, amlodipine was restarted, and pt was referred to HTN clinic. BMET was drawn showing slightincrease in SCr from 0.77 to 1.01, and K increased from 3.9 to4.7. At initial HTN clinic, pt was confused about medication regimen and states she had stopped taking her clonidine previously. Her spironolactone has since been titrated. Since her last HTN clinic visit she has seen Dr. Tamala Julian and her pressure was markedly elevated. She was started on clonidine 0.1mg  QHS to help with sleep and pressures with plan to add additional clonidine dose if pressures not controlled.   She presents today for follow up and states she has not checked her pressures since her last visit. She states she has been doing well on new medication. She denies chest pain, SOB, dizziness, and head ache. She states she has been sleeping better with new medication.   Current HTN meds: nebivolol 10mg  daily, spironolactone50mg  daily in the morning, clonidine 0.1mg  at night.   Previously tried:amlodipine (muscle soreness), chlorthalidone (muscle aches), HCTZ (sweating, itching), lisinopril (dizziness), olmesartan (HA), diltiazem (stopped by MDto change to amlodipine), clonidine 0.1mg  TID (pt self-discontinued)  BP goal:<140/90 mmHg- targeting more conservative goal given age  Family History:The patient's family history includes Cancer in her unknown relative; Coronary artery disease in her unknown relative; Diabetes in her father and unknown  relative; Heart disease in her mother; Hypertension in her unknown relative; Lupus in her unknown relative; Stroke in her unknown relative.  Social History:never smoker, denies alcohol or illicit drug use  Diet:morning meal (~11:00) - cereal and banana, cheese sandwich; evening meal (~5:00) - salads with egg, cheese, spinach, broccoli; sometimes adds salt to food; avoids fried foods; eats cheetos occasionally; mostly cooks own meals. One cup of coffee/day.  Exercise:walks and does leg workouts every other day for 30 minutes  Home BP readings: has not checked since visit with Dr. Tamala Julian.   Wt Readings from Last 3 Encounters:  08/10/17 184 lb 1.9 oz (83.5 kg)  07/20/17 185 lb 1.3 oz (84 kg)  07/01/17 182 lb 3.2 oz (82.6 kg)   BP Readings from Last 3 Encounters:  09/07/17 (!) 142/86  08/10/17 (!) 168/110  07/20/17 136/80   Pulse Readings from Last 3 Encounters:  09/07/17 63  08/10/17 73  07/20/17 61    Renal function: CrCl cannot be calculated (Patient's most recent lab result is older than the maximum 21 days allowed.).  Past Medical History:  Diagnosis Date  . Cancer ([place])    right breast  . Diabetes mellitus    type 2. 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 Diverticulosis of colon   . DJD (degenerative joint disease) of knee    Left  . Hyperlipidemia   . Hypertension   . Osteoporosis   . Personal history of radiation therapy   . Positive PPD  history off    Current Outpatient Medications on File Prior to Visit  Medication Sig Dispense Refill  . aspirin EC 81 MG EC tablet Take 81 mg by mouth every other day.     . cloNIDine (CATAPRES) 0.1 MG tablet Take 1 tablet (0.1 mg total) by mouth every evening. 90 tablet 3  . spironolactone (ALDACTONE) 50 MG tablet Take 1 tablet (50 mg total) daily by mouth. 30 tablet 3   No current facility-administered medications on file prior to visit.     Allergies  Allergen Reactions  . Hctz  [Hydrochlorothiazide] Other (See Comments)    Reactions: sweating, heart pain, itching  . Amlodipine Besylate     Muscle aches  . Chlorthalidone Other (See Comments)    Muscle aches  . Olmesartan     headache  . Alendronate Sodium     REACTION: heartburn  . Lisinopril     REACTION: dizziness    Blood pressure (!) 142/86, pulse 63, SpO2 99 %.   Assessment/Plan: Hypertension: BP is borderline at goal <140/90 today and improved with the addition of clonidine 0.1mg  QHS. Will continue current regimen and advised to monitor at home. Follow up in 2-3 months in HTN clinic or sooner if needed.    Thank you, Lelan Pons. Patterson Hammersmith, [facility]  09/07/2017 9:08 AM

## 2017-09-21 ENCOUNTER — Other Ambulatory Visit: Payer: Self-pay | Admitting: Interventional Cardiology

## 2017-09-21 DIAGNOSIS — I10 Essential (primary) hypertension: Secondary | ICD-10-CM

## 2017-09-24 DIAGNOSIS — H25011 Cortical age-related cataract, right eye: Secondary | ICD-10-CM | POA: Diagnosis not present

## 2017-09-24 DIAGNOSIS — H2511 Age-related nuclear cataract, right eye: Secondary | ICD-10-CM | POA: Diagnosis not present

## 2017-09-24 DIAGNOSIS — H524 Presbyopia: Secondary | ICD-10-CM | POA: Diagnosis not present

## 2017-09-24 DIAGNOSIS — E119 Type 2 diabetes mellitus without complications: Secondary | ICD-10-CM | POA: Diagnosis not present

## 2017-09-29 ENCOUNTER — Telehealth: Payer: Self-pay | Admitting: Interventional Cardiology

## 2017-09-29 NOTE — Telephone Encounter (Signed)
Request for surgical clearance:  1. What type of surgery is being performed? Cataract surgery   2. When is this surgery scheduled? TBD   3. Are there any medications that need to be held prior to surgery and how long? ASA if needed   4. Name of physician performing surgery?  Dr. Satira Sark with [facility]   5. What is your office phone and fax number?  [phone] Fax [phone]

## 2017-10-01 NOTE — Telephone Encounter (Signed)
   Primary Cardiologist: Sinclair Grooms, MD  Chart reviewed as part of pre-operative protocol coverage. Patient was contacted 10/01/2017 in reference to pre-operative risk assessment for pending surgery as outlined below.  Brittany [name] was last seen on 08/10/2017 by Dr. Tamala Julian.  Since that day, Brittany [name] has done well.  She is not having any cardiac symptoms.  She denies chest pain or shortness of breath.  Therefore, based on ACC/AHA guidelines, the patient would be at acceptable risk for the planned procedure without further cardiovascular testing.   It is okay to hold the aspirin for 5 days if needed.  I will route this recommendation to the requesting party via Epic fax function and remove from pre-op pool.  Please call with questions.  Rosaria Ferries, PA-C 10/01/2017, 4:32 PM

## 2017-10-04 NOTE — Telephone Encounter (Signed)
Resent clearance letter to Dr Mellissa Kohut office.

## 2017-11-04 DIAGNOSIS — H25011 Cortical age-related cataract, right eye: Secondary | ICD-10-CM | POA: Diagnosis not present

## 2017-11-04 DIAGNOSIS — H25811 Combined forms of age-related cataract, right eye: Secondary | ICD-10-CM | POA: Diagnosis not present

## 2017-11-04 DIAGNOSIS — H2511 Age-related nuclear cataract, right eye: Secondary | ICD-10-CM | POA: Diagnosis not present

## 2017-11-16 ENCOUNTER — Ambulatory Visit (INDEPENDENT_AMBULATORY_CARE_PROVIDER_SITE_OTHER): Payer: Medicare HMO | Admitting: Pharmacist

## 2017-11-16 ENCOUNTER — Encounter: Payer: Self-pay | Admitting: Pharmacist

## 2017-11-16 VITALS — BP 144/86 | HR 70

## 2017-11-16 DIAGNOSIS — I10 Essential (primary) hypertension: Secondary | ICD-10-CM

## 2017-11-16 NOTE — Progress Notes (Signed)
Patient ID: Brittany [name]                 DOB: Nov 26, 1928                      MRN: [id number]     HPI: Brittany [name] is a 84 y.o. female patient of Dr. Tamala [name] who presents today for hypertension follow up. PMH significant for R breast cancer, allergic rhinitis, T2DM, OA L knee, HTN, and HLD. Pt has a history of multiple medication intolerances noted below and was referred to Dr. Tamala [name] for further management. At office visit with Dr. Tamala [name] 04/01/17 BP was 170/16mmHgso diltiazem was stopped, amlodipine was restarted, and pt was referred to HTN clinic. BMET was drawn showing slightincrease in SCr from 0.77 to 1.01, and K increased from 3.9 to4.7. At initial HTN clinic, pt was confused about medication regimen and states she had stopped taking her clonidine previously. Her spironolactone has since been titrated. At her last HTN clinic visit no medication changes were made as pressures were borderline at goal. The plan was to add a dose of clonidine if needed for pressure control.   She presents today for follow up. She had a cataract removal. She denies chest pain, SOB, dizziness, and headache. She states she thinks her blood pressure is high as she has been without Bystolic for 3 days. The pharmacy has been unable to fill for her as they needed to order the medication.   Current HTN meds: nebivolol 10mg  daily, spironolactone50mg  daily in the morning, clonidine 0.1mg  at night.   Previously tried:amlodipine (muscle soreness), chlorthalidone (muscle aches), HCTZ (sweating, itching), lisinopril (dizziness), olmesartan (HA), diltiazem (stopped by MDto change to amlodipine), clonidine 0.1mg  TID (pt self-discontinued)  BP goal:<140/90 mmHg- targeting more conservative goal given age  Family History:The patient's family history includes Cancer in her unknown relative; Coronary artery disease in her unknown relative; Diabetes in her father and unknown relative; Heart disease in her mother;  Hypertension in her unknown relative; Lupus in her unknown relative; Stroke in her unknown relative.  Social History:never smoker, denies alcohol or illicit drug use  Diet:morning meal (~11:00) - cereal and banana, cheese sandwich; evening meal (~5:00) - salads with egg, cheese, spinach, broccoli; sometimes adds salt to food; avoids fried foods; eats cheetos occasionally; mostly cooks own meals. One cup of coffee/day.  Exercise:walks and does leg workouts every other day for 30 minutes  Home BP readings: Has not checked much this week. 130s-140s/80s she believes this is about where she has been   Wt Readings from Last 3 Encounters:  08/10/17 184 lb 1.9 oz (83.5 kg)  07/20/17 185 lb 1.3 oz (84 kg)  07/01/17 182 lb 3.2 oz (82.6 kg)   BP Readings from Last 3 Encounters:  11/16/17 (!) 144/86  09/07/17 (!) 142/86  08/10/17 (!) 168/110   Pulse Readings from Last 3 Encounters:  11/16/17 70  09/07/17 63  08/10/17 73    Renal function: CrCl cannot be calculated (Patient's most recent lab result is older than the maximum 21 days allowed.).  Past Medical History:  Diagnosis Date  . Cancer ([place])    right breast  . Diabetes mellitus    type 2. 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 Diverticulosis of colon   . DJD (degenerative joint disease) of knee    Left  . Hyperlipidemia   . Hypertension   . Osteoporosis   . Personal history of radiation therapy   .  Positive PPD    history off    Current Outpatient Medications on File Prior to Visit  Medication Sig Dispense Refill  . aspirin EC 81 MG EC tablet Take 81 mg by mouth every other day.     . cloNIDine (CATAPRES) 0.1 MG tablet Take 1 tablet (0.1 mg total) by mouth every evening. 90 tablet 3  . spironolactone (ALDACTONE) 50 MG tablet Take 1 tablet (50 mg total) by mouth daily. 90 tablet 3  . nebivolol (BYSTOLIC) 10 MG tablet Take 1 tablet (10 mg total) by mouth daily. 30 tablet 11   No current  facility-administered medications on file prior to visit.     Allergies  Allergen Reactions  . Hctz [Hydrochlorothiazide] Other (See Comments)    Reactions: sweating, heart pain, itching  . Amlodipine Besylate     Muscle aches  . Chlorthalidone Other (See Comments)    Muscle aches  . Olmesartan     headache  . Alendronate Sodium     REACTION: heartburn  . Lisinopril     REACTION: dizziness    Blood pressure (!) 144/86, pulse 70, SpO2 98 %.   Assessment/Plan: Hypertension: BP is borderline at goal <140/90 today likely secondary to missed doses of Bystolic. Will restart Bystolic 10mg  daily and continue spironolactone 50mg  and clonidine 0.1mg  daily. Advised she try automatic refill or ordering medications a week before out so that she does not go without medications. Follow up in HTN clinic in 2-3 months to assess pressure on medications.    Thank you, Brittany [name]. Brittany [name], [facility]  11/16/2017 8:48 AM

## 2017-11-16 NOTE — Patient Instructions (Addendum)
RESTART Bystolic 10mg  daily   CONTINUE spironolactone 50mg  daily and clonidine 0.1mg  daily in the evening  Try refilling your medications with automatic refill or call when you have about 5-7 days left of your medications.

## 2017-12-09 DIAGNOSIS — H26492 Other secondary cataract, left eye: Secondary | ICD-10-CM | POA: Diagnosis not present

## 2017-12-09 LAB — HM DIABETES EYE EXAM

## 2017-12-14 ENCOUNTER — Telehealth: Payer: Self-pay | Admitting: Emergency Medicine

## 2017-12-14 NOTE — Telephone Encounter (Signed)
Called patient to schedule AWV. Patient declined at this time. 

## 2018-01-18 ENCOUNTER — Other Ambulatory Visit: Payer: Self-pay | Admitting: Internal Medicine

## 2018-01-18 DIAGNOSIS — Z1231 Encounter for screening mammogram for malignant neoplasm of breast: Secondary | ICD-10-CM

## 2018-01-27 ENCOUNTER — Ambulatory Visit (INDEPENDENT_AMBULATORY_CARE_PROVIDER_SITE_OTHER): Payer: Medicare HMO | Admitting: Family

## 2018-01-27 ENCOUNTER — Encounter: Payer: Self-pay | Admitting: Family

## 2018-01-27 ENCOUNTER — Ambulatory Visit: Payer: Self-pay

## 2018-01-27 VITALS — BP 140/80 | HR 70 | Temp 98.2°F | Ht <= 58 in | Wt 182.0 lb

## 2018-01-27 DIAGNOSIS — J309 Allergic rhinitis, unspecified: Secondary | ICD-10-CM

## 2018-01-27 DIAGNOSIS — R21 Rash and other nonspecific skin eruption: Secondary | ICD-10-CM

## 2018-01-27 NOTE — Telephone Encounter (Signed)
rec'd call from pt.  Stated she started itching on anterior neck 2 days ago.  Reported the itching seemed to correlate with taking her Bystolic.  Reported has been on Bystolic for awhile.  Reported she broke out in fine, raised red rash today that itches.  Denied swelling of lips, tongue, or throat. Denied rash or itching elsewhere on body.  Denied any change in medication.  Voiced concern if she should stop Bystolic.  Advised not to stop Bystolic. Appt. scheduled today at 4:00 PM for evaluation.  Pt. Agrees with plan.    Reason for Disposition . [1] Localized rash is very painful AND [2] no fever    Denied pain, but c/o itching and fine red rash anterior neck  Answer Assessment - Initial Assessment Questions 1.) CALLER DIAGNOSIS: "What do you think is causing the rash?" (e.g., Chickenpox, Hives, Impetigo, Athlete's Foot, etc.)  Itching and rash on neck, below the chin about 2 days  2.) LOCATION:  "Is it widespread or localized?"  Localized on neck; fine raised bumps   3.) NEW MEDICATIONS: "Are you taking any new medicine?" On Bystolic for awhile; thinks this has recently caused the symptoms, because the itching would start after the Bystolic dose.  Answer Assessment - Initial Assessment Questions 1. APPEARANCE of RASH: "Describe the rash."      On neck; fine raised rash  2. LOCATION: "Where is the rash located?"      Anterior neck  3. NUMBER: "How many spots are there?"      Multiple small bumps 4. SIZE: "How big are the spots?" (Inches, centimeters or compare to size of a coin)      Fine rash 5. ONSET: "When did the rash start?"      Today 6. ITCHING: "Does the rash itch?" If so, ask: "How bad is the itch?"  (Scale 1-10; or mild, moderate, severe)     mild 7. PAIN: "Does the rash hurt?" If so, ask: "How bad is the pain?"  (Scale 1-10; or mild, moderate, severe)     Denied  8. OTHER SYMPTOMS: "Do you have any other symptoms?" (e.g., fever)     Denied fever/ chills 9. PREGNANCY: "Is  there any chance you are pregnant?" "When was your last menstrual period?"    N/a  Protocols used: RASH OR REDNESS - [place], [employer]

## 2018-01-28 NOTE — Progress Notes (Signed)
Brittany [name] is a 84 y.o. female with the following history as recorded in EpicCare:  Patient Active Problem List   Diagnosis Date Noted  . Vitamin D deficiency 04/15/2016  . Anemia, iron deficiency 04/15/2016  . Middle insomnia 04/15/2016  . Routine general medical examination at a health care facility 12/25/2013  . Allergic rhinitis, cause unspecified 04/15/2011  . Type II diabetes mellitus with manifestations ([place]) 05/05/2007  . Hyperlipidemia with target LDL less than 100 05/05/2007  . Essential hypertension, benign 05/05/2007  . OSTEOPOROSIS 05/05/2007  . Hx Breast Cancer ([facility]) right, receptor + 08/25/2006    Class: Stage 1    Current Outpatient Medications  Medication Sig Dispense Refill  . aspirin EC 81 MG EC tablet Take 81 mg by mouth every other day.     . cloNIDine (CATAPRES) 0.1 MG tablet Take 1 tablet (0.1 mg total) by mouth every evening. 90 tablet 3  . nebivolol (BYSTOLIC) 10 MG tablet Take 1 tablet (10 mg total) by mouth daily. 30 tablet 11  . spironolactone (ALDACTONE) 50 MG tablet Take 1 tablet (50 mg total) by mouth daily. 90 tablet 3   No current facility-administered medications for this visit.     Allergies: Hctz [hydrochlorothiazide]; Amlodipine besylate; Chlorthalidone; Olmesartan; Alendronate sodium; and Lisinopril  Past Medical History:  Diagnosis Date  . Cancer ([place])    right breast  . Diabetes mellitus    type 2. 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 Diverticulosis of colon   . DJD (degenerative joint disease) of knee    Left  . Hyperlipidemia   . Hypertension   . Osteoporosis   . Personal history of radiation therapy   . Positive PPD    history off    Past Surgical History:  Procedure Laterality Date  . BREAST LUMPECTOMY Right 2010  . BREAST SURGERY  2010   + RT    Family History  Problem Relation Age of Onset  . Heart disease Mother   . Diabetes Father   . Diabetes Unknown        1st degree relative  . Hypertension  Unknown   . Stroke Unknown   . Cancer Unknown        cervical  . Lupus Unknown   . Coronary artery disease Unknown     Social History   Tobacco Use  . Smoking status: Never Smoker  . Smokeless tobacco: Never Used  Substance Use Topics  . Alcohol use: No    Subjective:  Patient notes she started with an "itchy rash" on the ride side of her neck this morning; denies any new soaps, foods, detergents or medications. Is concerned that the reaction is due to her Bystolic because the rash seemed to start after she took this particular medication; denies any difficulty swallowing or difficulty breathing; rash is not seen on any other part of the body; has been using alcohol to "dry up" the rash with good relief today; has been stable on Bystolic for at least 2-3 years.     Objective:  Vitals:   01/27/18 1600  BP: 140/80  Pulse: 70  Temp: 98.2 F (36.8 C)  TempSrc: Oral  SpO2: 96%  Weight: 182 lb (82.6 kg)  Height: 4\' 6"  (1.372 m)    General: Well developed, well nourished, in no acute distress  Skin : Warm and dry. Localized papular rash noted on right side of patient's neck; no rash noted on trunk or extremities Head: Normocephalic and atraumatic  Lungs: Respirations unlabored; clear to auscultation bilaterally without wheeze, rales, rhonchi  Neurologic: Alert and oriented; speech intact; face symmetrical; moves all extremities well; CNII-XII intact without focal deficit   Assessment:  1. Rash   2. Allergic rhinitis     Plan:  Low suspicion that symptoms are related to Bystolic as rash is localized/ she has been stable on this medication " x years." She feels that her symptoms are actually improving since she made the appointment; defers any medication at this time; follow-up as needed.   No follow-ups on file.  No orders of the defined types were placed in this encounter.   Requested Prescriptions    No prescriptions requested or ordered in this encounter

## 2018-02-10 ENCOUNTER — Ambulatory Visit (INDEPENDENT_AMBULATORY_CARE_PROVIDER_SITE_OTHER): Payer: Medicare HMO | Admitting: Pharmacist

## 2018-02-10 ENCOUNTER — Encounter: Payer: Self-pay | Admitting: Pharmacist

## 2018-02-10 VITALS — BP 132/82 | HR 62

## 2018-02-10 DIAGNOSIS — I10 Essential (primary) hypertension: Secondary | ICD-10-CM

## 2018-02-10 MED ORDER — NEBIVOLOL HCL 10 MG PO TABS: 10.0000 mg | ORAL_TABLET | Freq: Every day | ORAL | 3 refills | Status: DC

## 2018-02-10 MED ORDER — CLONIDINE HCL 0.1 MG PO TABS: 0.1000 mg | ORAL_TABLET | Freq: Every evening | ORAL | 3 refills | Status: DC

## 2018-02-10 NOTE — Patient Instructions (Addendum)
Blood work today - BMET   Check your blood pressure at home daily (if able) and keep record of the readings.  Take your BP meds as follows: CONTINUE all medications as prescribed  Call the clinic if the rash worsens or you have any issues with your blood pressure.   Bring all of your meds, your BP cuff and your record of home blood pressures to your next appointment.  Exercise as you're able, try to walk approximately 30 minutes per day.  Keep salt intake to a minimum, especially watch canned and prepared boxed foods.  Eat more fresh fruits and vegetables and fewer canned items.  Avoid eating in fast food restaurants.    HOW TO TAKE YOUR BLOOD PRESSURE: . Rest 5 minutes before taking your blood pressure. .  Don't smoke or drink caffeinated beverages for at least 30 minutes before. . Take your blood pressure before (not after) you eat. . Sit comfortably with your back supported and both feet on the floor (don't cross your legs). . Elevate your arm to heart level on a table or a desk. . Use the proper sized cuff. It should fit smoothly and snugly around your bare upper arm. There should be enough room to slip a fingertip under the cuff. The bottom edge of the cuff should be 1 inch above the crease of the elbow. . Ideally, take 3 measurements at one sitting and record the average.

## 2018-02-10 NOTE — Progress Notes (Signed)
Patient ID: Brittany [name]                 DOB: 08/29/1928                      MRN: [id number]     HPI: Brittany [name] is a 84 y.o. female patient of Dr. Tamala Julian who presents today for hypertension follow up. PMH significant for R breast cancer, allergic rhinitis, T2DM, OA L knee, HTN, and HLD. Pt has a history of multiple medication intolerances noted below and was referred to Dr. Tamala Julian for further management. At office visit with Dr. Tamala Julian 04/01/17 BP was 170/73mmHgso diltiazem was stopped, amlodipine was restarted, and pt was referred to HTN clinic. BMET was drawn showing slightincrease in SCr from 0.77 to 1.01, and K increased from 3.9 to4.7. At initial HTN clinic, pt was confused about medication regimen and states she had stopped taking her clonidine previously. Her spironolactone has since been titrated. At her last HTN clinic visit no medication changes were made as pressures were borderline at goal and she had been without Bystolic for a few days. The plan was to add a dose of clonidine if needed for pressure control.   She presents today for follow up. She did have a rash that she thinks may be related to her Bystolic. It was localized to under her breast for red. She reports it was everywhere. She states that her skin still itches all over, but is improving. She states she tried to take the Bystolic by itself and continued to itch all over. Otherwise she seems to be doing well.  Current HTN meds: nebivolol 10mg  daily, spironolactone50mg  daily in the morning, clonidine 0.1mg  at night.   Previously tried:amlodipine (muscle soreness), chlorthalidone (muscle aches), HCTZ (sweating, itching), lisinopril (dizziness), olmesartan (HA), diltiazem (stopped by MDto change to amlodipine), clonidine 0.1mg  TID (pt self-discontinued)  BP goal:<140/90 mmHg- targeting more conservative goal given age  Family History:The patient's family history includes Cancer in her unknown relative; Coronary  artery disease in her unknown relative; Diabetes in her father and unknown relative; Heart disease in her mother; Hypertension in her unknown relative; Lupus in her unknown relative; Stroke in her unknown relative.  Social History:never smoker, denies alcohol or illicit drug use  Diet:morning meal (~11:00) - cereal and banana, cheese sandwich; evening meal (~5:00) - salads with egg, cheese, spinach, broccoli; sometimes adds salt to food; avoids fried foods; eats cheetos occasionally; mostly cooks own meals. One cup of coffee/day.  Exercise:walks and does leg workouts every other day for 30 minutes  Home BP readings: 144/unknown.   Wt Readings from Last 3 Encounters:  01/27/18 182 lb (82.6 kg)  08/10/17 184 lb 1.9 oz (83.5 kg)  07/20/17 185 lb 1.3 oz (84 kg)   BP Readings from Last 3 Encounters:  02/10/18 132/82  01/27/18 140/80  11/16/17 (!) 144/86   Pulse Readings from Last 3 Encounters:  02/10/18 62  01/27/18 70  11/16/17 70    Renal function: CrCl cannot be calculated (Patient's most recent lab result is older than the maximum 21 days allowed.).  Past Medical History:  Diagnosis Date  . Cancer ([place])    right breast  . Diabetes mellitus    type 2. 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 Diverticulosis of colon   . DJD (degenerative joint disease) of knee    Left  . Hyperlipidemia   . Hypertension   . Osteoporosis   .  Personal history of radiation therapy   . Positive PPD    history off    Current Outpatient Medications on File Prior to Visit  Medication Sig Dispense Refill  . spironolactone (ALDACTONE) 50 MG tablet Take 1 tablet (50 mg total) by mouth daily. 90 tablet 3  . aspirin EC 81 MG EC tablet Take 81 mg by mouth every other day.      No current facility-administered medications on file prior to visit.     Allergies  Allergen Reactions  . Hctz [Hydrochlorothiazide] Other (See Comments)    Reactions: sweating, heart pain, itching  .  Amlodipine Besylate     Muscle aches  . Chlorthalidone Other (See Comments)    Muscle aches  . Olmesartan     headache  . Alendronate Sodium     REACTION: heartburn  . Lisinopril     REACTION: dizziness    Blood pressure 132/82, pulse 62, SpO2 98 %.   Assessment/Plan: Hypertension: BP today is controlled and at goal<140/90. BMET today. Continue medications as prescribed. Continue to monitor rash and call with changes/concerns. Follow up with Dr. Tamala Julian as scheduled and HTN clinic as needed.    Thank you, Lelan Pons. Patterson Hammersmith, [facility]  02/10/2018 1:01 PM

## 2018-02-11 LAB — BASIC METABOLIC PANEL
BUN/Creatinine Ratio: 9 — ABNORMAL LOW (ref 12–28)
BUN: 8 mg/dL (ref 8–27)
CALCIUM: 9.7 mg/dL (ref 8.7–10.3)
CHLORIDE: 107 mmol/L — AB (ref 96–106)
CO2: 20 mmol/L (ref 20–29)
CREATININE: 0.94 mg/dL (ref 0.57–1.00)
GFR calc Af Amer: 63 mL/min/{1.73_m2} (ref >59)
GFR calc non Af Amer: 55 mL/min/{1.73_m2} — ABNORMAL LOW (ref >59)
GLUCOSE: 107 mg/dL — AB (ref 65–99)
Potassium: 4.5 mmol/L (ref 3.5–5.2)
Sodium: 142 mmol/L (ref 134–144)

## 2018-02-15 ENCOUNTER — Ambulatory Visit: Payer: Self-pay | Admitting: *Deleted

## 2018-02-15 NOTE — Telephone Encounter (Signed)
Pt saw L. Murray for rash and itching 01/27/18. Pt reports itching has not resolved. Rash present mostly under breasts, right lower leg and right shoulder blade area, diffuse, not raised. States rash is red, then goes away and turns dark. "Then one reappears some place else." States itching at 3/10, worse at times. Pt requesting appt with [name] only, which agent made prior to NT. Appt with [name] on 03/01/18.  Pt is requesting something for the rash and itching.  Also questioning if she should stop the Bystolic. Pt states she believes it is coming from that medication.  Please advise: [phone]  States please leave message if no answer. Reason for Disposition . Mild widespread rash  Answer Assessment - Initial Assessment Questions 1. APPEARANCE of RASH: "Describe the rash." (e.g., spots, blisters, raised areas, skin peeling, scaly)     Flat, bright red, turns dark 2. SIZE: "How big are the spots?" (e.g., tip of pen, eraser, coin; inches, centimeters)     Large 3. LOCATION: "Where is the rash located?"     Lower right leg, under breasts right and right shoulder blade 4. COLOR: "What color is the rash?" (Note: It is difficult to assess rash color in people with darker-colored skin. When this situation occurs, simply ask the caller to describe what they see.)    Red, then disappears and area turns dark. 5. ONSET: "When did the rash begin?"     Saw L. Valere Dross 01/27/18 6. FEVER: "Do you have a fever?" If so, ask: "What is your temperature, how was it measured, and when did it start?"     no 7. ITCHING: "Does the rash itch?" If so, ask: "How bad is the itch?" (Scale 1-10; or mild, moderate, severe)    3/10, intermittent. Worse at times 8. CAUSE: "What do you think is causing the rash?"     Bystolic 9. MEDICATION FACTORS: "Have you started any new medications within the last 2 weeks?" (e.g., antibiotics)      no 10. OTHER SYMPTOMS: "Do you have any other symptoms?" (e.g., dizziness, headache,  sore throat, joint pain)       no  Protocols used: RASH OR REDNESS - [facility]

## 2018-02-16 NOTE — Telephone Encounter (Signed)
Appointment scheduled.  Called patient and left message letting her know of appointment day and time.

## 2018-02-16 NOTE — Telephone Encounter (Signed)
Can we get her in next Tuesday?

## 2018-02-17 ENCOUNTER — Ambulatory Visit
Admission: RE | Admit: 2018-02-17 | Discharge: 2018-02-17 | Disposition: A | Discharge status: Home / Self Care | Payer: Medicare HMO | Source: Ambulatory Visit | Attending: Internal Medicine | Admitting: Internal Medicine

## 2018-02-17 DIAGNOSIS — Z1231 Encounter for screening mammogram for malignant neoplasm of breast: Secondary | ICD-10-CM

## 2018-02-17 LAB — HM MAMMOGRAPHY

## 2018-02-22 ENCOUNTER — Encounter: Payer: Self-pay | Admitting: Internal Medicine

## 2018-02-22 ENCOUNTER — Ambulatory Visit (INDEPENDENT_AMBULATORY_CARE_PROVIDER_SITE_OTHER): Payer: Medicare HMO | Admitting: Internal Medicine

## 2018-02-22 ENCOUNTER — Other Ambulatory Visit (INDEPENDENT_AMBULATORY_CARE_PROVIDER_SITE_OTHER): Payer: Medicare HMO

## 2018-02-22 VITALS — BP 140/90 | HR 63 | Temp 97.9°F | Resp 16 | Ht <= 58 in | Wt 179.2 lb

## 2018-02-22 DIAGNOSIS — D508 Other iron deficiency anemias: Secondary | ICD-10-CM | POA: Diagnosis not present

## 2018-02-22 DIAGNOSIS — E785 Hyperlipidemia, unspecified: Secondary | ICD-10-CM | POA: Diagnosis not present

## 2018-02-22 DIAGNOSIS — L299 Pruritus, unspecified: Secondary | ICD-10-CM

## 2018-02-22 DIAGNOSIS — E559 Vitamin D deficiency, unspecified: Secondary | ICD-10-CM

## 2018-02-22 DIAGNOSIS — I10 Essential (primary) hypertension: Secondary | ICD-10-CM | POA: Diagnosis not present

## 2018-02-22 DIAGNOSIS — E118 Type 2 diabetes mellitus with unspecified complications: Secondary | ICD-10-CM

## 2018-02-22 LAB — CBC WITH DIFFERENTIAL/PLATELET
BASOS ABS: 0.1 10*3/uL (ref 0.0–0.1)
BASOS PCT: 1.3 % (ref 0.0–3.0)
EOS ABS: 0.2 10*3/uL (ref 0.0–0.7)
Eosinophils Relative: 4 % (ref 0.0–5.0)
HEMATOCRIT: 41 % (ref 36.0–46.0)
Hemoglobin: 13.5 g/dL (ref 12.0–15.0)
LYMPHS PCT: 41.1 % (ref 12.0–46.0)
Lymphs Abs: 1.8 10*3/uL (ref 0.7–4.0)
MCHC: 33 g/dL (ref 30.0–36.0)
MCV: 88.4 fl (ref 78.0–100.0)
MONO ABS: 0.3 10*3/uL (ref 0.1–1.0)
Monocytes Relative: 6.8 % (ref 3.0–12.0)
NEUTROS ABS: 2 10*3/uL (ref 1.4–7.7)
Neutrophils Relative %: 46.8 % (ref 43.0–77.0)
PLATELETS: 365 10*3/uL (ref 150.0–400.0)
RBC: 4.64 Mil/uL (ref 3.87–5.11)
RDW: 13.6 % (ref 11.5–15.5)
WBC: 4.4 10*3/uL (ref 4.0–10.5)

## 2018-02-22 LAB — URINALYSIS, ROUTINE W REFLEX MICROSCOPIC
Bilirubin Urine: NEGATIVE
Hgb urine dipstick: NEGATIVE
Ketones, ur: NEGATIVE
Nitrite: NEGATIVE
PH: 5.5 (ref 5.0–8.0)
SPECIFIC GRAVITY, URINE: 1.025 (ref 1.000–1.030)
TOTAL PROTEIN, URINE-UPE24: NEGATIVE
URINE GLUCOSE: NEGATIVE
Urobilinogen, UA: 0.2 (ref 0.0–1.0)

## 2018-02-22 LAB — VITAMIN D 25 HYDROXY (VIT D DEFICIENCY, FRACTURES): VITD: 47.65 ng/mL (ref 30.00–100.00)

## 2018-02-22 LAB — COMPREHENSIVE METABOLIC PANEL
ALBUMIN: 4.1 g/dL (ref 3.5–5.2)
ALK PHOS: 90 U/L (ref 39–117)
ALT: 14 U/L (ref 0–35)
AST: 19 U/L (ref 0–37)
BUN: 15 mg/dL (ref 6–23)
CO2: 27 mEq/L (ref 19–32)
CREATININE: 0.95 mg/dL (ref 0.40–1.20)
Calcium: 10 mg/dL (ref 8.4–10.5)
Chloride: 105 mEq/L (ref 96–112)
GFR: 71.51 mL/min (ref >60.00)
GLUCOSE: 119 mg/dL — AB (ref 70–99)
POTASSIUM: 4.1 meq/L (ref 3.5–5.1)
SODIUM: 138 meq/L (ref 135–145)
TOTAL PROTEIN: 7.8 g/dL (ref 6.0–8.3)
Total Bilirubin: 0.4 mg/dL (ref 0.2–1.2)

## 2018-02-22 LAB — LIPID PANEL
CHOL/HDL RATIO: 3
CHOLESTEROL: 207 mg/dL — AB (ref 0–200)
HDL: 63.6 mg/dL (ref >39.00)
LDL CALC: 127 mg/dL — AB (ref 0–99)
NonHDL: 143.03
TRIGLYCERIDES: 82 mg/dL (ref 0.0–149.0)
VLDL: 16.4 mg/dL (ref 0.0–40.0)

## 2018-02-22 LAB — MICROALBUMIN / CREATININE URINE RATIO
CREATININE, U: 121.5 mg/dL
MICROALB/CREAT RATIO: 0.6 mg/g (ref 0.0–30.0)

## 2018-02-22 LAB — TSH: TSH: 2.4 u[IU]/mL (ref 0.35–4.50)

## 2018-02-22 LAB — HEMOGLOBIN A1C: Hgb A1c MFr Bld: 6.4 % (ref 4.6–6.5)

## 2018-02-22 LAB — SEDIMENTATION RATE: SED RATE: 78 mm/h — AB (ref 0–30)

## 2018-02-22 MED ORDER — DOXEPIN HCL 10 MG PO CAPS: 10.0000 mg | ORAL_CAPSULE | Freq: Every day | ORAL | 1 refills | Status: DC

## 2018-02-22 NOTE — Patient Instructions (Signed)
Pruritus  Pruritus is an itching feeling. There are many different conditions and factors that can make your skin itchy. Dry skin is one of the most common causes of itching. Most cases of itching do not require medical attention. Itchy skin can turn into a rash.  Follow these instructions at home:  Watch your pruritus for any changes. Take these steps to help with your condition:  Skin Care  · Moisturize your skin as needed. A moisturizer that contains petroleum jelly is best for keeping moisture in your skin.  · Take or apply medicines only as directed by your health care provider. This may include:  ? Corticosteroid cream.  ? Anti-itch lotions.  ? Oral anti-histamines.  · Apply cool compresses to the affected areas.  · Try taking a bath with:  ? Epsom salts. Follow the instructions on the packaging. You can get these at your local pharmacy or grocery store.  ? Baking soda. Pour a small amount into the bath as directed by your health care provider.  ? Colloidal oatmeal. Follow the instructions on the packaging. You can get this at your local pharmacy or grocery store.  · Try applying baking soda paste to your skin. Stir water into baking soda until it reaches a paste-like consistency.  · Do not scratch your skin.  · Avoid hot showers or baths, which can make itching worse. A cold shower may help with itching as long as you use a moisturizer after.  · Avoid scented soaps, detergents, and perfumes. Use gentle soaps, detergents, perfumes, and other cosmetic products.  General instructions  · Avoid wearing tight clothes.  · Keep a journal to help track what causes your itch. Write down:  ? What you eat.  ? What cosmetic products you use.  ? What you drink.  ? What you wear. This includes jewelry.  · Use a humidifier. This keeps the air moist, which helps to prevent dry skin.  Contact a health care provider if:  · The itching does not go away after several days.  · You sweat at night.  · You have weight loss.  · You  are unusually thirsty.  · You urinate more than normal.  · You are more tired than normal.  · You have abdominal pain.  · Your skin tingles.  · You feel weak.  · Your skin or the whites of your eyes look yellow (jaundice).  · Your skin feels numb.  This information is not intended to replace advice given to you by your health care provider. Make sure you discuss any questions you have with your health care provider.  Document Released: 02/25/2011 Document Revised: 11/21/2015 Document Reviewed: 06/11/2014  Elsevier Interactive Patient Education © 2018 [employer]

## 2018-02-23 NOTE — Progress Notes (Signed)
Subjective:  Patient ID: Brittany [name], female    DOB: 1929/05/04  Age: 84 y.o. MRN: [id number]  CC: Hypertension   HPI Brittany [name] presents for f/up - She tells me her blood pressure has been well controlled.  She complains of a one-month history of itching with no rash.  She has scratched some areas on her legs that have healed with hyperpigmentation but she does not see a rash.  She cannot identify if any of her antihypertensives are causing this.  She denies headache, blurred vision, chest pain, shortness of breath, edema, dizziness, lightheadedness, or near syncope.  Outpatient Medications Prior to Visit  Medication Sig Dispense Refill  . cloNIDine (CATAPRES) 0.1 MG tablet Take 1 tablet (0.1 mg total) by mouth every evening. 90 tablet 3  . nebivolol (BYSTOLIC) 10 MG tablet Take 1 tablet (10 mg total) by mouth daily. 90 tablet 3  . spironolactone (ALDACTONE) 50 MG tablet Take 1 tablet (50 mg total) by mouth daily. 90 tablet 3  . aspirin EC 81 MG EC tablet Take 81 mg by mouth every other day.      No facility-administered medications prior to visit.     ROS Review of Systems  Constitutional: Negative for diaphoresis and fatigue.  HENT: Negative.   Eyes: Negative for visual disturbance.  Respiratory: Negative for cough, chest tightness, shortness of breath and wheezing.   Cardiovascular: Negative for chest pain, palpitations and leg swelling.  Gastrointestinal: Negative for abdominal pain, constipation, diarrhea, nausea and vomiting.  Endocrine: Negative.   Genitourinary: Negative.  Negative for difficulty urinating and dysuria.  Musculoskeletal: Negative.  Negative for arthralgias and myalgias.  Skin: Negative.  Negative for color change and rash.  Neurological: Negative.  Negative for dizziness, weakness and light-headedness.  Hematological: Negative for adenopathy. Does not bruise/bleed easily.  Psychiatric/Behavioral: Negative.     Objective:  BP 140/90 (BP Location:  Left Arm, Patient Position: Sitting, Cuff Size: Normal)   Pulse 63   Temp 97.9 F (36.6 C) (Oral)   Resp 16   Ht 4\' 6"  (1.372 m)   Wt 179 lb 4 oz (81.3 kg)   SpO2 98%   BMI 43.22 kg/m   BP Readings from Last 3 Encounters:  02/22/18 140/90  02/10/18 132/82  01/27/18 140/80    Wt Readings from Last 3 Encounters:  02/22/18 179 lb 4 oz (81.3 kg)  01/27/18 182 lb (82.6 kg)  08/10/17 184 lb 1.9 oz (83.5 kg)    Physical Exam  Constitutional: No distress.  HENT:  Mouth/Throat: Oropharynx is clear and moist. No oropharyngeal exudate.  Eyes: Conjunctivae are normal. No scleral icterus.  Neck: Normal range of motion. Neck supple. No JVD present. No thyromegaly present.  Cardiovascular: Normal rate, regular rhythm and normal heart sounds. Exam reveals no gallop.  No murmur heard. Pulmonary/Chest: Effort normal and breath sounds normal. No respiratory distress. She has no wheezes. She has no rales.  Abdominal: Soft. Bowel sounds are normal. She exhibits no mass. There is no hepatosplenomegaly. There is no tenderness.  Lymphadenopathy:    She has no cervical adenopathy.    She has no axillary adenopathy.       Right: No inguinal and no supraclavicular adenopathy present.       Left: No supraclavicular adenopathy present.  Skin: Skin is warm and dry. She is not diaphoretic. No pallor.  Over bilateral lower extremities anteriorly there are a few hyperpigmented macules that measure about 5 to 6 mm.  These are consistent  with PIPA.  Vitals reviewed.   Lab Results  Component Value Date   WBC 4.4 02/22/2018   HGB 13.5 02/22/2018   HCT 41.0 02/22/2018   PLT 365.0 02/22/2018   GLUCOSE 119 (H) 02/22/2018   CHOL 207 (H) 02/22/2018   TRIG 82.0 02/22/2018   HDL 63.60 02/22/2018   LDLDIRECT 119.8 04/02/2008   LDLCALC 127 (H) 02/22/2018   ALT 14 02/22/2018   AST 19 02/22/2018   NA 138 02/22/2018   K 4.1 02/22/2018   CL 105 02/22/2018   CREATININE 0.95 02/22/2018   BUN 15 02/22/2018     CO2 27 02/22/2018   TSH 2.40 02/22/2018   HGBA1C 6.4 02/22/2018   MICROALBUR <0.7 02/22/2018    Mm 3d Screen Breast Bilateral  Result Date: 02/17/2018 CLINICAL DATA:  Screening. EXAM: DIGITAL SCREENING BILATERAL MAMMOGRAM WITH TOMO AND CAD COMPARISON:  Previous exam(s). ACR Breast Density Category a: The breast tissue is almost entirely fatty. FINDINGS: There are no findings suspicious for malignancy. Images were processed with CAD. IMPRESSION: No mammographic evidence of malignancy. A result letter of this screening mammogram will be mailed directly to the patient. RECOMMENDATION: Screening mammogram in one year. (Code:SM-B-01Y) BI-RADS CATEGORY  1: Negative. Electronically Signed   By: Dorise Bullion III M.D   On: 02/17/2018 10:59    Assessment & Plan:   Hedy was seen today for hypertension.  Diagnoses and all orders for this visit:  Itching- She has a one-month history of itching with no rash.  I have asked her to try a once daily antihistamine to control the itching.  Her CBC and CMP are normal.  However, her sed rate is very mildly elevated for her age.  This raises the concern that she may have an occult malignancy somewhere like lymphoma.  I have asked her to return in a few weeks for me to recheck her sed rate and to consider whether or not she needs to be evaluated for occult malignancy. -     CBC with Differential/Platelet; Future -     Sedimentation rate; Future -     Comprehensive metabolic panel; Future -     doxepin (SINEQUAN) 10 MG capsule; Take 1 capsule (10 mg total) by mouth at bedtime.  Essential hypertension, benign- Her blood pressure is well controlled.  Her electrolytes and renal function are normal. -     Urinalysis, Routine w reflex microscopic; Future -     Comprehensive metabolic panel; Future  Type 2 diabetes mellitus with complication, without long-term current use of insulin ([place])- Her A1c is at 6.4%.  Her blood sugars are adequately well controlled. -      Hemoglobin A1c; Future -     Microalbumin / creatinine urine ratio; Future  Hyperlipidemia with target LDL less than 100- Statin therapy is not indicated. -     Lipid panel; Future -     TSH; Future  Other iron deficiency anemia- Improvement noted -     CBC with Differential/Platelet; Future  Vitamin D deficiency- Her vitamin D level is normal now. -     VITAMIN D 25 Hydroxy (Vit-D Deficiency, Fractures); Future   I have discontinued Macky Lower. Ton's aspirin EC. I am also having her start on doxepin. Additionally, I am having her maintain her spironolactone, nebivolol, and cloNIDine.  Meds ordered this encounter  Medications  . doxepin (SINEQUAN) 10 MG capsule    Sig: Take 1 capsule (10 mg total) by mouth at bedtime.    Dispense:  90 capsule    Refill:  1     Follow-up: Return in about 3 months (around 05/25/2018).  Scarlette Calico, MD

## 2018-03-01 ENCOUNTER — Encounter

## 2018-03-01 ENCOUNTER — Ambulatory Visit: Payer: Medicare HMO | Admitting: Internal Medicine

## 2018-03-09 ENCOUNTER — Telehealth: Payer: Self-pay | Admitting: Internal Medicine

## 2018-03-09 NOTE — Telephone Encounter (Signed)
Copied from [place] [id number]. Topic: Quick Communication - Rx Refill/Question >> Mar 09, 2018  2:55 PM Burchel, Abbi R wrote: Medication: doxepin (SINEQUAN) 10 MG capsule  Delsa Sale requesting clinical information re: diagnosis.  Please call back: [phone] Ref #: [id number]

## 2018-03-10 NOTE — Telephone Encounter (Signed)
Called plan and gave additional clinical information regarding the findings.

## 2018-03-11 NOTE — Telephone Encounter (Signed)
PA for doxepin was denied. Is there an alternative pt can try?

## 2018-03-14 ENCOUNTER — Telehealth: Payer: Self-pay | Admitting: Internal Medicine

## 2018-03-14 NOTE — Telephone Encounter (Signed)
Copied from [place] [id number]. Topic: General - Other >> Mar 14, 2018  3:23 PM Yvette Rack wrote: Reason for CRM: pt calling back to speak with Dr Ronnald Ramp assistant about her medicine please call pt back at [phone] or [phone]

## 2018-03-14 NOTE — Telephone Encounter (Signed)
LVM for pt to call back as soon as possible.   

## 2018-03-15 NOTE — Telephone Encounter (Signed)
Previous note already started.

## 2018-03-15 NOTE — Telephone Encounter (Signed)
PA was denied for the doxepin. Pt has been informed of same.   Pt is requesting to change the bystolic as she feels this is the medication that his making her itch. Pt states that she does not itch when she does not take it. Please advise.

## 2018-03-28 ENCOUNTER — Telehealth: Payer: Self-pay | Admitting: Interventional Cardiology

## 2018-03-28 NOTE — Telephone Encounter (Signed)
Pt c/o medication issue:  1. Name of Medication: Bystolic  2. How are you currently taking this medication (dosage and times per day)? I time a day 50 mg  3. Are you having a reaction (difficulty breathing--STAT)? No  4. What is your medication issue? Perspiring a lot and losing weight

## 2018-03-28 NOTE — Telephone Encounter (Signed)
Pt states about 2 weeks ago she started having issues with perspiration and losing weight rapidly.  States she went from 184-160's.  Denies change in diet or any new medications.  States she usually barely breaks a sweat and over the last 2 weeks it has been like "water running down".  Advised pt this was not likely due to her cardiac medications and she should contact her PCP.  Pt verbalized understanding and will contact PCP in the morning.

## 2018-04-20 ENCOUNTER — Encounter: Payer: Self-pay | Admitting: Internal Medicine

## 2018-04-20 ENCOUNTER — Encounter

## 2018-04-20 ENCOUNTER — Ambulatory Visit (INDEPENDENT_AMBULATORY_CARE_PROVIDER_SITE_OTHER): Payer: Medicare HMO | Admitting: Internal Medicine

## 2018-04-20 VITALS — BP 140/94 | HR 79 | Temp 97.9°F | Resp 16 | Ht <= 58 in | Wt 178.0 lb

## 2018-04-20 DIAGNOSIS — Z23 Encounter for immunization: Secondary | ICD-10-CM | POA: Diagnosis not present

## 2018-04-20 DIAGNOSIS — R079 Chest pain, unspecified: Secondary | ICD-10-CM

## 2018-04-20 DIAGNOSIS — I10 Essential (primary) hypertension: Secondary | ICD-10-CM

## 2018-04-20 DIAGNOSIS — Z0000 Encounter for general adult medical examination without abnormal findings: Secondary | ICD-10-CM

## 2018-04-20 NOTE — Progress Notes (Signed)
Subjective:  Patient ID: Brittany [name], female    DOB: [birth year]  Age: 84 y.o. MRN: [id number]  CC: Hypertension and Annual Exam   HPI WAUNETTA RIGGLE presents for a CPX.  She has stopped taking Bystolic.  She claims that it made her feel weak, caused CP, sweat excessively, and caused insomnia.  She is compliant with the other 2 antihypertensives.  Past Medical History:  Diagnosis Date  . Cancer ([place])    right breast  . Diabetes mellitus    type 2. 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 Diverticulosis of colon   . DJD (degenerative joint disease) of knee    Left  . Hyperlipidemia   . Hypertension   . Osteoporosis   . Personal history of radiation therapy   . Positive PPD    history off   Past Surgical History:  Procedure Laterality Date  . BREAST LUMPECTOMY Right 2010  . BREAST SURGERY  2010   + RT    reports that she has never smoked. She has never used smokeless tobacco. She reports that she does not drink alcohol or use drugs. family history includes Cancer in her unknown relative; Coronary artery disease in her unknown relative; Diabetes in her father and unknown relative; Heart disease in her mother; Hypertension in her unknown relative; Lupus in her unknown relative; Stroke in her unknown relative. Allergies  Allergen Reactions  . Hctz [Hydrochlorothiazide] Other (See Comments)    Reactions: sweating, heart pain, itching  . Amlodipine Besylate     Muscle aches  . Chlorthalidone Other (See Comments)    Muscle aches  . Olmesartan     headache  . Alendronate Sodium     REACTION: heartburn  . Lisinopril     REACTION: dizziness    Outpatient Medications Prior to Visit  Medication Sig Dispense Refill  . cloNIDine (CATAPRES) 0.1 MG tablet Take 1 tablet (0.1 mg total) by mouth every evening. 90 tablet 3  . spironolactone (ALDACTONE) 50 MG tablet Take 1 tablet (50 mg total) by mouth daily. 90 tablet 3  . doxepin (SINEQUAN) 10 MG capsule Take 1  capsule (10 mg total) by mouth at bedtime. 90 capsule 1  . nebivolol (BYSTOLIC) 10 MG tablet Take 1 tablet (10 mg total) by mouth daily. (Patient taking differently: Take 10 mg by mouth every other day. ) 90 tablet 3   No facility-administered medications prior to visit.     ROS Review of Systems  Constitutional: Negative for diaphoresis, fatigue and unexpected weight change.  HENT: Negative.   Eyes: Negative for visual disturbance.  Respiratory: Negative for cough, chest tightness, shortness of breath and wheezing.   Cardiovascular: Positive for chest pain. Negative for palpitations and leg swelling.  Gastrointestinal: Negative for abdominal pain, constipation, diarrhea and nausea.  Endocrine: Negative.   Genitourinary: Negative.  Negative for difficulty urinating.  Musculoskeletal: Negative.  Negative for arthralgias and myalgias.  Skin: Negative.   Neurological: Negative for dizziness, weakness, light-headedness and numbness.  Hematological: Negative for adenopathy. Does not bruise/bleed easily.  Psychiatric/Behavioral: Negative.     Objective:  BP (!) 140/94 (BP Location: Left Arm, Patient Position: Sitting, Cuff Size: Normal)   Pulse 79   Temp 97.9 F (36.6 C) (Oral)   Resp 16   Ht 4\' 6"  (1.372 m)   Wt 178 lb (80.7 kg)   SpO2 99%   BMI 42.92 kg/m   BP Readings from Last 3 Encounters:  04/20/18 (!) 140/94  02/22/18  140/90  02/10/18 132/82    Wt Readings from Last 3 Encounters:  04/20/18 178 lb (80.7 kg)  02/22/18 179 lb 4 oz (81.3 kg)  01/27/18 182 lb (82.6 kg)    Physical Exam  Constitutional: She is oriented to person, place, and time. No distress.  HENT:  Mouth/Throat: Oropharynx is clear and moist. No oropharyngeal exudate.  Eyes: Conjunctivae are normal. No scleral icterus.  Neck: Normal range of motion. Neck supple. No JVD present. No thyromegaly present.  Cardiovascular: Normal rate, regular rhythm and normal heart sounds. Exam reveals no gallop.  No  murmur heard. EKG ---  Sinus  Rhythm  -Old anterior infarct.   ABNORMAL - no change from the prior EKG  Pulmonary/Chest: Effort normal and breath sounds normal. No respiratory distress. She has no wheezes. She has no rales.  Abdominal: Soft. Bowel sounds are normal. She exhibits no mass. There is no hepatosplenomegaly. There is no tenderness.  Musculoskeletal: Normal range of motion. She exhibits no edema, tenderness or deformity.  Lymphadenopathy:    She has no cervical adenopathy.  Neurological: She is alert and oriented to person, place, and time.  Skin: Skin is warm and dry. She is not diaphoretic. No pallor.  Vitals reviewed.   Lab Results  Component Value Date   WBC 4.4 02/22/2018   HGB 13.5 02/22/2018   HCT 41.0 02/22/2018   PLT 365.0 02/22/2018   GLUCOSE 119 (H) 02/22/2018   CHOL 207 (H) 02/22/2018   TRIG 82.0 02/22/2018   HDL 63.60 02/22/2018   LDLDIRECT 119.8 04/02/2008   LDLCALC 127 (H) 02/22/2018   ALT 14 02/22/2018   AST 19 02/22/2018   NA 138 02/22/2018   K 4.1 02/22/2018   CL 105 02/22/2018   CREATININE 0.95 02/22/2018   BUN 15 02/22/2018   CO2 27 02/22/2018   TSH 2.40 02/22/2018   HGBA1C 6.4 02/22/2018   MICROALBUR <0.7 02/22/2018    Mm 3d Screen Breast Bilateral  Result Date: 02/17/2018 CLINICAL DATA:  Screening. EXAM: DIGITAL SCREENING BILATERAL MAMMOGRAM WITH TOMO AND CAD COMPARISON:  Previous exam(s). ACR Breast Density Category a: The breast tissue is almost entirely fatty. FINDINGS: There are no findings suspicious for malignancy. Images were processed with CAD. IMPRESSION: No mammographic evidence of malignancy. A result letter of this screening mammogram will be mailed directly to the patient. RECOMMENDATION: Screening mammogram in one year. (Code:SM-B-01Y) BI-RADS CATEGORY  1: Negative. Electronically Signed   By: Dorise Bullion III M.D   On: 02/17/2018 10:59    Assessment & Plan:   Aimy was seen today for hypertension and annual  exam.  Diagnoses and all orders for this visit:  Chest pain, unspecified type- Her EKG is unchanged.  I do not think her recent chest pain was cardiac.  The pain has resolved since she stopped taking Bystolic.  She will let me know if she develops any new or worsening symptoms. -     EKG 12-Lead  Need for Tdap vaccination -     Tdap vaccine greater than or equal to 7yo IM  Routine general medical examination at a health care facility  Essential hypertension, benign- Her blood pressure is not adequately well controlled.  She is intolerant to multiple antihypertensives.  Will permit hypertension and continue clonidine and spironolactone at the current doses.  Her recent electrolytes and renal function were normal.   I have discontinued Macky Lower. Razzano's nebivolol and doxepin. I am also having her maintain her spironolactone and cloNIDine.  No orders  of the defined types were placed in this encounter.  See AVS for instructions about healthy living and anticipatory guidance.  Follow-up: Return in about 6 months (around 10/20/2018).  Scarlette Calico, MD

## 2018-04-20 NOTE — Patient Instructions (Signed)

## 2018-04-21 NOTE — Assessment & Plan Note (Signed)

## 2018-05-24 ENCOUNTER — Encounter: Payer: Self-pay | Admitting: Internal Medicine

## 2018-05-24 ENCOUNTER — Ambulatory Visit (INDEPENDENT_AMBULATORY_CARE_PROVIDER_SITE_OTHER): Payer: Medicare HMO | Admitting: Internal Medicine

## 2018-05-24 ENCOUNTER — Other Ambulatory Visit (INDEPENDENT_AMBULATORY_CARE_PROVIDER_SITE_OTHER): Payer: Medicare HMO

## 2018-05-24 VITALS — BP 138/86 | HR 65 | Temp 97.8°F | Resp 16 | Ht <= 58 in | Wt 177.2 lb

## 2018-05-24 DIAGNOSIS — D508 Other iron deficiency anemias: Secondary | ICD-10-CM

## 2018-05-24 DIAGNOSIS — I10 Essential (primary) hypertension: Secondary | ICD-10-CM | POA: Diagnosis not present

## 2018-05-24 DIAGNOSIS — Z6841 Body Mass Index (BMI) 40.0 and over, adult: Secondary | ICD-10-CM | POA: Diagnosis not present

## 2018-05-24 LAB — CBC WITH DIFFERENTIAL/PLATELET
BASOS PCT: 0.6 % (ref 0.0–3.0)
Basophils Absolute: 0 10*3/uL (ref 0.0–0.1)
EOS PCT: 5.1 % — AB (ref 0.0–5.0)
Eosinophils Absolute: 0.3 10*3/uL (ref 0.0–0.7)
HEMATOCRIT: 41.4 % (ref 36.0–46.0)
HEMOGLOBIN: 13.9 g/dL (ref 12.0–15.0)
LYMPHS PCT: 30.7 % (ref 12.0–46.0)
Lymphs Abs: 1.7 10*3/uL (ref 0.7–4.0)
MCHC: 33.5 g/dL (ref 30.0–36.0)
MCV: 88.1 fl (ref 78.0–100.0)
Monocytes Absolute: 0.4 10*3/uL (ref 0.1–1.0)
Monocytes Relative: 8.2 % (ref 3.0–12.0)
Neutro Abs: 3 10*3/uL (ref 1.4–7.7)
Neutrophils Relative %: 55.4 % (ref 43.0–77.0)
Platelets: 403 10*3/uL — ABNORMAL HIGH (ref 150.0–400.0)
RBC: 4.69 Mil/uL (ref 3.87–5.11)
RDW: 13.9 % (ref 11.5–15.5)
WBC: 5.4 10*3/uL (ref 4.0–10.5)

## 2018-05-24 LAB — BASIC METABOLIC PANEL
BUN: 18 mg/dL (ref 6–23)
CHLORIDE: 104 meq/L (ref 96–112)
CO2: 23 mEq/L (ref 19–32)
Calcium: 10 mg/dL (ref 8.4–10.5)
Creatinine, Ser: 1.05 mg/dL (ref 0.40–1.20)
GFR: 63.67 mL/min (ref >60.00)
Glucose, Bld: 134 mg/dL — ABNORMAL HIGH (ref 70–99)
POTASSIUM: 4.1 meq/L (ref 3.5–5.1)
SODIUM: 136 meq/L (ref 135–145)

## 2018-05-24 NOTE — Patient Instructions (Signed)

## 2018-05-24 NOTE — Progress Notes (Signed)
Subjective:  Patient ID: Brittany [name], female    DOB: 09/23/1928  Age: 84 y.o. MRN: [id number]  CC: Anemia and Hypertension   HPI Brittany [name] presents for f/up - She feels well and offers no complaints.  She tells me her blood pressure has been well controlled.  Outpatient Medications Prior to Visit  Medication Sig Dispense Refill  . cloNIDine (CATAPRES) 0.1 MG tablet Take 1 tablet (0.1 mg total) by mouth every evening. 90 tablet 3  . spironolactone (ALDACTONE) 50 MG tablet Take 1 tablet (50 mg total) by mouth daily. 90 tablet 3   No facility-administered medications prior to visit.     ROS Review of Systems  Constitutional: Negative for diaphoresis and fatigue.  HENT: Negative.   Eyes: Negative for visual disturbance.  Respiratory: Negative for cough, chest tightness, shortness of breath and wheezing.   Cardiovascular: Negative for chest pain, palpitations and leg swelling.  Gastrointestinal: Negative for abdominal pain, constipation, diarrhea, nausea and vomiting.  Genitourinary: Negative.  Negative for difficulty urinating.  Musculoskeletal: Negative.  Negative for arthralgias, joint swelling and myalgias.  Skin: Negative.  Negative for pallor.  Neurological: Negative.  Negative for dizziness, weakness and light-headedness.  Hematological: Negative for adenopathy. Does not bruise/bleed easily.  Psychiatric/Behavioral: Negative.     Objective:  BP 138/86 (BP Location: Left Arm, Patient Position: Sitting, Cuff Size: Normal)   Pulse 65   Temp 97.8 F (36.6 C) (Oral)   Resp 16   Ht 4\' 6"  (1.372 m)   Wt 177 lb 4 oz (80.4 kg)   SpO2 98%   BMI 42.74 kg/m   BP Readings from Last 3 Encounters:  05/24/18 138/86  04/20/18 (!) 140/94  02/22/18 140/90    Wt Readings from Last 3 Encounters:  05/24/18 177 lb 4 oz (80.4 kg)  04/20/18 178 lb (80.7 kg)  02/22/18 179 lb 4 oz (81.3 kg)    Physical Exam  Constitutional: She is oriented to person, place, and time. No  distress.  HENT:  Mouth/Throat: Oropharynx is clear and moist. No oropharyngeal exudate.  Eyes: Conjunctivae are normal. No scleral icterus.  Neck: Normal range of motion. Neck supple. No JVD present. No thyromegaly present.  Cardiovascular: Normal rate, regular rhythm and normal heart sounds.  No murmur heard. Pulmonary/Chest: Effort normal and breath sounds normal. No respiratory distress. She has no wheezes. She has no rales.  Abdominal: Soft. Bowel sounds are normal. She exhibits no mass. There is no hepatosplenomegaly. There is no tenderness.  Musculoskeletal: Normal range of motion. She exhibits no edema, tenderness or deformity.  Lymphadenopathy:    She has no cervical adenopathy.  Neurological: She is alert and oriented to person, place, and time.  Skin: Skin is warm and dry. She is not diaphoretic.  Vitals reviewed.   Lab Results  Component Value Date   WBC 5.4 05/24/2018   HGB 13.9 05/24/2018   HCT 41.4 05/24/2018   PLT 403.0 (H) 05/24/2018   GLUCOSE 134 (H) 05/24/2018   CHOL 207 (H) 02/22/2018   TRIG 82.0 02/22/2018   HDL 63.60 02/22/2018   LDLDIRECT 119.8 04/02/2008   LDLCALC 127 (H) 02/22/2018   ALT 14 02/22/2018   AST 19 02/22/2018   NA 136 05/24/2018   K 4.1 05/24/2018   CL 104 05/24/2018   CREATININE 1.05 05/24/2018   BUN 18 05/24/2018   CO2 23 05/24/2018   TSH 2.40 02/22/2018   HGBA1C 6.4 02/22/2018   MICROALBUR <0.7 02/22/2018    Mm 3d  Screen Breast Bilateral  Result Date: 02/17/2018 CLINICAL DATA:  Screening. EXAM: DIGITAL SCREENING BILATERAL MAMMOGRAM WITH TOMO AND CAD COMPARISON:  Previous exam(s). ACR Breast Density Category a: The breast tissue is almost entirely fatty. FINDINGS: There are no findings suspicious for malignancy. Images were processed with CAD. IMPRESSION: No mammographic evidence of malignancy. A result letter of this screening mammogram will be mailed directly to the patient. RECOMMENDATION: Screening mammogram in one year.  (Code:SM-B-01Y) BI-RADS CATEGORY  1: Negative. Electronically Signed   By: Dorise Bullion III M.D   On: 02/17/2018 10:59    Assessment & Plan:   Man was seen today for anemia and hypertension.  Diagnoses and all orders for this visit:  Essential hypertension, benign- Her blood pressure is well controlled.  Electrolytes and renal function are normal. -     Basic metabolic panel; Future  Iron deficiency anemia secondary to inadequate dietary iron intake- Her H&H are normal now. -     CBC with Differential/Platelet; Future   I am having Brittany [name] maintain her spironolactone and cloNIDine.  No orders of the defined types were placed in this encounter.    Follow-up: Return in about 6 months (around 11/22/2018).  Scarlette Calico, MD

## 2018-08-05 ENCOUNTER — Telehealth: Payer: Self-pay | Admitting: Interventional Cardiology

## 2018-08-05 NOTE — Telephone Encounter (Signed)
Called and left message for pt to call back.    Pt scheduled to see Dr. Tamala Julian 2/13 at [facility].  Dr. Tamala Julian has a meeting from 8-9A.  Clinic will start at 9A.  Can move pt to an open slot that I have blocked on that day.

## 2018-08-05 NOTE — Telephone Encounter (Signed)
Spoke with pt and rescheduled her for 2/27

## 2018-08-11 ENCOUNTER — Ambulatory Visit: Payer: Medicare HMO | Admitting: Interventional Cardiology

## 2018-08-25 ENCOUNTER — Ambulatory Visit: Payer: Medicare HMO | Admitting: Interventional Cardiology

## 2018-08-25 ENCOUNTER — Encounter: Payer: Self-pay | Admitting: Interventional Cardiology

## 2018-08-25 VITALS — BP 130/80 | HR 102 | Ht <= 58 in | Wt 170.0 lb

## 2018-08-25 DIAGNOSIS — E118 Type 2 diabetes mellitus with unspecified complications: Secondary | ICD-10-CM | POA: Diagnosis not present

## 2018-08-25 DIAGNOSIS — I10 Essential (primary) hypertension: Secondary | ICD-10-CM | POA: Diagnosis not present

## 2018-08-25 DIAGNOSIS — R011 Cardiac murmur, unspecified: Secondary | ICD-10-CM

## 2018-08-25 DIAGNOSIS — E785 Hyperlipidemia, unspecified: Secondary | ICD-10-CM | POA: Diagnosis not present

## 2018-08-25 NOTE — Patient Instructions (Addendum)
Medication Instructions:  Your physician recommends that you continue on your current medications as directed. Please refer to the Current Medication list given to you today. If you need a refill on your cardiac medications before your next appointment, please call your pharmacy.   Lab work: Your physician recommends that you return for lab work same day as echo Artist)  If you have labs (blood work) drawn today and your tests are completely normal, you will receive your results only by: Marland Kitchen MyChart Message (if you have MyChart) OR . A paper copy in the mail If you have any lab test that is abnormal or we need to change your treatment, we will call you to review the results.  Testing/Procedures: Your physician has requested that you have an echocardiogram. Echocardiography is a painless test that uses sound waves to create images of your heart. It provides your doctor with information about the size and shape of your heart and how well your heart's chambers and valves are working. This procedure takes approximately one hour. There are no restrictions for this procedure.  Follow-Up: At [facility], you and your health needs are our priority.  As part of our continuing mission to provide you with exceptional heart care, we have created designated Provider Care Teams.  These Care Teams include your primary Cardiologist (physician) and Advanced Practice Providers (APPs -  Physician Assistants and Nurse Practitioners) who all work together to provide you with the care you need, when you need it. You will need a follow up appointment in 4 months.  Please call our office 2 months in advance to schedule this appointment.  You may see Sinclair Grooms, MD or one of the following Advanced Practice Providers on your designated Care Team:   Truitt Merle, NP Cecilie Kicks, NP . Kathyrn Drown, NP  Any Other Special Instructions Will Be Listed Below (If Applicable).  Continue to monitor your blood pressure  at home at least twice weekly.  Limit your fluid intake to 1.5 liters daily.  Low-Sodium Eating Plan Sodium, which is an element that makes up salt, helps you maintain a healthy balance of fluids in your body. Too much sodium can increase your blood pressure and cause fluid and waste to be held in your body. Your health care provider or dietitian may recommend following this plan if you have high blood pressure (hypertension), kidney disease, liver disease, or heart failure. Eating less sodium can help lower your blood pressure, reduce swelling, and protect your heart, liver, and kidneys. What are tips for following this plan? General guidelines  Most people on this plan should limit their sodium intake to 1,500-2,000 mg (milligrams) of sodium each day. Reading food labels   The Nutrition Facts label lists the amount of sodium in one serving of the food. If you eat more than one serving, you must multiply the listed amount of sodium by the number of servings.  Choose foods with less than 140 mg of sodium per serving.  Avoid foods with 300 mg of sodium or more per serving. Shopping  Look for lower-sodium products, often labeled as "low-sodium" or "no salt added."  Always check the sodium content even if foods are labeled as "unsalted" or "no salt added".  Buy fresh foods. ? Avoid canned foods and premade or frozen meals. ? Avoid canned, cured, or processed meats  Buy breads that have less than 80 mg of sodium per slice. Cooking  Eat more home-cooked food and less restaurant, buffet, and  fast food.  Avoid adding salt when cooking. Use salt-free seasonings or herbs instead of table salt or sea salt. Check with your health care provider or pharmacist before using salt substitutes.  Cook with plant-based oils, such as canola, sunflower, or olive oil. Meal planning  When eating at a restaurant, ask that your food be prepared with less salt or no salt, if possible.  Avoid foods that  contain MSG (monosodium glutamate). MSG is sometimes added to [place] food, bouillon, and some canned foods. What foods are recommended? The items listed may not be a complete list. Talk with your dietitian about what dietary choices are best for you. Grains Low-sodium cereals, including oats, puffed wheat and rice, and shredded wheat. Low-sodium crackers. Unsalted rice. Unsalted pasta. Low-sodium bread. Whole-grain breads and whole-grain pasta. Vegetables Fresh or frozen vegetables. "No salt added" canned vegetables. "No salt added" tomato sauce and paste. Low-sodium or reduced-sodium tomato and vegetable juice. Fruits Fresh, frozen, or canned fruit. Fruit juice. Meats and other protein foods Fresh or frozen (no salt added) meat, poultry, seafood, and fish. Low-sodium canned tuna and salmon. Unsalted nuts. Dried peas, beans, and lentils without added salt. Unsalted canned beans. Eggs. Unsalted nut butters. Dairy Milk. Soy milk. Cheese that is naturally low in sodium, such as ricotta cheese, fresh mozzarella, or Swiss cheese Low-sodium or reduced-sodium cheese. Cream cheese. Yogurt. Fats and oils Unsalted butter. Unsalted margarine with no trans fat. Vegetable oils such as canola or olive oils. Seasonings and other foods Fresh and dried herbs and spices. Salt-free seasonings. Low-sodium mustard and ketchup. Sodium-free salad dressing. Sodium-free light mayonnaise. Fresh or refrigerated horseradish. Lemon juice. Vinegar. Homemade, reduced-sodium, or low-sodium soups. Unsalted popcorn and pretzels. Low-salt or salt-free chips. What foods are not recommended? The items listed may not be a complete list. Talk with your dietitian about what dietary choices are best for you. Grains Instant hot cereals. Bread stuffing, pancake, and biscuit mixes. Croutons. Seasoned rice or pasta mixes. Noodle soup cups. Boxed or frozen macaroni and cheese. Regular salted crackers. Self-rising  flour. Vegetables Sauerkraut, pickled vegetables, and relishes. Olives. [place] fries. Onion rings. Regular canned vegetables (not low-sodium or reduced-sodium). Regular canned tomato sauce and paste (not low-sodium or reduced-sodium). Regular tomato and vegetable juice (not low-sodium or reduced-sodium). Frozen vegetables in sauces. Meats and other protein foods Meat or fish that is salted, canned, smoked, spiced, or pickled. Bacon, ham, sausage, hotdogs, corned beef, chipped beef, packaged lunch meats, salt pork, jerky, pickled herring, anchovies, regular canned tuna, sardines, salted nuts. Dairy Processed cheese and cheese spreads. Cheese curds. Blue cheese. Feta cheese. String cheese. Regular cottage cheese. Buttermilk. Canned milk. Fats and oils Salted butter. Regular margarine. Ghee. Bacon fat. Seasonings and other foods Onion salt, garlic salt, seasoned salt, table salt, and sea salt. Canned and packaged gravies. Worcestershire sauce. Tartar sauce. Barbecue sauce. Teriyaki sauce. Soy sauce, including reduced-sodium. Steak sauce. Fish sauce. Oyster sauce. Cocktail sauce. Horseradish that you find on the shelf. Regular ketchup and mustard. Meat flavorings and tenderizers. Bouillon cubes. Hot sauce and Tabasco sauce. Premade or packaged marinades. Premade or packaged taco seasonings. Relishes. Regular salad dressings. Salsa. Potato and tortilla chips. Corn chips and puffs. Salted popcorn and pretzels. Canned or dried soups. Pizza. Frozen entrees and pot pies. Summary  Eating less sodium can help lower your blood pressure, reduce swelling, and protect your heart, liver, and kidneys.  Most people on this plan should limit their sodium intake to 1,500-2,000 mg (milligrams) of sodium each day.  Canned,  boxed, and frozen foods are high in sodium. Restaurant foods, fast foods, and pizza are also very high in sodium. You also get sodium by adding salt to food.  Try to cook at home, eat more fresh  fruits and vegetables, and eat less fast food, canned, processed, or prepared foods. This information is not intended to replace advice given to you by your health care provider. Make sure you discuss any questions you have with your health care provider. Document Released: 12/05/2001 Document Revised: 06/08/2016 Document Reviewed: 06/08/2016 Elsevier Interactive Patient Education  2019 [employer].

## 2018-08-25 NOTE — Progress Notes (Addendum)
Cardiology Office Note:    Date:  08/25/2018   ID:  Dannetta, Lekas 1928/11/27, MRN [id number]  PCP:  Janith Lima, MD  Cardiologist:  Sinclair Grooms, MD   Referring MD: Janith Lima, MD   Chief Complaint  Patient presents with  . Hypertension    History of Present Illness:    Brittany [name] is a 85 y.o. female with a hx of difficult to control hypertension.  Also has a history of hyperlipidemia, type 2 diabetes, and medication intolerance.  She is disappointed that she is not seeing me more.  She has been in the hypertension clinic and multiple treatment strategies have been tried without success.  She does not limit sodium in her diet.  She has no lower extremity swelling.  She takes in significant amounts of fluid each day.  She does not use nonsteroidal therapy or alcohol.  Not as active as she would like to be because she has no energy.  States that she sleeps well.  Past Medical History:  Diagnosis Date  . Cancer ([place])    right breast  . Diabetes mellitus    type 2. 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 Diverticulosis of colon   . DJD (degenerative joint disease) of knee    Left  . Hyperlipidemia   . Hypertension   . Osteoporosis   . Personal history of radiation therapy   . Positive PPD    history off    Past Surgical History:  Procedure Laterality Date  . BREAST LUMPECTOMY Right 2010  . BREAST SURGERY  2010   + RT    Current Medications: Current Meds  Medication Sig  . cloNIDine (CATAPRES) 0.1 MG tablet Take 1 tablet (0.1 mg total) by mouth every evening.  Marland Kitchen spironolactone (ALDACTONE) 50 MG tablet Take 1 tablet (50 mg total) by mouth daily.     Allergies:   Hctz [hydrochlorothiazide]; Amlodipine besylate; Chlorthalidone; Olmesartan; Alendronate sodium; Lisinopril; and Bystolic [nebivolol hcl]   Social History   Socioeconomic History  . Marital status: Widowed    Spouse name: Not on file  . Number of children: 4  . Years  of education: Not on file  . Highest education level: Not on file  Occupational History  . Occupation: CNA    Comment: [employer]- home care [place]  . Financial resource strain: Not on file  . Food insecurity:    Worry: Not on file    Inability: Not on file  . Transportation needs:    Medical: Not on file    Non-medical: Not on file  Tobacco Use  . Smoking status: Never Smoker  . Smokeless tobacco: Never Used  Substance and Sexual Activity  . Alcohol use: No  . Drug use: No  . Sexual activity: Never  Lifestyle  . Physical activity:    Days per week: Not on file    Minutes per session: Not on file  . Stress: Not on file  Relationships  . Social connections:    Talks on phone: Not on file    Gets together: Not on file    Attends religious service: Not on file    Active member of club or organization: Not on file    Attends meetings of clubs or organizations: Not on file    Relationship status: Not on file  Other Topics Concern  . 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  ROS:   Please see the history of present illness.    Weight loss, rash, self diagnosed allergy to  Bysystolic.  Multiple medication intolerances.  All other systems reviewed and are negative.  EKGs/Labs/Other Studies Reviewed:    The following studies were reviewed today: No new data  EKG:  EKG not repeated  Recent Labs: 02/22/2018: ALT 14; TSH 2.40 05/24/2018: BUN 18; Creatinine, Ser 1.05; Hemoglobin 13.9; Platelets 403.0; Potassium 4.1; Sodium 136  Recent Lipid Panel    Component Value Date/Time   CHOL 207 (H)  02/22/2018 1113   TRIG 82.0 02/22/2018 1113   TRIG 70 07/12/2006 0949   HDL 63.60 02/22/2018 1113   CHOLHDL 3 02/22/2018 1113   VLDL 16.4 02/22/2018 1113   LDLCALC 127 (H) 02/22/2018 1113   LDLDIRECT 119.8 04/02/2008 0000    Physical Exam:    VS:  BP 130/80   Pulse (!) 102   Ht 4\' 6"  (1.372 m)   Wt 170 lb (77.1 kg)   SpO2 97%   BMI 40.99 kg/m     Wt Readings from Last 3 Encounters:  08/25/18 170 lb (77.1 kg)  05/24/18 177 lb 4 oz (80.4 kg)  04/20/18 178 lb (80.7 kg)     GEN: Obese. No acute distress HEENT: Normal NECK: No JVD. LYMPHATICS: No lymphadenopathy CARDIAC: 1/6 systolic murmur RRR.  No diastolic murmur, S4 gallop, no edema VASCULAR: 2+ bilateral radial pulses, no bruits RESPIRATORY:  Clear to auscultation without rales, wheezing or rhonchi  ABDOMEN: Soft, non-tender, non-distended, No pulsatile mass, MUSCULOSKELETAL: No deformity  SKIN: Warm and dry NEUROLOGIC:  Alert and oriented x 3 PSYCHIATRIC:  Normal affect   ASSESSMENT:    1. Essential hypertension, benign   2. Systolic murmur   3. Type 2 diabetes mellitus with complication, without long-term current use of insulin ([place])   4. Hyperlipidemia with target LDL less than 100    PLAN:    In order of problems listed above:  1. Borderline blood pressure control.  Next move will be to increase clonidine to 0.1 mg twice daily or switch to a patch.  Continue Aldactone.  BMet when she returns for echocardiogram. 2. Echocardiogram to assess systolic murmur and rule out diastolic/systolic heart failure. 3. Not addressed 4. LDL target less than 70.  Have recommended 2 g sodium diet and less than 1500 cc of fluid per day.  Avoid nonsteroidal anti-inflammatory agents.  We will check an echocardiogram to assess LV and aortic valve.  Rule out IHSS.   Medication Adjustments/Labs and Tests Ordered: Current medicines are reviewed at length with the patient today.  Concerns regarding medicines are outlined above.    No orders of the defined types were placed in this encounter.  No orders of the defined types were placed in this encounter.   Patient Instructions  Medication Instructions:  Your physician recommends that you continue on your current medications as directed. Please refer to the Current Medication list given to you today. If you need a refill on your cardiac medications before your next appointment, please call your pharmacy.   Lab work: None If you have labs (blood work) drawn today and your  tests are completely normal, you will receive your results only by: Marland Kitchen MyChart Message (if you have MyChart) OR . A paper copy in the mail If you have any lab test that is abnormal or we need to change your treatment, we will call you to review the results.  Testing/Procedures: Your physician has requested that you have an echocardiogram. Echocardiography is a painless test that uses sound waves to create images of your heart. It provides your doctor with information about the size and shape of your heart and how well your heart's chambers and valves are working. This procedure takes approximately one hour. There are no restrictions for this procedure.  Follow-Up: At [facility], you and your health needs are our priority.  As part of our continuing mission to provide you with exceptional heart care, we have created designated Provider Care Teams.  These Care Teams include your primary Cardiologist (physician) and Advanced Practice Providers (APPs -  Physician Assistants and Nurse Practitioners) who all work together to provide you with the care you need, when you need it. You will need a follow up appointment in 12 months.  Please call our office 2 months in advance to schedule this appointment.  You may see Sinclair Grooms, MD or one of the following Advanced Practice Providers on your designated Care Team:   Truitt Merle, NP Cecilie Kicks, NP . Kathyrn Drown, NP  Any Other Special  Instructions Will Be Listed Below (If Applicable).  Continue to monitor your blood pressure at home at least twice weekly.  Limit your fluid intake to 1.5 liters daily.  Low-Sodium Eating Plan Sodium, which is an element that makes up salt, helps you maintain a healthy balance of fluids in your body. Too much sodium can increase your blood pressure and cause fluid and waste to be held in your body. Your health care provider or dietitian may recommend following this plan if you have high blood pressure (hypertension), kidney disease, liver disease, or heart failure. Eating less sodium can help lower your blood pressure, reduce swelling, and protect your heart, liver, and kidneys. What are tips for following this plan? General guidelines  Most people on this plan should limit their sodium intake to 1,500-2,000 mg (milligrams) of sodium each day. Reading food labels   The Nutrition Facts label lists the amount of sodium in one serving of the food. If you eat more than one serving, you must multiply the listed amount of sodium by the number of servings.  Choose foods with less than 140 mg of sodium per serving.  Avoid foods with 300 mg of sodium or more per serving. Shopping  Look for lower-sodium products, often labeled as "low-sodium" or "no salt added."  Always check the sodium content even if foods are labeled as "unsalted" or "no salt added".  Buy fresh foods. ? Avoid canned foods and premade or frozen meals. ? Avoid canned, cured, or processed meats  Buy breads that have less than 80 mg of sodium per slice. Cooking  Eat more home-cooked food and less restaurant, buffet, and fast food.  Avoid adding salt when cooking. Use salt-free seasonings or herbs instead of table salt or sea salt. Check with your health care provider or pharmacist before using salt substitutes.  Cook with plant-based oils, such as canola, sunflower, or olive oil. Meal planning  When eating at a  restaurant, ask that your food be prepared with less salt or no salt, if possible.  Avoid foods that contain  MSG (monosodium glutamate). MSG is sometimes added to [place] food, bouillon, and some canned foods. What foods are recommended? The items listed may not be a complete list. Talk with your dietitian about what dietary choices are best for you. Grains Low-sodium cereals, including oats, puffed wheat and rice, and shredded wheat. Low-sodium crackers. Unsalted rice. Unsalted pasta. Low-sodium bread. Whole-grain breads and whole-grain pasta. Vegetables Fresh or frozen vegetables. "No salt added" canned vegetables. "No salt added" tomato sauce and paste. Low-sodium or reduced-sodium tomato and vegetable juice. Fruits Fresh, frozen, or canned fruit. Fruit juice. Meats and other protein foods Fresh or frozen (no salt added) meat, poultry, seafood, and fish. Low-sodium canned tuna and salmon. Unsalted nuts. Dried peas, beans, and lentils without added salt. Unsalted canned beans. Eggs. Unsalted nut butters. Dairy Milk. Soy milk. Cheese that is naturally low in sodium, such as ricotta cheese, fresh mozzarella, or Swiss cheese Low-sodium or reduced-sodium cheese. Cream cheese. Yogurt. Fats and oils Unsalted butter. Unsalted margarine with no trans fat. Vegetable oils such as canola or olive oils. Seasonings and other foods Fresh and dried herbs and spices. Salt-free seasonings. Low-sodium mustard and ketchup. Sodium-free salad dressing. Sodium-free light mayonnaise. Fresh or refrigerated horseradish. Lemon juice. Vinegar. Homemade, reduced-sodium, or low-sodium soups. Unsalted popcorn and pretzels. Low-salt or salt-free chips. What foods are not recommended? The items listed may not be a complete list. Talk with your dietitian about what dietary choices are best for you. Grains Instant hot cereals. Bread stuffing, pancake, and biscuit mixes. Croutons. Seasoned rice or pasta mixes. Noodle soup  cups. Boxed or frozen macaroni and cheese. Regular salted crackers. Self-rising flour. Vegetables Sauerkraut, pickled vegetables, and relishes. Olives. [place] fries. Onion rings. Regular canned vegetables (not low-sodium or reduced-sodium). Regular canned tomato sauce and paste (not low-sodium or reduced-sodium). Regular tomato and vegetable juice (not low-sodium or reduced-sodium). Frozen vegetables in sauces. Meats and other protein foods Meat or fish that is salted, canned, smoked, spiced, or pickled. Bacon, ham, sausage, hotdogs, corned beef, chipped beef, packaged lunch meats, salt pork, jerky, pickled herring, anchovies, regular canned tuna, sardines, salted nuts. Dairy Processed cheese and cheese spreads. Cheese curds. Blue cheese. Feta cheese. String cheese. Regular cottage cheese. Buttermilk. Canned milk. Fats and oils Salted butter. Regular margarine. Ghee. Bacon fat. Seasonings and other foods Onion salt, garlic salt, seasoned salt, table salt, and sea salt. Canned and packaged gravies. Worcestershire sauce. Tartar sauce. Barbecue sauce. Teriyaki sauce. Soy sauce, including reduced-sodium. Steak sauce. Fish sauce. Oyster sauce. Cocktail sauce. Horseradish that you find on the shelf. Regular ketchup and mustard. Meat flavorings and tenderizers. Bouillon cubes. Hot sauce and Tabasco sauce. Premade or packaged marinades. Premade or packaged taco seasonings. Relishes. Regular salad dressings. Salsa. Potato and tortilla chips. Corn chips and puffs. Salted popcorn and pretzels. Canned or dried soups. Pizza. Frozen entrees and pot pies. Summary  Eating less sodium can help lower your blood pressure, reduce swelling, and protect your heart, liver, and kidneys.  Most people on this plan should limit their sodium intake to 1,500-2,000 mg (milligrams) of sodium each day.  Canned, boxed, and frozen foods are high in sodium. Restaurant foods, fast foods, and pizza are also very high in sodium. You  also get sodium by adding salt to food.  Try to cook at home, eat more fresh fruits and vegetables, and eat less fast food, canned, processed, or prepared foods. This information is not intended to replace advice given to you by your health care provider. Make sure you discuss  any questions you have with your health care provider. Document Released: 12/05/2001 Document Revised: 06/08/2016 Document Reviewed: 06/08/2016 Elsevier Interactive Patient Education  2019 [place], Sinclair Grooms, MD  08/25/2018 11:36 AM    [place]

## 2018-09-06 ENCOUNTER — Other Ambulatory Visit: Payer: Medicare HMO

## 2018-09-06 ENCOUNTER — Ambulatory Visit (HOSPITAL_COMMUNITY): Payer: Medicare HMO | Attending: Cardiovascular Disease

## 2018-09-06 DIAGNOSIS — I10 Essential (primary) hypertension: Secondary | ICD-10-CM | POA: Insufficient documentation

## 2018-09-06 DIAGNOSIS — R011 Cardiac murmur, unspecified: Secondary | ICD-10-CM

## 2018-09-06 LAB — BASIC METABOLIC PANEL
BUN/Creatinine Ratio: 11 — ABNORMAL LOW (ref 12–28)
BUN: 11 mg/dL (ref 8–27)
CO2: 18 mmol/L — AB (ref 20–29)
Calcium: 10 mg/dL (ref 8.7–10.3)
Chloride: 105 mmol/L (ref 96–106)
Creatinine, Ser: 1.04 mg/dL — ABNORMAL HIGH (ref 0.57–1.00)
GFR calc Af Amer: 56 mL/min/{1.73_m2} — ABNORMAL LOW (ref >59)
GFR, EST NON AFRICAN AMERICAN: 48 mL/min/{1.73_m2} — AB (ref >59)
Glucose: 100 mg/dL — ABNORMAL HIGH (ref 65–99)
POTASSIUM: 4.7 mmol/L (ref 3.5–5.2)
SODIUM: 139 mmol/L (ref 134–144)

## 2018-09-16 ENCOUNTER — Other Ambulatory Visit: Payer: Self-pay | Admitting: Interventional Cardiology

## 2018-09-16 DIAGNOSIS — I10 Essential (primary) hypertension: Secondary | ICD-10-CM

## 2018-10-07 ENCOUNTER — Other Ambulatory Visit: Payer: Medicare HMO

## 2018-12-23 ENCOUNTER — Telehealth: Payer: Self-pay | Admitting: Pharmacist

## 2018-12-23 NOTE — Telephone Encounter (Signed)
Patient called stating she gets itching an hour or so after taking her medication. Patient states it is the spironolactone. I have advised her to stop the spironolactone and start taking her clonidine 0.1mg  twice a day. I have made her an appointment in the HTN clinic for Monday to reevaluate. Patient appreciative  1. COVID-19 Pre-Screening Questions:  . In the past 7 to 10 days have you had a cough,  shortness of breath, headache, congestion, fever (100 or greater) body aches, chills, sore throat, or sudden loss of taste or sense of smell?  no . Have you been around anyone with known Covid 19.  no . Have you been around anyone who is awaiting Covid 19 test results in the past 7 to 10 days?  no . Have you been around anyone who has been exposed to Covid 19, or has mentioned symptoms of Covid 19 within the past 7 to 10 days?  no    2. Pt advised of visitor restrictions (no visitors allowed except if needed to conduct the visit). Also advised to arrive at appointment time and wear a mask.    Marland Kitchen

## 2018-12-26 ENCOUNTER — Ambulatory Visit (INDEPENDENT_AMBULATORY_CARE_PROVIDER_SITE_OTHER): Payer: Medicare HMO | Admitting: Pharmacist

## 2018-12-26 ENCOUNTER — Other Ambulatory Visit: Payer: Self-pay

## 2018-12-26 VITALS — BP 130/90 | HR 79

## 2018-12-26 DIAGNOSIS — I10 Essential (primary) hypertension: Secondary | ICD-10-CM | POA: Diagnosis not present

## 2018-12-26 MED ORDER — VALSARTAN 40 MG PO TABS: 40.0000 mg | ORAL_TABLET | Freq: Every day | ORAL | 11 refills | Status: DC

## 2018-12-26 NOTE — Patient Instructions (Addendum)
STOP taking spironolactone 50mg   CONTINUE taking clonidine 0.1mg  daily at bedtime START taking valsartan 40mg  daily Check your blood pressure once a day and write down your readings. Bring your readings AND you blood pressure monitor with you to your next appointment. Call us at [phone] with any questions or concerns

## 2018-12-26 NOTE — Progress Notes (Signed)
Patient ID: Brittany [name]                 DOB: 1929/05/08                      MRN: [id number]     HPI: Brittany [name] is a 85 y.o. female patient of Dr. Tamala Julian who presents today for hypertension follow up. PMH significant for R breast cancer, allergic rhinitis, T2DM, OA L knee, HTN, and HLD. Pt has a history of multiple medication intolerances noted below and was referred to Dr. Tamala Julian for further management. At last visit with HTN clinic, patients blood pressure was controlled and she was to follow up prn. Patient called clinic on Friday afternoon stating after she takes her spironolactone she gets a red rash on her neck and itching. States has been happening for a day or two. Patient was instructed to stop spironolactone and continue clonidine 0.1mg  but to take twice a day now. She was scheduled to be see in HTN clinic Monday morning.  She presents today for follow up. She has stopped taking spironolactone and states itching is much better. She is only taking clonidine 0.1mg  at bedtime. She states her blood pressure has been high. Something order 104 yesterday she says. Patient denies new soaps, detergents, medications, manufactures or food. She did not bring her meter with her.  Patient a litter hesitant at first about seeing pharmacist and not Dr. Tamala Julian. After some explanation of our clinic (which patient has been seen in before) she felt more comfortable.   During the interview some of the patients information was contradictory. She states that her blood pressure has been high, around what I got in clinic. However the readings in clinic were actually well controlled.   Current HTN meds: clonidine 0.1mg  at bedtime  Previously tried:amlodipine (muscle soreness), chlorthalidone (muscle aches), HCTZ (sweating, itching), lisinopril (dizziness), olmesartan (HA), diltiazem (stopped by MDto change to amlodipine), clonidine 0.1mg  TID (pt self-discontinued), spironolactone 50mg  daily (rash, itching)   BP goal:<140/90 mmHg- targeting more conservative goal given age  Family History:The patient's family history includes Cancer in her unknown relative; Coronary artery disease in her unknown relative; Diabetes in her father and unknown relative; Heart disease in her mother; Hypertension in her unknown relative; Lupus in her unknown relative; Stroke in her unknown relative.  Social History:never smoker, denies alcohol or illicit drug use  Diet:morning meal (~11:00) - cereal and banana, cheese sandwich; evening meal (~5:00) - salads with egg, cheese, spinach, broccoli; sometimes adds salt to food; avoids fried foods; eats cheetos occasionally; mostly cooks own meals. One cup of coffee/day.  Exercise:walks and does leg workouts every other day for 30 minutes  Home BP readings:   Wt Readings from Last 3 Encounters:  08/25/18 170 lb (77.1 kg)  05/24/18 177 lb 4 oz (80.4 kg)  04/20/18 178 lb (80.7 kg)   BP Readings from Last 3 Encounters:  08/25/18 130/80  05/24/18 138/86  04/20/18 (!) 140/94   Pulse Readings from Last 3 Encounters:  08/25/18 (!) 102  05/24/18 65  04/20/18 79    Renal function: CrCl cannot be calculated (Patient's most recent lab result is older than the maximum 21 days allowed.).  Past Medical History:  Diagnosis Date  . Cancer ([place])    right breast  . Diabetes mellitus    type 2. 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 Diverticulosis of colon   . DJD (degenerative joint disease) of  knee    Left  . Hyperlipidemia   . Hypertension   . Osteoporosis   . Personal history of radiation therapy   . Positive PPD    history off    Current Outpatient Medications on File Prior to Visit  Medication Sig Dispense Refill  . cloNIDine (CATAPRES) 0.1 MG tablet Take 1 tablet (0.1 mg total) by mouth every evening. 90 tablet 3  . spironolactone (ALDACTONE) 50 MG tablet TAKE 1 TABLET(50 MG) BY MOUTH DAILY 90 tablet 3   No current  facility-administered medications on file prior to visit.     Allergies  Allergen Reactions  . Hctz [Hydrochlorothiazide] Other (See Comments)    Reactions: sweating, heart pain, itching  . Amlodipine Besylate     Muscle aches  . Chlorthalidone Other (See Comments)    Muscle aches  . Olmesartan     headache  . Alendronate Sodium     REACTION: heartburn  . Lisinopril     REACTION: dizziness  . Bystolic [Nebivolol Hcl] Itching and Rash    There were no vitals taken for this visit.   Assessment/Plan: Hypertension: BP today is controlled and at goal<140/90. I recommended to make no changes, as BP in office was controlled, and have patient come back in 1.5 weeks with blood pressure cuff and home readings. Patient originally agreeable to this plan, but when reviewing AVS patient was very concerned about not being on a blood pressure medication (patient calls clonidine her sleeping pill). States she is going to stroke. Explained that her blood pressure was controlled today. Patient insistent. Discussed her multiple drug intolerances and options for medication changes. Start hydralazine TID, titrate clonidine, or add an ARB. Patient had headache to olmesartan in the past. Explained the possibility of having a headache again, but also the possibility that she could tolerate the similar medication just fine. Patient agreeable to trying valsartan. Will start with lowest dose 40mg  daily. Patient to return to clinic in ~2 weeks for BP recheck. Asked to check her BP daily and bring her log and meter with her to next appointment. Thank you,  Ramond Dial, Pharm.D, [facility]  [address], [place], [place] [address]  Phone: [phone]; Fax: [phone]  12/26/2018 7:44 AM

## 2018-12-29 ENCOUNTER — Telehealth: Payer: Self-pay

## 2018-12-29 NOTE — Telephone Encounter (Signed)
Unable to lmom for prescreen  

## 2019-01-05 ENCOUNTER — Ambulatory Visit: Payer: Medicare HMO

## 2019-01-05 ENCOUNTER — Other Ambulatory Visit: Payer: Self-pay

## 2019-01-05 ENCOUNTER — Telehealth: Payer: Self-pay | Admitting: Cardiology

## 2019-01-05 ENCOUNTER — Telehealth: Payer: Self-pay | Admitting: Interventional Cardiology

## 2019-01-05 ENCOUNTER — Encounter: Payer: Self-pay | Admitting: Interventional Cardiology

## 2019-01-05 NOTE — Telephone Encounter (Signed)

## 2019-01-05 NOTE — Telephone Encounter (Signed)
Pt arrived for visit today with the pharmacist but she is upset with the medication that was prescribed by the pharmacist on last visit. She says that she had a reaction to that med in the past and is again have a rash related to the valsartan. She does not want to see the pharmacist again and wants Dr. Tamala Julian to manage her BP meds. I tried to discuss the difficulty managing her HTN due to multiple medication intolerances and that is why she was sent to the pharmacist but she still wants Dr. Tamala Julian to handle it.  I have advised to her to make an appointment with Dr. Tamala Julian and I will route this note to him.

## 2019-01-05 NOTE — Telephone Encounter (Signed)
Add her to schedule over next 4-6 weeks. No emergency/urgency

## 2019-01-05 NOTE — Progress Notes (Deleted)
Patient ID: [name]                 DOB: 12-26-1928                      MRN: [id number]     HPI: Brittany [name] is a 85 y.o. female patient of Dr. Tamala [name] who presents today for hypertension follow up. PMH significant for R breast cancer, allergic rhinitis, T2DM, OA L knee, HTN, and HLD. Pt has a history of multiple medication intolerances noted below and was referred to Dr. Tamala [name] for further management. At last visit with HTN clinic, patients blood pressure was controlled despite spironolactone being discontinued due to reported rash/itching. Patient has a history of multiple drug intolerances, most of which start after about a year of treatment. After discussion, valsartan 40mg  daily was started per patient insistence that blood pressure was high at home. She was asked to bring her blood pressure meter and log to next appointment. She presents today for follow up.   Needs BMP  Dizziness, lightheadedness, blurred vision, headaches?  Current HTN meds: clonidine 0.1mg  at bedtime, valsartan 40mg  daily  Previously tried:amlodipine (muscle soreness), chlorthalidone (muscle aches), HCTZ (sweating, itching), lisinopril (dizziness), olmesartan (HA), diltiazem (stopped by MDto change to amlodipine), clonidine 0.1mg  TID (pt self-discontinued), spironolactone 50mg  daily (rash, itching)  BP goal:<140/90 mmHg- targeting more conservative goal given age  Family History:The patient's family history includes Cancer in her unknown relative; Coronary artery disease in her unknown relative; Diabetes in her father and unknown relative; Heart disease in her mother; Hypertension in her unknown relative; Lupus in her unknown relative; Stroke in her unknown relative.  Social History:never smoker, denies alcohol or illicit drug use  Diet:morning meal (~11:00) - cereal and banana, cheese sandwich; evening meal (~5:00) - salads with egg, cheese, spinach, broccoli; sometimes adds salt to food; avoids  fried foods; eats cheetos occasionally; mostly cooks own meals. One cup of coffee/day.  Exercise:walks and does leg workouts every other day for 30 minutes  Home BP readings:   Wt Readings from Last 3 Encounters:  08/25/18 170 lb (77.1 kg)  05/24/18 177 lb 4 oz (80.4 kg)  04/20/18 178 lb (80.7 kg)   BP Readings from Last 3 Encounters:  12/26/18 130/90  08/25/18 130/80  05/24/18 138/86   Pulse Readings from Last 3 Encounters:  12/26/18 79  08/25/18 (!) 102  05/24/18 65    Renal function: CrCl cannot be calculated (Patient's most recent lab result is older than the maximum 21 days allowed.).  Past Medical History:  Diagnosis Date  . Cancer ([place])    right breast  . Diabetes mellitus    type 2. 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 Diverticulosis of colon   . DJD (degenerative joint disease) of knee    Left  . Hyperlipidemia   . Hypertension   . Osteoporosis   . Personal history of radiation therapy   . Positive PPD    history off    Current Outpatient Medications on File Prior to Visit  Medication Sig Dispense Refill  . cloNIDine (CATAPRES) 0.1 MG tablet Take 1 tablet (0.1 mg total) by mouth every evening. 90 tablet 3  . valsartan (DIOVAN) 40 MG tablet Take 1 tablet (40 mg total) by mouth daily. 30 tablet 11   No current facility-administered medications on file prior to visit.     Allergies  Allergen Reactions  . Hctz [Hydrochlorothiazide] Other (See Comments)  Reactions: sweating, heart pain, itching  . Amlodipine Besylate     Muscle aches  . Chlorthalidone Other (See Comments)    Muscle aches  . Olmesartan     headache  . Alendronate Sodium     REACTION: heartburn  . Lisinopril     REACTION: dizziness  . Bystolic [Nebivolol Hcl] Itching and Rash    There were no vitals taken for this visit.   Assessment/Plan: Hypertension: BP today is controlled and at goal<140/90.  Thank you,  Brittany [name], Pharm.D, [place]  [address], [place], [place] [address]  Phone: [phone]; Fax: [phone]  01/05/2019 7:44 AM

## 2019-01-06 ENCOUNTER — Ambulatory Visit (INDEPENDENT_AMBULATORY_CARE_PROVIDER_SITE_OTHER): Payer: Medicare HMO | Admitting: Interventional Cardiology

## 2019-01-06 ENCOUNTER — Encounter: Payer: Self-pay | Admitting: Interventional Cardiology

## 2019-01-06 VITALS — BP 144/90 | HR 85 | Ht <= 58 in | Wt 163.0 lb

## 2019-01-06 DIAGNOSIS — I10 Essential (primary) hypertension: Secondary | ICD-10-CM

## 2019-01-06 DIAGNOSIS — L299 Pruritus, unspecified: Secondary | ICD-10-CM | POA: Diagnosis not present

## 2019-01-06 DIAGNOSIS — Z789 Other specified health status: Secondary | ICD-10-CM | POA: Diagnosis not present

## 2019-01-06 NOTE — Patient Instructions (Addendum)
Medication Instructions:  1) DISCONTINUE Clonidine 2) HOLD all blood pressure medications for one month and let us know if improvement in the itching.  In the meantime, Dr. Tamala Julian would like for you to see your Primary Care Physician about the itching. 3) If your blood pressure is 160/90 or higher, you may take one of your Valsartan 40mg  tablets.  If you need a refill on your cardiac medications before your next appointment, please call your pharmacy.   Lab work: None If you have labs (blood work) drawn today and your tests are completely normal, you will receive your results only by: Marland Kitchen MyChart Message (if you have MyChart) OR . A paper copy in the mail If you have any lab test that is abnormal or we need to change your treatment, we will call you to review the results.  Testing/Procedures: None  Follow-Up: At [facility], you and your health needs are our priority.  As part of our continuing mission to provide you with exceptional heart care, we have created designated Provider Care Teams.  These Care Teams include your primary Cardiologist (physician) and Advanced Practice Providers (APPs -  Physician Assistants and Nurse Practitioners) who all work together to provide you with the care you need, when you need it. You will need a follow up appointment in 6 months.  Please call our office 2 months in advance to schedule this appointment.  You may see Sinclair Grooms, MD or one of the following Advanced Practice Providers on your designated Care Team:   Truitt Merle, NP Cecilie Kicks, NP . Kathyrn Drown, NP  Any Other Special Instructions Will Be Listed Below (If Applicable).

## 2019-01-06 NOTE — Progress Notes (Signed)
Cardiology Office Note:    Date:  01/06/2019   ID:  Brittany BEHAN, DOB 06-25-1929, MRN [id number]  PCP:  Janith Lima, MD  Cardiologist:  Sinclair Grooms, MD   Referring MD: Janith Lima, MD   Chief Complaint  Patient presents with  . Hypertension  . Advice Only    Medication intolerance    History of Present Illness:    Brittany [name] is a 85 y.o. female with a hx of difficult to control hypertension.  Also has a history of hyperlipidemia, type 2 diabetes, and medication intolerance.  Long list of intolerances including amlodipine (amlodipine), chlorthalidone (muscle aches), olmesartan (headache), lisinopril (dizziness), Bystolic (itching), spironolactone (itching), and HCTZ (swelling/heart pain/itching).  Now complaining of itching on prior medication regimen.  Demanding to be seen only by MD.  Currently on minimal therapy with valsartan 40 mg/day and clonidine 0.1 mg/day.  Spironolactone was discontinued because of itching.  Blood pressures at home for the most part on this regimen have been less than 140/80 mmHg.   Past Medical History:  Diagnosis Date  . Cancer ([place])    right breast  . Diabetes mellitus    type 2. 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 Diverticulosis of colon   . DJD (degenerative joint disease) of knee    Left  . Hyperlipidemia   . Hypertension   . Osteoporosis   . Personal history of radiation therapy   . Positive PPD    history off    Past Surgical History:  Procedure Laterality Date  . BREAST LUMPECTOMY Right 2010  . BREAST SURGERY  2010   + RT    Current Medications: Current Meds  Medication Sig  . valsartan (DIOVAN) 40 MG tablet Take 1 tablet (40 mg total) by mouth daily.  . [DISCONTINUED] cloNIDine (CATAPRES) 0.1 MG tablet Take 1 tablet (0.1 mg total) by mouth every evening.     Allergies:   Hctz [hydrochlorothiazide], Amlodipine besylate, Chlorthalidone, Olmesartan, Alendronate sodium, Lisinopril,  Spironolactone, and Bystolic [nebivolol hcl]   Social History   Socioeconomic History  . Marital status: Widowed    Spouse name: Not on file  . Number of children: 4  . Years of education: Not on file  . Highest education level: Not on file  Occupational History  . Occupation: CNA    Comment: [employer]- home care [place]  . Financial resource strain: Not on file  . Food insecurity    Worry: Not on file    Inability: Not on file  . Transportation needs    Medical: Not on file    Non-medical: Not on file  Tobacco Use  . Smoking status: Never Smoker  . Smokeless tobacco: Never Used  Substance and Sexual Activity  . Alcohol use: No  . Drug use: No  . Sexual activity: Never  Lifestyle  . Physical activity    Days per week: Not on file    Minutes per session: Not on file  . Stress: Not on file  Relationships  . Social Herbalist on phone: Not on file    Gets together: Not on file    Attends religious service: Not on file    Active member of club or organization: Not on file    Attends meetings of clubs or organizations: Not on file    Relationship status: Not on file  Other Topics Concern  . 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  ROS:   Please see the history of present illness.    Multiple somatic complaints.  Has seen a dermatologist who could not find any cause for her itching.  All other systems reviewed and are negative.  EKGs/Labs/Other Studies Reviewed:    The following studies were reviewed today: No new data  EKG:  EKG normal sinus rhythm, left atrial  abnormality.  Relatively low voltage.  Recent Labs: 02/22/2018: ALT 14; TSH 2.40 05/24/2018: Hemoglobin 13.9; Platelets 403.0 09/06/2018: BUN 11; Creatinine, Ser 1.04; Potassium 4.7; Sodium 139  Recent Lipid Panel    Component Value Date/Time   CHOL 207 (H) 02/22/2018 1113   TRIG 82.0 02/22/2018 1113   TRIG 70 07/12/2006 0949   HDL 63.60 02/22/2018 1113   CHOLHDL 3 02/22/2018 1113   VLDL 16.4 02/22/2018 1113   LDLCALC 127 (H) 02/22/2018 1113   LDLDIRECT 119.8 04/02/2008 0000    Physical Exam:    VS:  BP (!) 144/90   Pulse 85   Ht 4\' 6"  (1.372 m)   Wt 163 lb (73.9 kg)   SpO2 97%   BMI 39.30 kg/m     Wt Readings from Last 3 Encounters:  01/06/19 163 lb (73.9 kg)  08/25/18 170 lb (77.1 kg)  05/24/18 177 lb 4 oz (80.4 kg)     GEN: Elderly and appears younger than stated age. No acute distress HEENT: Normal NECK: No JVD. LYMPHATICS: No lymphadenopathy CARDIAC:  RRR without murmur, gallop, or edema. VASCULAR: Normal Pulses. No bruits. RESPIRATORY:  Clear to auscultation without rales, wheezing or rhonchi  ABDOMEN: Soft, non-tender, non-distended, No pulsatile mass, MUSCULOSKELETAL: No deformity  SKIN: Warm and dry NEUROLOGIC:  Alert and oriented x 3 PSYCHIATRIC:  Normal affect   ASSESSMENT:    1. Essential hypertension, benign   2. Pruritus   3. Medication intolerance    PLAN:    In order of problems listed above:  1. Discontinue clonidine and valsartan.  Monitor blood pressure on no therapy.  Except 160/90 mmHg or less.  If either is above that level, use valsartan 40 mg.  At least 2 weeks off medication will help determine if itching has anything to do with therapy.  I have advised that she stay off for at least 4 weeks. 2. Discontinue medication for 4 weeks.  Further instruction based upon course of pruritus.  May need to discuss with her primary care physician. 3. Many of her medication intolerances are not valid.  Target BP: <130/80 mmHg  Diet and  lifestyle measures for BP control were reviewed in detail: Low sodium diet (<2.5 gm daily); alcohol restriction (<3 ounces per day); weight loss (Mediterranean); avoid non-steroidal agents; > 6 hours sleep per day; 150 min moderate exercise per week. Medical regimen will include at least 2 agents. Resistant hypertension if not controlled on 3 agents. Consider further evaluation: Sleep study to r/o OSA; Renal angiogram; Primary hyperaldonism and Pheochromocytoma w/u. After 3 agents, consider MRA (spironolactone)/ Epleronone), hydralazine, beta-blocker, and Minoxidil if not already in use due to patient profile.    Medication Adjustments/Labs and Tests Ordered: Current medicines are reviewed  at length with the patient today.  Concerns regarding medicines are outlined above.  Orders Placed This Encounter  Procedures  . EKG 12-Lead   No orders of the defined types were placed in this encounter.   Patient Instructions  Medication Instructions:  1) DISCONTINUE Clonidine 2) HOLD all blood pressure medications for one month and let us know if improvement in the itching.  In the meantime, Dr. Tamala Julian would like for you to see your Primary Care Physician about the itching. 3) If your blood pressure is 160/90 or higher, you may take one of your Valsartan 40mg  tablets.  If you need a refill on your cardiac medications before your next appointment, please call your pharmacy.   Lab work: None If you have labs (blood work) drawn today and your tests are completely normal, you will receive your results only by: Marland Kitchen MyChart Message (if you have MyChart) OR . A paper copy in the mail If you have any lab test that is abnormal or we need to change your treatment, we will call you to review the results.  Testing/Procedures: None  Follow-Up: At [facility], you and your health needs are our priority.  As part of our continuing mission to provide you with exceptional heart care, we have created designated  Provider Care Teams.  These Care Teams include your primary Cardiologist (physician) and Advanced Practice Providers (APPs -  Physician Assistants and Nurse Practitioners) who all work together to provide you with the care you need, when you need it. You will need a follow up appointment in 6 months.  Please call our office 2 months in advance to schedule this appointment.  You may see Sinclair Grooms, MD or one of the following Advanced Practice Providers on your designated Care Team:   Truitt Merle, NP Cecilie Kicks, NP . Kathyrn Drown, NP  Any Other Special Instructions Will Be Listed Below (If Applicable).       Signed, Sinclair Grooms, MD  01/06/2019 9:08 AM    [place]

## 2019-01-09 ENCOUNTER — Telehealth: Payer: Self-pay | Admitting: Internal Medicine

## 2019-01-09 NOTE — Telephone Encounter (Signed)
General/Other - Message/Call back  The patient needs to get in touch with Dr. Jenny Reichmann about what her other doctor is doing. She would like a call bach=k at [phone]

## 2019-01-10 NOTE — Telephone Encounter (Signed)
FYI:   Pt went to see Dr. Tamala Julian (cards) - discontinued the valsartan and clonidine. Pt to monitor bp with no therapy.

## 2019-01-20 ENCOUNTER — Other Ambulatory Visit: Payer: Self-pay | Admitting: Internal Medicine

## 2019-01-20 DIAGNOSIS — Z1231 Encounter for screening mammogram for malignant neoplasm of breast: Secondary | ICD-10-CM

## 2019-01-23 ENCOUNTER — Other Ambulatory Visit (INDEPENDENT_AMBULATORY_CARE_PROVIDER_SITE_OTHER): Payer: Medicare HMO

## 2019-01-23 ENCOUNTER — Ambulatory Visit (INDEPENDENT_AMBULATORY_CARE_PROVIDER_SITE_OTHER): Payer: Medicare HMO | Admitting: Internal Medicine

## 2019-01-23 ENCOUNTER — Encounter: Payer: Self-pay | Admitting: Internal Medicine

## 2019-01-23 ENCOUNTER — Other Ambulatory Visit: Payer: Self-pay

## 2019-01-23 VITALS — BP 148/92 | HR 91 | Temp 98.6°F | Resp 16 | Ht <= 58 in | Wt 172.0 lb

## 2019-01-23 DIAGNOSIS — I10 Essential (primary) hypertension: Secondary | ICD-10-CM

## 2019-01-23 DIAGNOSIS — E118 Type 2 diabetes mellitus with unspecified complications: Secondary | ICD-10-CM

## 2019-01-23 DIAGNOSIS — E785 Hyperlipidemia, unspecified: Secondary | ICD-10-CM | POA: Diagnosis not present

## 2019-01-23 DIAGNOSIS — E559 Vitamin D deficiency, unspecified: Secondary | ICD-10-CM

## 2019-01-23 DIAGNOSIS — R350 Frequency of micturition: Secondary | ICD-10-CM | POA: Diagnosis not present

## 2019-01-23 DIAGNOSIS — D508 Other iron deficiency anemias: Secondary | ICD-10-CM

## 2019-01-23 DIAGNOSIS — Z6841 Body Mass Index (BMI) 40.0 and over, adult: Secondary | ICD-10-CM | POA: Diagnosis not present

## 2019-01-23 LAB — IBC PANEL
Iron: 79 ug/dL (ref 42–145)
Saturation Ratios: 24.4 % (ref 20.0–50.0)
Transferrin: 231 mg/dL (ref 212.0–360.0)

## 2019-01-23 LAB — CBC WITH DIFFERENTIAL/PLATELET
Basophils Absolute: 0.1 10*3/uL (ref 0.0–0.1)
Basophils Relative: 1.2 % (ref 0.0–3.0)
Eosinophils Absolute: 0.1 10*3/uL (ref 0.0–0.7)
Eosinophils Relative: 2.8 % (ref 0.0–5.0)
HCT: 42.1 % (ref 36.0–46.0)
Hemoglobin: 13.7 g/dL (ref 12.0–15.0)
Lymphocytes Relative: 34.7 % (ref 12.0–46.0)
Lymphs Abs: 1.6 10*3/uL (ref 0.7–4.0)
MCHC: 32.5 g/dL (ref 30.0–36.0)
MCV: 89.4 fl (ref 78.0–100.0)
Monocytes Absolute: 0.4 10*3/uL (ref 0.1–1.0)
Monocytes Relative: 7.8 % (ref 3.0–12.0)
Neutro Abs: 2.4 10*3/uL (ref 1.4–7.7)
Neutrophils Relative %: 53.5 % (ref 43.0–77.0)
Platelets: 345 10*3/uL (ref 150.0–400.0)
RBC: 4.71 Mil/uL (ref 3.87–5.11)
RDW: 13.5 % (ref 11.5–15.5)
WBC: 4.5 10*3/uL (ref 4.0–10.5)

## 2019-01-23 LAB — TSH: TSH: 2.85 u[IU]/mL (ref 0.35–4.50)

## 2019-01-23 LAB — LIPID PANEL
Cholesterol: 206 mg/dL — ABNORMAL HIGH (ref 0–200)
HDL: 56.4 mg/dL (ref >39.00)
LDL Cholesterol: 123 mg/dL — ABNORMAL HIGH (ref 0–99)
NonHDL: 149.31
Total CHOL/HDL Ratio: 4
Triglycerides: 130 mg/dL (ref 0.0–149.0)
VLDL: 26 mg/dL (ref 0.0–40.0)

## 2019-01-23 LAB — MICROALBUMIN / CREATININE URINE RATIO
Creatinine,U: 57.8 mg/dL
Microalb Creat Ratio: 1.2 mg/g (ref 0.0–30.0)
Microalb, Ur: <0.7 mg/dL (ref 0.0–1.9)

## 2019-01-23 LAB — BASIC METABOLIC PANEL
BUN: 9 mg/dL (ref 6–23)
CO2: 27 mEq/L (ref 19–32)
Calcium: 10.1 mg/dL (ref 8.4–10.5)
Chloride: 104 mEq/L (ref 96–112)
Creatinine, Ser: 0.93 mg/dL (ref 0.40–1.20)
GFR: 68.8 mL/min (ref >60.00)
Glucose, Bld: 100 mg/dL — ABNORMAL HIGH (ref 70–99)
Potassium: 4.4 mEq/L (ref 3.5–5.1)
Sodium: 139 mEq/L (ref 135–145)

## 2019-01-23 LAB — URINALYSIS, ROUTINE W REFLEX MICROSCOPIC
Bilirubin Urine: NEGATIVE
Hgb urine dipstick: NEGATIVE
Ketones, ur: NEGATIVE
Nitrite: NEGATIVE
RBC / HPF: NONE SEEN (ref 0–?)
Specific Gravity, Urine: 1.015 (ref 1.000–1.030)
Total Protein, Urine: NEGATIVE
Urine Glucose: NEGATIVE
Urobilinogen, UA: 0.2 (ref 0.0–1.0)
pH: 7.5 (ref 5.0–8.0)

## 2019-01-23 LAB — HEMOGLOBIN A1C: Hgb A1c MFr Bld: 6.3 % (ref 4.6–6.5)

## 2019-01-23 LAB — VITAMIN D 25 HYDROXY (VIT D DEFICIENCY, FRACTURES): VITD: 46.84 ng/mL (ref 30.00–100.00)

## 2019-01-23 LAB — FERRITIN: Ferritin: 34 ng/mL (ref 10.0–291.0)

## 2019-01-23 NOTE — Patient Instructions (Signed)
Type 2 Diabetes Mellitus, Diagnosis, Adult Type 2 diabetes (type 2 diabetes mellitus) is a long-term (chronic) disease. In type 2 diabetes, one or both of these problems may be present:  The pancreas does not make enough of a hormone called insulin.  Cells in the body do not respond properly to insulin that the body makes (insulin resistance). Normally, insulin allows blood sugar (glucose) to enter cells in the body. The cells use glucose for energy. Insulin resistance or lack of insulin causes excess glucose to build up in the blood instead of going into cells. As a result, high blood glucose (hyperglycemia) develops. What increases the risk? The following factors may make you more likely to develop type 2 diabetes:  Having a family member with type 2 diabetes.  Being overweight or obese.  Having an inactive (sedentary) lifestyle.  Having been diagnosed with insulin resistance.  Having a history of prediabetes, gestational diabetes, or polycystic ovary syndrome (PCOS).  Being of American-Indian, African-American, Hispanic/Latino, or Asian/Pacific Islander descent. What are the signs or symptoms? In the early stage of this condition, you may not have symptoms. Symptoms develop slowly and may include:  Increased thirst (polydipsia).  Increased hunger(polyphagia).  Increased urination (polyuria).  Increased urination during the night (nocturia).  Unexplained weight loss.  Frequent infections that keep coming back (recurring).  Fatigue.  Weakness.  Vision changes, such as blurry vision.  Cuts or bruises that are slow to heal.  Tingling or numbness in the hands or feet.  Dark patches on the skin (acanthosis nigricans). How is this diagnosed? This condition is diagnosed based on your symptoms, your medical history, a physical exam, and your blood glucose level. Your blood glucose may be checked with one or more of the following blood tests:  A fasting blood glucose (FBG)  test. You will not be allowed to eat (you will fast) for 8 hours or longer before a blood sample is taken.  A random blood glucose test. This test checks blood glucose at any time of day regardless of when you ate.  An A1c (hemoglobin A1c) blood test. This test provides information about blood glucose control over the previous 2-3 months.  An oral glucose tolerance test (OGTT). This test measures your blood glucose at two times: ? After fasting. This is your baseline blood glucose level. ? Two hours after drinking a beverage that contains glucose. You may be diagnosed with type 2 diabetes if:  Your FBG level is 126 mg/dL (7.0 mmol/L) or higher.  Your random blood glucose level is 200 mg/dL (11.1 mmol/L) or higher.  Your A1c level is 6.5% or higher.  Your OGTT result is higher than 200 mg/dL (11.1 mmol/L). These blood tests may be repeated to confirm your diagnosis. How is this treated? Your treatment may be managed by a specialist called an endocrinologist. Type 2 diabetes may be treated by following instructions from your health care provider about:  Making diet and lifestyle changes. This may include: ? Following an individualized nutrition plan that is developed by a diet and nutrition specialist (registered dietitian). ? Exercising regularly. ? Finding ways to manage stress.  Checking your blood glucose level as often as told.  Taking diabetes medicines or insulin daily. This helps to keep your blood glucose levels in the healthy range. ? If you use insulin, you may need to adjust the dosage depending on how physically active you are and what foods you eat. Your health care provider will tell you how to adjust your dosage.    Taking medicines to help prevent complications from diabetes, such as: ? Aspirin. ? Medicine to lower cholesterol. ? Medicine to control blood pressure. Your health care provider will set individualized treatment goals for you. Your goals will be based on  your age, other medical conditions you have, and how you respond to diabetes treatment. Generally, the goal of treatment is to maintain the following blood glucose levels:  Before meals (preprandial): 80-130 mg/dL (4.4-7.2 mmol/L).  After meals (postprandial): below 180 mg/dL (10 mmol/L).  A1c level: less than 7%. Follow these instructions at home: Questions to ask your health care provider  Consider asking the following questions: ? Do I need to meet with a diabetes educator? ? Where can I find a support group for people with diabetes? ? What equipment will I need to manage my diabetes at home? ? What diabetes medicines do I need, and when should I take them? ? How often do I need to check my blood glucose? ? What number can I call if I have questions? ? When is my next appointment? General instructions  Take over-the-counter and prescription medicines only as told by your health care provider.  Keep all follow-up visits as told by your health care provider. This is important.  For more information about diabetes, visit: ? American Diabetes Association (ADA): www.diabetes.org ? American Association of Diabetes Educators (AADE): www.diabeteseducator.org Contact a health care provider if:  Your blood glucose is at or above 240 mg/dL (13.3 mmol/L) for 2 days in a row.  You have been sick or have had a fever for 2 days or longer, and you are not getting better.  You have any of the following problems for more than 6 hours: ? You cannot eat or drink. ? You have nausea and vomiting. ? You have diarrhea. Get help right away if:  Your blood glucose is lower than 54 mg/dL (3.0 mmol/L).  You become confused or you have trouble thinking clearly.  You have difficulty breathing.  You have moderate or large ketone levels in your urine. Summary  Type 2 diabetes (type 2 diabetes mellitus) is a long-term (chronic) disease. In type 2 diabetes, the pancreas does not make enough of a  hormone called insulin, or cells in the body do not respond properly to insulin that the body makes (insulin resistance).  This condition is treated by making diet and lifestyle changes and taking diabetes medicines or insulin.  Your health care provider will set individualized treatment goals for you. Your goals will be based on your age, other medical conditions you have, and how you respond to diabetes treatment.  Keep all follow-up visits as told by your health care provider. This is important. This information is not intended to replace advice given to you by your health care provider. Make sure you discuss any questions you have with your health care provider. Document Released: 06/15/2005 Document Revised: 08/13/2017 Document Reviewed: 07/19/2015 Elsevier Patient Education  2020 [employer]  

## 2019-01-23 NOTE — Progress Notes (Signed)
Subjective:  Patient ID: Brittany [name], female    DOB: 09/13/1928  Age: 85 y.o. MRN: [id number]  CC: Hypertension, Hyperlipidemia, and Diabetes   HPI Brittany [name] presents for f/up - She complains of a 2-day history of dark urine with frequency and urgency.  Outpatient Medications Prior to Visit  Medication Sig Dispense Refill   valsartan (DIOVAN) 40 MG tablet Take 1 tablet (40 mg total) by mouth daily. 30 tablet 11   No facility-administered medications prior to visit.     ROS Review of Systems  Constitutional: Positive for unexpected weight change (wt gain). Negative for chills, diaphoresis, fatigue and fever.  HENT: Negative.   Eyes: Negative.  Negative for visual disturbance.  Respiratory: Negative.  Negative for cough, chest tightness, shortness of breath and wheezing.   Cardiovascular: Negative for chest pain, palpitations and leg swelling.  Gastrointestinal: Negative for abdominal pain, blood in stool, constipation, diarrhea, nausea and vomiting.  Endocrine: Negative.   Genitourinary: Positive for difficulty urinating, dysuria, frequency and urgency. Negative for decreased urine volume, flank pain, hematuria and pelvic pain.  Musculoskeletal: Negative.  Negative for arthralgias and myalgias.  Skin: Negative.  Negative for color change and rash.  Neurological: Negative for dizziness, weakness, light-headedness and headaches.  Hematological: Negative.   Psychiatric/Behavioral: Negative.     Objective:  BP (!) 148/92 (BP Location: Left Arm, Patient Position: Sitting, Cuff Size: Normal)    Pulse 91    Temp 98.6 F (37 C) (Oral)    Resp 16    Ht 4\' 6"  (1.372 m)    Wt 172 lb (78 kg)    SpO2 97%    BMI 41.47 kg/m   BP Readings from Last 3 Encounters:  01/23/19 (!) 148/92  01/06/19 (!) 144/90  12/26/18 130/90    Wt Readings from Last 3 Encounters:  01/23/19 172 lb (78 kg)  01/06/19 163 lb (73.9 kg)  08/25/18 170 lb (77.1 kg)    Physical Exam Vitals signs  reviewed.  Constitutional:      General: She is not in acute distress.    Appearance: She is not ill-appearing, toxic-appearing or diaphoretic.  HENT:     Nose: Nose normal.     Mouth/Throat:     Mouth: Mucous membranes are moist.  Eyes:     General: No scleral icterus.    Conjunctiva/sclera: Conjunctivae normal.  Neck:     Musculoskeletal: Normal range of motion and neck supple. No neck rigidity.  Cardiovascular:     Rate and Rhythm: Normal rate and regular rhythm.     Heart sounds: No murmur.  Pulmonary:     Effort: Pulmonary effort is normal.     Breath sounds: No stridor. No wheezing, rhonchi or rales.  Abdominal:     General: Abdomen is protuberant. Bowel sounds are normal. There is no distension.     Palpations: There is no hepatomegaly, splenomegaly or mass.     Tenderness: There is no abdominal tenderness.  Musculoskeletal: Normal range of motion.     Right lower leg: No edema.     Left lower leg: No edema.  Lymphadenopathy:     Cervical: No cervical adenopathy.  Skin:    General: Skin is warm.     Findings: No rash.  Neurological:     General: No focal deficit present.     Mental Status: She is oriented to person, place, and time. Mental status is at baseline.     Lab Results  Component Value Date  WBC 4.5 01/23/2019   HGB 13.7 01/23/2019   HCT 42.1 01/23/2019   PLT 345.0 01/23/2019   GLUCOSE 100 (H) 01/23/2019   CHOL 206 (H) 01/23/2019   TRIG 130.0 01/23/2019   HDL 56.40 01/23/2019   LDLDIRECT 119.8 04/02/2008   LDLCALC 123 (H) 01/23/2019   ALT 14 02/22/2018   AST 19 02/22/2018   NA 139 01/23/2019   K 4.4 01/23/2019   CL 104 01/23/2019   CREATININE 0.93 01/23/2019   BUN 9 01/23/2019   CO2 27 01/23/2019   TSH 2.85 01/23/2019   HGBA1C 6.3 01/23/2019   MICROALBUR <0.7 01/23/2019    Mm 3d Screen Breast Bilateral  Result Date: 02/17/2018 CLINICAL DATA:  Screening. EXAM: DIGITAL SCREENING BILATERAL MAMMOGRAM WITH TOMO AND CAD COMPARISON:  Previous  exam(s). ACR Breast Density Category a: The breast tissue is almost entirely fatty. FINDINGS: There are no findings suspicious for malignancy. Images were processed with CAD. IMPRESSION: No mammographic evidence of malignancy. A result letter of this screening mammogram will be mailed directly to the patient. RECOMMENDATION: Screening mammogram in one year. (Code:SM-B-01Y) BI-RADS CATEGORY  1: Negative. Electronically Signed   By: Dorise Bullion III M.D   On: 02/17/2018 10:59    Assessment & Plan:   Ceri was seen today for hypertension, hyperlipidemia and diabetes.  Diagnoses and all orders for this visit:  Type II diabetes mellitus with manifestations ([place])- She has achieved good glycemic control. -     Basic metabolic panel; Future -     Hemoglobin A1c; Future -     Urinalysis, Routine w reflex microscopic; Future -     Microalbumin / creatinine urine ratio; Future -     HM Diabetes Foot Exam  Essential hypertension, benign- Her blood pressure is adequately well controlled. -     Basic metabolic panel; Future -     TSH; Future -     Urinalysis, Routine w reflex microscopic; Future  Iron deficiency anemia secondary to inadequate dietary iron intake- Her H&H are normal now. -     IBC panel; Future -     CBC with Differential/Platelet; Future -     Ferritin; Future  Hyperlipidemia with target LDL less than 100- Statin treatment is not indicated at her age. -     Lipid panel; Future -     TSH; Future  Vitamin D deficiency -     VITAMIN D 25 Hydroxy (Vit-D Deficiency, Fractures); Future  Urinary frequency- Her UA is normal.  I will treat for UTI if the urine culture is positive. -     CULTURE, URINE COMPREHENSIVE; Future   I am having Brittany [name] maintain her valsartan.  No orders of the defined types were placed in this encounter.    Follow-up: Return in about 4 months (around 05/26/2019).  Scarlette Calico, MD

## 2019-01-25 LAB — CULTURE, URINE COMPREHENSIVE
MICRO NUMBER:: 706673
RESULT:: NO GROWTH
SPECIMEN QUALITY:: ADEQUATE

## 2019-03-08 ENCOUNTER — Ambulatory Visit
Admission: RE | Admit: 2019-03-08 | Discharge: 2019-03-08 | Disposition: A | Discharge status: Home / Self Care | Payer: Medicare HMO | Source: Ambulatory Visit | Attending: Internal Medicine | Admitting: Internal Medicine

## 2019-03-08 ENCOUNTER — Other Ambulatory Visit: Payer: Self-pay

## 2019-03-08 DIAGNOSIS — Z1231 Encounter for screening mammogram for malignant neoplasm of breast: Secondary | ICD-10-CM | POA: Diagnosis not present

## 2019-03-08 LAB — HM MAMMOGRAPHY

## 2019-09-20 ENCOUNTER — Ambulatory Visit (INDEPENDENT_AMBULATORY_CARE_PROVIDER_SITE_OTHER): Payer: Medicare HMO | Admitting: Internal Medicine

## 2019-09-20 ENCOUNTER — Other Ambulatory Visit: Payer: Self-pay

## 2019-09-20 ENCOUNTER — Encounter: Payer: Self-pay | Admitting: Internal Medicine

## 2019-09-20 VITALS — BP 176/102 | HR 90 | Temp 98.5°F | Resp 16 | Ht <= 58 in | Wt 180.0 lb

## 2019-09-20 DIAGNOSIS — E118 Type 2 diabetes mellitus with unspecified complications: Secondary | ICD-10-CM

## 2019-09-20 DIAGNOSIS — I10 Essential (primary) hypertension: Secondary | ICD-10-CM

## 2019-09-20 LAB — BASIC METABOLIC PANEL
BUN: 9 mg/dL (ref 6–23)
CO2: 26 mEq/L (ref 19–32)
Calcium: 9.7 mg/dL (ref 8.4–10.5)
Chloride: 104 mEq/L (ref 96–112)
Creatinine, Ser: 0.95 mg/dL (ref 0.40–1.20)
GFR: 67.03 mL/min (ref >60.00)
Glucose, Bld: 105 mg/dL — ABNORMAL HIGH (ref 70–99)
Potassium: 3.9 mEq/L (ref 3.5–5.1)
Sodium: 139 mEq/L (ref 135–145)

## 2019-09-20 LAB — HEMOGLOBIN A1C: Hgb A1c MFr Bld: 6 % (ref 4.6–6.5)

## 2019-09-20 MED ORDER — INDAPAMIDE 1.25 MG PO TABS: 1.2500 mg | ORAL_TABLET | Freq: Every day | ORAL | 0 refills | Status: DC

## 2019-09-20 NOTE — Patient Instructions (Signed)

## 2019-09-20 NOTE — Progress Notes (Signed)
Subjective:  Patient ID: Brittany [name], female    DOB: 1928/07/03  Age: 86 y.o. MRN: [id number]  CC: Hypertension and Diabetes  This visit occurred during the SARS-CoV-2 public health emergency.  Safety protocols were in place, including screening questions prior to the visit, additional usage of staff PPE, and extensive cleaning of exam room while observing appropriate contact time as indicated for disinfecting solutions.   HPI Brittany [name] presents for f/up - She is concerned that her blood pressure is not adequately well controlled.  Her systolic blood pressure at home was 200 mmHG 2 days ago.  She has had a few episodes of headache and dizziness.  She tells me she is compliant with the ARB but she has not been working on lifestyle modifications and has gained weight.  Outpatient Medications Prior to Visit  Medication Sig Dispense Refill  . valsartan (DIOVAN) 40 MG tablet Take 1 tablet (40 mg total) by mouth daily. 30 tablet 11   No facility-administered medications prior to visit.    ROS Review of Systems  Constitutional: Positive for unexpected weight change. Negative for diaphoresis and fatigue.  HENT: Negative.   Eyes: Negative.   Respiratory: Negative for cough, chest tightness, shortness of breath and wheezing.   Cardiovascular: Negative for chest pain, palpitations and leg swelling.  Gastrointestinal: Negative for abdominal pain, diarrhea, nausea and vomiting.  Endocrine: Negative.   Genitourinary: Negative.  Negative for difficulty urinating.  Musculoskeletal: Negative for arthralgias and myalgias.  Skin: Negative.  Negative for color change.  Neurological: Positive for dizziness and headaches. Negative for facial asymmetry, weakness, light-headedness and numbness.  Hematological: Negative for adenopathy. Does not bruise/bleed easily.  Psychiatric/Behavioral: Negative.     Objective:  BP (!) 176/102 (BP Location: Left Arm, Patient Position: Sitting, Cuff Size:  Normal)   Pulse 90   Temp 98.5 F (36.9 C) (Oral)   Resp 16   Ht 4\' 6"  (1.372 m)   Wt 180 lb (81.6 kg)   SpO2 99%   BMI 43.40 kg/m   BP Readings from Last 3 Encounters:  09/20/19 (!) 176/102  01/23/19 (!) 148/92  01/06/19 (!) 144/90    Wt Readings from Last 3 Encounters:  09/20/19 180 lb (81.6 kg)  01/23/19 172 lb (78 kg)  01/06/19 163 lb (73.9 kg)    Physical Exam Vitals reviewed.  Constitutional:      General: She is not in acute distress.    Appearance: She is obese. She is not ill-appearing, toxic-appearing or diaphoretic.  HENT:     Nose: Nose normal.     Mouth/Throat:     Mouth: Mucous membranes are moist.  Eyes:     General: No scleral icterus.    Conjunctiva/sclera: Conjunctivae normal.  Cardiovascular:     Rate and Rhythm: Normal rate and regular rhythm.     Heart sounds: No murmur.  Pulmonary:     Effort: Pulmonary effort is normal.     Breath sounds: No stridor. No wheezing, rhonchi or rales.  Abdominal:     General: Abdomen is protuberant. Bowel sounds are normal. There is no distension.     Palpations: Abdomen is soft. There is no hepatomegaly, splenomegaly or mass.     Tenderness: There is no abdominal tenderness.  Musculoskeletal:        General: Normal range of motion.     Cervical back: Neck supple.     Right lower leg: No edema.     Left lower leg: No edema.  Lymphadenopathy:     Cervical: No cervical adenopathy.  Skin:    General: Skin is warm and dry.     Coloration: Skin is not pale.  Neurological:     General: No focal deficit present.     Mental Status: She is alert.  Psychiatric:        Mood and Affect: Mood normal.        Behavior: Behavior normal.     Lab Results  Component Value Date   WBC 4.5 01/23/2019   HGB 13.7 01/23/2019   HCT 42.1 01/23/2019   PLT 345.0 01/23/2019   GLUCOSE 105 (H) 09/20/2019   CHOL 206 (H) 01/23/2019   TRIG 130.0 01/23/2019   HDL 56.40 01/23/2019   LDLDIRECT 119.8 04/02/2008   LDLCALC 123 (H)  01/23/2019   ALT 14 02/22/2018   AST 19 02/22/2018   NA 139 09/20/2019   K 3.9 09/20/2019   CL 104 09/20/2019   CREATININE 0.95 09/20/2019   BUN 9 09/20/2019   CO2 26 09/20/2019   TSH 2.85 01/23/2019   HGBA1C 6.0 09/20/2019   MICROALBUR <0.7 01/23/2019    MM 3D SCREEN BREAST BILATERAL  Result Date: 03/08/2019 CLINICAL DATA:  Screening. EXAM: DIGITAL SCREENING BILATERAL MAMMOGRAM WITH TOMO AND CAD COMPARISON:  Previous exam(s). ACR Breast Density Category a: The breast tissue is almost entirely fatty. FINDINGS: There are no findings suspicious for malignancy. Images were processed with CAD. IMPRESSION: No mammographic evidence of malignancy. A result letter of this screening mammogram will be mailed directly to the patient. RECOMMENDATION: Screening mammogram in one year. (Code:SM-B-01Y) BI-RADS CATEGORY  1: Negative. Electronically Signed   By: Lajean Manes M.D.   On: 03/08/2019 13:14    Assessment & Plan:   Brittany [name] was seen today for hypertension and diabetes.  Diagnoses and all orders for this visit:  Essential hypertension, benign- Her blood pressure is not adequately well controlled and she is symptomatic.  Her electrolytes and renal function are normal.  I have asked her to add indapamide to the ARB. -     Basic metabolic panel; Future -     indapamide (LOZOL) 1.25 MG tablet; Take 1 tablet (1.25 mg total) by mouth daily. -     Basic metabolic panel  Type II diabetes mellitus with manifestations ([place])- Her blood sugar is adequately well controlled. -     Basic metabolic panel; Future -     Hemoglobin A1c; Future -     Hemoglobin A1c -     Basic metabolic panel   I am having Brittany [name] start on indapamide. I am also having her maintain her valsartan.  Meds ordered this encounter  Medications  . indapamide (LOZOL) 1.25 MG tablet    Sig: Take 1 tablet (1.25 mg total) by mouth daily.    Dispense:  90 tablet    Refill:  0     Follow-up: Return in about 6 weeks (around  11/01/2019).  Scarlette Calico, MD

## 2019-10-16 DIAGNOSIS — H52202 Unspecified astigmatism, left eye: Secondary | ICD-10-CM | POA: Diagnosis not present

## 2019-10-16 DIAGNOSIS — H35033 Hypertensive retinopathy, bilateral: Secondary | ICD-10-CM | POA: Diagnosis not present

## 2019-10-16 DIAGNOSIS — E119 Type 2 diabetes mellitus without complications: Secondary | ICD-10-CM | POA: Diagnosis not present

## 2019-10-16 DIAGNOSIS — H43812 Vitreous degeneration, left eye: Secondary | ICD-10-CM | POA: Diagnosis not present

## 2019-10-16 LAB — HM DIABETES EYE EXAM

## 2019-10-25 ENCOUNTER — Encounter: Payer: Self-pay | Admitting: Internal Medicine

## 2019-11-01 ENCOUNTER — Ambulatory Visit (INDEPENDENT_AMBULATORY_CARE_PROVIDER_SITE_OTHER): Payer: Medicare HMO | Admitting: Internal Medicine

## 2019-11-01 ENCOUNTER — Other Ambulatory Visit: Payer: Self-pay | Admitting: Internal Medicine

## 2019-11-01 ENCOUNTER — Encounter: Payer: Self-pay | Admitting: Internal Medicine

## 2019-11-01 ENCOUNTER — Other Ambulatory Visit: Payer: Self-pay

## 2019-11-01 VITALS — BP 164/102 | HR 86 | Temp 97.9°F | Resp 16 | Ht <= 58 in | Wt 177.0 lb

## 2019-11-01 DIAGNOSIS — I10 Essential (primary) hypertension: Secondary | ICD-10-CM

## 2019-11-01 MED ORDER — CLONIDINE 0.1 MG/24HR TD PTWK: 0.1000 mg | MEDICATED_PATCH | TRANSDERMAL | 0 refills | Status: DC

## 2019-11-01 MED ORDER — VALSARTAN 160 MG PO TABS: 160.0000 mg | ORAL_TABLET | Freq: Every day | ORAL | 1 refills | Status: DC

## 2019-11-01 NOTE — Progress Notes (Signed)
Subjective:  Patient ID: Brittany [name], female    DOB: [birth year]  Age: 86 y.o. MRN: [id number]  CC: Hypertension  This visit occurred during the SARS-CoV-2 public health emergency.  Safety protocols were in place, including screening questions prior to the visit, additional usage of staff PPE, and extensive cleaning of exam room while observing appropriate contact time as indicated for disinfecting solutions.    HPI Brittany [name] presents for f/up - She is concerned that her blood pressure is not adequately well controlled.  She is not taking indapamide because it caused a rash.  She denies headache, blurred vision, chest pain, shortness of breath, palpitations, edema, or fatigue.  Outpatient Medications Prior to Visit  Medication Sig Dispense Refill  . valsartan (DIOVAN) 40 MG tablet Take 1 tablet (40 mg total) by mouth daily. 30 tablet 11  . indapamide (LOZOL) 1.25 MG tablet Take 1 tablet (1.25 mg total) by mouth daily. (Patient not taking: Reported on 11/01/2019) 90 tablet 0   No facility-administered medications prior to visit.    ROS Review of Systems  Constitutional: Negative for appetite change, diaphoresis and fatigue.  HENT: Negative.   Eyes: Negative for visual disturbance.  Respiratory: Negative for cough, chest tightness, shortness of breath and wheezing.   Cardiovascular: Negative for chest pain, palpitations and leg swelling.  Gastrointestinal: Negative for abdominal pain, constipation, diarrhea, nausea and vomiting.  Genitourinary: Negative.  Negative for difficulty urinating.  Musculoskeletal: Negative.  Negative for arthralgias and myalgias.  Skin: Negative.   Neurological: Negative for dizziness, weakness, light-headedness and headaches.  Hematological: Negative for adenopathy. Does not bruise/bleed easily.  Psychiatric/Behavioral: Negative.     Objective:  BP (!) 164/102 (BP Location: Left Arm, Patient Position: Sitting, Cuff Size: Normal) Comment: BP (R)  146/102 (L) 164/102  Pulse 86   Temp 97.9 F (36.6 C) (Oral)   Resp 16   Ht 4\' 6"  (1.372 m)   Wt 177 lb (80.3 kg)   SpO2 96%   BMI 42.68 kg/m   BP Readings from Last 3 Encounters:  11/01/19 (!) 164/102  09/20/19 (!) 176/102  01/23/19 (!) 148/92    Wt Readings from Last 3 Encounters:  11/01/19 177 lb (80.3 kg)  09/20/19 180 lb (81.6 kg)  01/23/19 172 lb (78 kg)    Physical Exam Vitals reviewed.  Constitutional:      Appearance: Normal appearance. She is not ill-appearing.  HENT:     Nose: Nose normal.     Mouth/Throat:     Mouth: Mucous membranes are moist.  Eyes:     General: No scleral icterus.    Conjunctiva/sclera: Conjunctivae normal.  Cardiovascular:     Rate and Rhythm: Normal rate and regular rhythm.     Heart sounds: No murmur.  Pulmonary:     Effort: Pulmonary effort is normal.     Breath sounds: No stridor. No wheezing, rhonchi or rales.  Abdominal:     General: Abdomen is flat.     Palpations: There is no mass.     Tenderness: There is no abdominal tenderness. There is no guarding.  Musculoskeletal:        General: Normal range of motion.     Cervical back: Neck supple.     Right lower leg: No edema.     Left lower leg: No edema.  Lymphadenopathy:     Cervical: No cervical adenopathy.  Skin:    General: Skin is warm and dry.     Coloration: Skin is not  pale.  Neurological:     General: No focal deficit present.     Mental Status: She is alert.  Psychiatric:        Mood and Affect: Mood normal.        Behavior: Behavior normal.     Lab Results  Component Value Date   WBC 4.5 01/23/2019   HGB 13.7 01/23/2019   HCT 42.1 01/23/2019   PLT 345.0 01/23/2019   GLUCOSE 105 (H) 09/20/2019   CHOL 206 (H) 01/23/2019   TRIG 130.0 01/23/2019   HDL 56.40 01/23/2019   LDLDIRECT 119.8 04/02/2008   LDLCALC 123 (H) 01/23/2019   ALT 14 02/22/2018   AST 19 02/22/2018   NA 139 09/20/2019   K 3.9 09/20/2019   CL 104 09/20/2019   CREATININE 0.95  09/20/2019   BUN 9 09/20/2019   CO2 26 09/20/2019   TSH 2.85 01/23/2019   HGBA1C 6.0 09/20/2019   MICROALBUR <0.7 01/23/2019    MM 3D SCREEN BREAST BILATERAL  Result Date: 03/08/2019 CLINICAL DATA:  Screening. EXAM: DIGITAL SCREENING BILATERAL MAMMOGRAM WITH TOMO AND CAD COMPARISON:  Previous exam(s). ACR Breast Density Category a: The breast tissue is almost entirely fatty. FINDINGS: There are no findings suspicious for malignancy. Images were processed with CAD. IMPRESSION: No mammographic evidence of malignancy. A result letter of this screening mammogram will be mailed directly to the patient. RECOMMENDATION: Screening mammogram in one year. (Code:SM-B-01Y) BI-RADS CATEGORY  1: Negative. Electronically Signed   By: Lajean Manes M.D.   On: 03/08/2019 13:14    Assessment & Plan:   [name] was seen today for hypertension.  Diagnoses and all orders for this visit:  Essential hypertension, benign- Her blood pressure is not adequately well controlled but fortunately she is asymptomatic.  She does not tolerate calcium channel blockers, diuretics, or beta-blockers.  Will increase the dose of valsartan and and will add on a clonidine patch. -     Discontinue: cloNIDine (CATAPRES - DOSED IN MG/24 HR) 0.1 mg/24hr patch; Place 1 patch (0.1 mg total) onto the skin once a week. -     valsartan (DIOVAN) 160 MG tablet; Take 1 tablet (160 mg total) by mouth daily.   I have discontinued Macky Lower. Orbach's valsartan and indapamide. I am also having her start on valsartan.  Meds ordered this encounter  Medications  . DISCONTD: cloNIDine (CATAPRES - DOSED IN MG/24 HR) 0.1 mg/24hr patch    Sig: Place 1 patch (0.1 mg total) onto the skin once a week.    Dispense:  12 patch    Refill:  0  . valsartan (DIOVAN) 160 MG tablet    Sig: Take 1 tablet (160 mg total) by mouth daily.    Dispense:  90 tablet    Refill:  1     Follow-up: Return in about 3 months (around 02/01/2020).  Scarlette Calico, MD

## 2019-11-01 NOTE — Patient Instructions (Signed)

## 2020-01-26 ENCOUNTER — Other Ambulatory Visit: Payer: Self-pay | Admitting: Family Medicine

## 2020-01-26 DIAGNOSIS — Z1231 Encounter for screening mammogram for malignant neoplasm of breast: Secondary | ICD-10-CM

## 2020-02-28 ENCOUNTER — Other Ambulatory Visit: Payer: Self-pay

## 2020-02-28 ENCOUNTER — Ambulatory Visit (INDEPENDENT_AMBULATORY_CARE_PROVIDER_SITE_OTHER): Payer: Medicare HMO | Admitting: Internal Medicine

## 2020-02-28 ENCOUNTER — Encounter: Payer: Self-pay | Admitting: Internal Medicine

## 2020-02-28 VITALS — BP 152/96 | HR 96 | Temp 98.9°F | Resp 16 | Ht <= 58 in | Wt 169.0 lb

## 2020-02-28 DIAGNOSIS — D508 Other iron deficiency anemias: Secondary | ICD-10-CM | POA: Diagnosis not present

## 2020-02-28 DIAGNOSIS — I10 Essential (primary) hypertension: Secondary | ICD-10-CM | POA: Diagnosis not present

## 2020-02-28 DIAGNOSIS — Z6841 Body Mass Index (BMI) 40.0 and over, adult: Secondary | ICD-10-CM | POA: Diagnosis not present

## 2020-02-28 DIAGNOSIS — Z23 Encounter for immunization: Secondary | ICD-10-CM

## 2020-02-28 DIAGNOSIS — E118 Type 2 diabetes mellitus with unspecified complications: Secondary | ICD-10-CM

## 2020-02-28 DIAGNOSIS — G4700 Insomnia, unspecified: Secondary | ICD-10-CM | POA: Diagnosis not present

## 2020-02-28 DIAGNOSIS — E785 Hyperlipidemia, unspecified: Secondary | ICD-10-CM

## 2020-02-28 DIAGNOSIS — Z0000 Encounter for general adult medical examination without abnormal findings: Secondary | ICD-10-CM

## 2020-02-28 DIAGNOSIS — E559 Vitamin D deficiency, unspecified: Secondary | ICD-10-CM

## 2020-02-28 MED ORDER — BELSOMRA 10 MG PO TABS: 1.0000 | ORAL_TABLET | Freq: Every evening | ORAL | 1 refills | Status: DC | PRN

## 2020-02-28 NOTE — Patient Instructions (Signed)
Health Maintenance, Female Adopting a healthy lifestyle and getting preventive care are important in promoting health and wellness. Ask your health care provider about:  The right schedule for you to have regular tests and exams.  Things you can do on your own to prevent diseases and keep yourself healthy. What should I know about diet, weight, and exercise? Eat a healthy diet   Eat a diet that includes plenty of vegetables, fruits, low-fat dairy products, and lean protein.  Do not eat a lot of foods that are high in solid fats, added sugars, or sodium. Maintain a healthy weight Body mass index (BMI) is used to identify weight problems. It estimates body fat based on height and weight. Your health care provider can help determine your BMI and help you achieve or maintain a healthy weight. Get regular exercise Get regular exercise. This is one of the most important things you can do for your health. Most adults should:  Exercise for at least 150 minutes each week. The exercise should increase your heart rate and make you sweat (moderate-intensity exercise).  Do strengthening exercises at least twice a week. This is in addition to the moderate-intensity exercise.  Spend less time sitting. Even light physical activity can be beneficial. Watch cholesterol and blood lipids Have your blood tested for lipids and cholesterol at 84 years of age, then have this test every 5 years. Have your cholesterol levels checked more often if:  Your lipid or cholesterol levels are high.  You are older than 84 years of age.  You are at high risk for heart disease. What should I know about cancer screening? Depending on your health history and family history, you may need to have cancer screening at various ages. This may include screening for:  Breast cancer.  Cervical cancer.  Colorectal cancer.  Skin cancer.  Lung cancer. What should I know about heart disease, diabetes, and high blood  pressure? Blood pressure and heart disease  High blood pressure causes heart disease and increases the risk of stroke. This is more likely to develop in people who have high blood pressure readings, are of African descent, or are overweight.  Have your blood pressure checked: ? Every 3-5 years if you are 18-39 years of age. ? Every year if you are 40 years old or older. Diabetes Have regular diabetes screenings. This checks your fasting blood sugar level. Have the screening done:  Once every three years after age 40 if you are at a normal weight and have a low risk for diabetes.  More often and at a younger age if you are overweight or have a high risk for diabetes. What should I know about preventing infection? Hepatitis B If you have a higher risk for hepatitis B, you should be screened for this virus. Talk with your health care provider to find out if you are at risk for hepatitis B infection. Hepatitis C Testing is recommended for:  Everyone born from 1945 through 1965.  Anyone with known risk factors for hepatitis C. Sexually transmitted infections (STIs)  Get screened for STIs, including gonorrhea and chlamydia, if: ? You are sexually active and are younger than 84 years of age. ? You are older than 84 years of age and your health care provider tells you that you are at risk for this type of infection. ? Your sexual activity has changed since you were last screened, and you are at increased risk for chlamydia or gonorrhea. Ask your health care provider if   you are at risk.  Ask your health care provider about whether you are at high risk for HIV. Your health care provider may recommend a prescription medicine to help prevent HIV infection. If you choose to take medicine to prevent HIV, you should first get tested for HIV. You should then be tested every 3 months for as long as you are taking the medicine. Pregnancy  If you are about to stop having your period (premenopausal) and  you may become pregnant, seek counseling before you get pregnant.  Take 400 to 800 micrograms (mcg) of folic acid every day if you become pregnant.  Ask for birth control (contraception) if you want to prevent pregnancy. Osteoporosis and menopause Osteoporosis is a disease in which the bones lose minerals and strength with aging. This can result in bone fractures. If you are 65 years old or older, or if you are at risk for osteoporosis and fractures, ask your health care provider if you should:  Be screened for bone loss.  Take a calcium or vitamin D supplement to lower your risk of fractures.  Be given hormone replacement therapy (HRT) to treat symptoms of menopause. Follow these instructions at home: Lifestyle  Do not use any products that contain nicotine or tobacco, such as cigarettes, e-cigarettes, and chewing tobacco. If you need help quitting, ask your health care provider.  Do not use street drugs.  Do not share needles.  Ask your health care provider for help if you need support or information about quitting drugs. Alcohol use  Do not drink alcohol if: ? Your health care provider tells you not to drink. ? You are pregnant, may be pregnant, or are planning to become pregnant.  If you drink alcohol: ? Limit how much you use to 0-1 drink a day. ? Limit intake if you are breastfeeding.  Be aware of how much alcohol is in your drink. In the [place] one drink equals one 12 oz bottle of beer (355 mL), one 5 oz glass of wine (148 mL), or one 1 oz glass of hard liquor (44 mL). General instructions  Schedule regular health, dental, and eye exams.  Stay current with your vaccines.  Tell your health care provider if: ? You often feel depressed. ? You have ever been abused or do not feel safe at home. Summary  Adopting a healthy lifestyle and getting preventive care are important in promoting health and wellness.  Follow your health care provider's instructions about healthy  diet, exercising, and getting tested or screened for diseases.  Follow your health care provider's instructions on monitoring your cholesterol and blood pressure. This information is not intended to replace advice given to you by your health care provider. Make sure you discuss any questions you have with your health care provider. Document Revised: 06/08/2018 Document Reviewed: 06/08/2018 [employer]vier Patient Education  2020 [employer]  

## 2020-02-28 NOTE — Progress Notes (Signed)
Subjective:  Patient ID: Brittany [name], female    DOB: 08-17-1928  Age: 86 y.o. MRN: [id number]  CC: Hypertension, Annual Exam, Diabetes, and Hyperlipidemia  This visit occurred during the SARS-CoV-2 public health emergency.  Safety protocols were in place, including screening questions prior to the visit, additional usage of staff PPE, and extensive cleaning of exam room while observing appropriate contact time as indicated for disinfecting solutions.    HPI ASHAUNTI TREPTOW presents for a CPX.  She reports that her blood pressure has been well controlled and she is compliant with the ARB.  She denies headache, blurred vision, chest pain, shortness of breath, palpitations, edema, or fatigue.  She complains of chronic, persistent insomnia.  She tells me she wakes up frequently and feels afraid.  She would like to take something to help her sleep.  Outpatient Medications Prior to Visit  Medication Sig Dispense Refill  . cloNIDine (CATAPRES - DOSED IN MG/24 HR) 0.1 mg/24hr patch APPLY 1 PATCH(0.1 MG) TOPICALLY TO THE SKIN 1 TIME A WEEK 13 patch 0  . valsartan (DIOVAN) 160 MG tablet Take 1 tablet (160 mg total) by mouth daily. 90 tablet 1   No facility-administered medications prior to visit.    ROS Review of Systems  Constitutional: Negative.  Negative for diaphoresis and fatigue.  HENT: Negative.   Eyes: Negative.   Respiratory: Negative for cough, chest tightness, shortness of breath and wheezing.   Cardiovascular: Negative for chest pain, palpitations and leg swelling.  Gastrointestinal: Negative for abdominal pain, constipation, diarrhea, nausea and vomiting.  Endocrine: Negative.   Genitourinary: Negative.  Negative for difficulty urinating and dysuria.  Musculoskeletal: Negative for arthralgias and myalgias.  Skin: Negative.  Negative for color change and pallor.  Neurological: Negative.  Negative for dizziness, weakness, light-headedness, numbness and headaches.   Hematological: Negative for adenopathy. Does not bruise/bleed easily.  Psychiatric/Behavioral: Positive for sleep disturbance. Negative for confusion, decreased concentration, dysphoric mood, self-injury and suicidal ideas. The patient is nervous/anxious.     Objective:  BP (!) 152/96   Pulse 96   Temp 98.9 F (37.2 C) (Oral)   Resp 16   Ht 4\' 6"  (1.372 m)   Wt 169 lb (76.7 kg)   SpO2 97%   BMI 40.75 kg/m   BP Readings from Last 3 Encounters:  02/28/20 (!) 152/96  11/01/19 (!) 164/102  09/20/19 (!) 176/102    Wt Readings from Last 3 Encounters:  02/28/20 169 lb (76.7 kg)  11/01/19 177 lb (80.3 kg)  09/20/19 180 lb (81.6 kg)    Physical Exam Vitals reviewed.  Constitutional:      Appearance: Normal appearance.  HENT:     Nose: Nose normal.     Mouth/Throat:     Mouth: Mucous membranes are moist.  Eyes:     General: No scleral icterus.    Conjunctiva/sclera: Conjunctivae normal.  Cardiovascular:     Rate and Rhythm: Normal rate and regular rhythm.     Heart sounds: No murmur heard.   Pulmonary:     Effort: Pulmonary effort is normal.     Breath sounds: No stridor. No wheezing, rhonchi or rales.  Abdominal:     General: Abdomen is protuberant. Bowel sounds are normal. There is no distension.     Palpations: Abdomen is soft. There is no hepatomegaly, splenomegaly or mass.     Tenderness: There is no abdominal tenderness.  Musculoskeletal:        General: Normal range of motion.  Cervical back: Neck supple.     Right lower leg: No edema.     Left lower leg: No edema.  Lymphadenopathy:     Cervical: No cervical adenopathy.  Skin:    General: Skin is warm and dry.     Coloration: Skin is not pale.  Neurological:     General: No focal deficit present.     Mental Status: She is alert.  Psychiatric:        Mood and Affect: Mood normal.     Lab Results  Component Value Date   WBC 4.5 01/23/2019   HGB 13.7 01/23/2019   HCT 42.1 01/23/2019   PLT 345.0  01/23/2019   GLUCOSE 105 (H) 09/20/2019   CHOL 206 (H) 01/23/2019   TRIG 130.0 01/23/2019   HDL 56.40 01/23/2019   LDLDIRECT 119.8 04/02/2008   LDLCALC 123 (H) 01/23/2019   ALT 14 02/22/2018   AST 19 02/22/2018   NA 139 09/20/2019   K 3.9 09/20/2019   CL 104 09/20/2019   CREATININE 0.95 09/20/2019   BUN 9 09/20/2019   CO2 26 09/20/2019   TSH 2.85 01/23/2019   HGBA1C 6.0 09/20/2019   MICROALBUR <0.7 01/23/2019    MM 3D SCREEN BREAST BILATERAL  Result Date: 03/08/2019 CLINICAL DATA:  Screening. EXAM: DIGITAL SCREENING BILATERAL MAMMOGRAM WITH TOMO AND CAD COMPARISON:  Previous exam(s). ACR Breast Density Category a: The breast tissue is almost entirely fatty. FINDINGS: There are no findings suspicious for malignancy. Images were processed with CAD. IMPRESSION: No mammographic evidence of malignancy. A result letter of this screening mammogram will be mailed directly to the patient. RECOMMENDATION: Screening mammogram in one year. (Code:SM-B-01Y) BI-RADS CATEGORY  1: Negative. Electronically Signed   By: Lajean Manes M.D.   On: 03/08/2019 13:14    Assessment & Plan:   Fraya was seen today for hypertension, annual exam, diabetes and hyperlipidemia.  Diagnoses and all orders for this visit:  Essential hypertension, benign- Her blood pressure is adequately well controlled.  will continue the current dose of the ARB. -     TSH; Future -     Urinalysis, Routine w reflex microscopic; Future  Type II diabetes mellitus with manifestations ([place])- Her blood sugars are very well controlled. -     BASIC METABOLIC PANEL WITH GFR; Future -     Hemoglobin A1c; Future -     Microalbumin / creatinine urine ratio; Future  Iron deficiency anemia secondary to inadequate dietary iron intake- Her H&H are normal now. -     CBC with Differential/Platelet; Future  Hyperlipidemia with target LDL less than 100- Statin therapy is not indicated. -     Lipid panel; Future -     Hepatic function panel;  Future  Vitamin D deficiency- Her vitamin D level is normal now. -     VITAMIN D 25 Hydroxy (Vit-D Deficiency, Fractures); Future  Middle insomnia -     Suvorexant (BELSOMRA) 10 MG TABS; Take 1 tablet by mouth at bedtime as needed.  Routine general medical examination at a health care facility- Exam completed, labs reviewed, vaccines reviewed and updated, no cancer screenings are indicated, patient education was given.  Other orders -     Flu Vaccine QUAD 6+ mos PF IM (Fluarix Quad PF)   I am having Brittany [name] start on Belsomra. I am also having her maintain her valsartan and cloNIDine.  Meds ordered this encounter  Medications  . Suvorexant (BELSOMRA) 10 MG TABS    Sig:  Take 1 tablet by mouth at bedtime as needed.    Dispense:  90 tablet    Refill:  1     Follow-up: Return in about 6 months (around 08/27/2020).  Scarlette Calico, MD

## 2020-03-08 ENCOUNTER — Ambulatory Visit
Admission: RE | Admit: 2020-03-08 | Discharge: 2020-03-08 | Disposition: A | Discharge status: Home / Self Care | Payer: Medicare HMO | Source: Ambulatory Visit | Attending: Family Medicine | Admitting: Family Medicine

## 2020-03-08 ENCOUNTER — Other Ambulatory Visit: Payer: Self-pay

## 2020-03-08 DIAGNOSIS — Z1231 Encounter for screening mammogram for malignant neoplasm of breast: Secondary | ICD-10-CM | POA: Diagnosis not present

## 2020-05-02 ENCOUNTER — Other Ambulatory Visit: Payer: Self-pay | Admitting: Internal Medicine

## 2020-05-02 DIAGNOSIS — I10 Essential (primary) hypertension: Secondary | ICD-10-CM

## 2020-06-12 ENCOUNTER — Other Ambulatory Visit: Payer: Self-pay

## 2020-06-12 ENCOUNTER — Ambulatory Visit (INDEPENDENT_AMBULATORY_CARE_PROVIDER_SITE_OTHER): Payer: Medicare HMO | Admitting: Internal Medicine

## 2020-06-12 ENCOUNTER — Encounter: Payer: Self-pay | Admitting: Internal Medicine

## 2020-06-12 VITALS — BP 160/84 | HR 86 | Temp 98.1°F | Ht <= 58 in | Wt 175.0 lb

## 2020-06-12 DIAGNOSIS — L299 Pruritus, unspecified: Secondary | ICD-10-CM

## 2020-06-12 DIAGNOSIS — I10 Essential (primary) hypertension: Secondary | ICD-10-CM

## 2020-06-12 MED ORDER — TELMISARTAN 40 MG PO TABS: 40.0000 mg | ORAL_TABLET | Freq: Every day | ORAL | 0 refills | Status: DC

## 2020-06-12 NOTE — Patient Instructions (Addendum)
   Medications changes include :  Stop the valsartan.  Start telmisartan 40 mg daily.     Your prescription(s) have been submitted to your pharmacy. Please take as directed and contact our office if you believe you are having problem(s) with the medication(s).     Please followup with Dr Ronnald Ramp

## 2020-06-12 NOTE — Progress Notes (Signed)
Subjective:    Patient ID: Brittany [name], female    DOB: 07/10/1928, 86 y.o.   MRN: [id number]  HPI The patient is here for an acute visit.  She is itching throughout her body.  She denies any rash.  She states this is a problem since she has had over the past several years intermittently. It has been related to her blood pressure medications. After being on any blood pressure medication for a while she develops itching. Once she stops the medication the itching goes away so she knows it is the medication. She denies any change in products or anything else that could be the cause.  She has been on her valsartan for 3-4 months.  She has itching every day, worse at night.  She has been itching for weeks, but it is getting worse.  If she does not take her medication one day the itching goes away. She knows she can stay off the medication and is here to have a change in her medications..     Medications and allergies reviewed with patient and updated if appropriate.  Patient Active Problem List   Diagnosis Date Noted  . Urinary frequency 01/23/2019  . Vitamin D deficiency 04/15/2016  . Anemia, iron deficiency 04/15/2016  . Middle insomnia 04/15/2016  . Routine general medical examination at a health care facility 12/25/2013  . Allergic rhinitis, cause unspecified 04/15/2011  . Type II diabetes mellitus with manifestations ([place]) 05/05/2007  . Hyperlipidemia with target LDL less than 100 05/05/2007  . Essential hypertension, benign 05/05/2007  . OSTEOPOROSIS 05/05/2007    Current Outpatient Medications on File Prior to Visit  Medication Sig Dispense Refill  . cloNIDine (CATAPRES - DOSED IN MG/24 HR) 0.1 mg/24hr patch APPLY 1 PATCH(0.1 MG) TOPICALLY TO THE SKIN 1 TIME A WEEK 13 patch 0  . Suvorexant (BELSOMRA) 10 MG TABS Take 1 tablet by mouth at bedtime as needed. 90 tablet 1  . valsartan (DIOVAN) 160 MG tablet TAKE 1 TABLET(160 MG) BY MOUTH DAILY 90 tablet 1   No current  facility-administered medications on file prior to visit.    Past Medical History:  Diagnosis Date  . Cancer ([place])    right breast  . Diabetes mellitus    type 2. 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 Diverticulosis of colon   . DJD (degenerative joint disease) of knee    Left  . Hyperlipidemia   . Hypertension   . Osteoporosis   . Personal history of radiation therapy   . Positive PPD    history off    Past Surgical History:  Procedure Laterality Date  . BREAST LUMPECTOMY Right 2010  . BREAST SURGERY  2010   + RT    Social History   Socioeconomic History  . Marital status: Widowed    Spouse name: Not on file  . Number of children: 4  . Years of education: Not on file  . Highest education level: Not on file  Occupational History  . 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 Smoking status: Never Smoker  . Smokeless tobacco: Never Used  Vaping Use  . Vaping Use: Never used  Substance and Sexual Activity  . Alcohol use: No  . Drug use: No  . Sexual activity: Never  Other Topics Concern  . 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 Heart disease Mother   . Diabetes Father   . Diabetes Other        1st degree relative  . Hypertension Other   . Stroke Other   . Cancer Other        cervical  . Lupus Other   . Coronary artery disease Other     Review of Systems  Constitutional: Negative for fever.  Respiratory: Negative for cough, shortness of breath and wheezing.   Cardiovascular: Negative for chest pain, palpitations and leg swelling.  Skin: Negative for rash.       Itching all  over  Neurological: Negative for light-headedness and headaches.       Objective:   Vitals:   06/12/20 0932  BP: (!) 160/84  Pulse: 86  Temp: 98.1 F (36.7 C)  SpO2: 98%   BP Readings from Last 3 Encounters:  06/12/20 (!) 160/84  02/28/20 (!) 152/96  11/01/19 (!) 164/102   Wt Readings from Last 3 Encounters:  06/12/20 175 lb (79.4 kg)  02/28/20 169 lb (76.7 kg)  11/01/19 177 lb (80.3 kg)   Body mass index is 42.19 kg/m.   Physical Exam    Constitutional: Appears well-developed and well-nourished. No distress.  Head: Normocephalic and atraumatic.  Neck: Neck supple. No tracheal deviation present. No thyromegaly present.  No cervical lymphadenopathy Cardiovascular: Normal rate, regular rhythm and normal heart sounds.  No murmur heard. No carotid bruit .  No edema Pulmonary/Chest: Effort normal and breath sounds normal. No respiratory distress. No has no wheezes. No rales.  Skin: Skin is warm and dry. Not diaphoretic.  Psychiatric: Normal mood and affect. Behavior is normal.       Assessment & Plan:    Pruritus: Acute She states that this is related to her blood pressure medication and has had this in the past with several blood pressure medications She denies any new products or any other possible cause At this time I will go ahead and change her blood pressure medication, but I am not convinced that is the cause since she has been on several different medications in different families Discussed that there could possibly be another cause and she may want to consider seeing an allergist  Hypertension: Chronic Blood pressure high here today She states it is usually controlled with her current medication We will discontinue that medication given the pruritus from it Start telmisartan 40 mg daily Advised her to follow-up with her PCP to make sure this is controlling her blood pressure     This visit occurred during the SARS-CoV-2 public health emergency.   Safety protocols were in place, including screening questions prior to the visit, additional usage of staff PPE, and extensive cleaning of exam room while observing appropriate contact time as indicated for disinfecting solutions.

## 2020-09-05 ENCOUNTER — Ambulatory Visit (INDEPENDENT_AMBULATORY_CARE_PROVIDER_SITE_OTHER): Payer: Medicare HMO | Admitting: Internal Medicine

## 2020-09-05 ENCOUNTER — Other Ambulatory Visit: Payer: Self-pay

## 2020-09-05 ENCOUNTER — Encounter: Payer: Self-pay | Admitting: Internal Medicine

## 2020-09-05 VITALS — BP 148/86 | HR 96 | Temp 98.0°F | Resp 16 | Ht <= 58 in | Wt 178.0 lb

## 2020-09-05 DIAGNOSIS — I10 Essential (primary) hypertension: Secondary | ICD-10-CM

## 2020-09-05 DIAGNOSIS — N1832 Chronic kidney disease, stage 3b: Secondary | ICD-10-CM

## 2020-09-05 DIAGNOSIS — E118 Type 2 diabetes mellitus with unspecified complications: Secondary | ICD-10-CM

## 2020-09-05 DIAGNOSIS — E1122 Type 2 diabetes mellitus with diabetic chronic kidney disease: Secondary | ICD-10-CM | POA: Diagnosis not present

## 2020-09-05 DIAGNOSIS — Z6841 Body Mass Index (BMI) 40.0 and over, adult: Secondary | ICD-10-CM | POA: Diagnosis not present

## 2020-09-05 LAB — CBC WITH DIFFERENTIAL/PLATELET
Basophils Absolute: 0 10*3/uL (ref 0.0–0.1)
Basophils Relative: 0.5 % (ref 0.0–3.0)
Eosinophils Absolute: 0.2 10*3/uL (ref 0.0–0.7)
Eosinophils Relative: 3.1 % (ref 0.0–5.0)
HCT: 40.6 % (ref 36.0–46.0)
Hemoglobin: 13.4 g/dL (ref 12.0–15.0)
Lymphocytes Relative: 33.9 % (ref 12.0–46.0)
Lymphs Abs: 1.8 10*3/uL (ref 0.7–4.0)
MCHC: 33 g/dL (ref 30.0–36.0)
MCV: 86.6 fl (ref 78.0–100.0)
Monocytes Absolute: 0.5 10*3/uL (ref 0.1–1.0)
Monocytes Relative: 8.7 % (ref 3.0–12.0)
Neutro Abs: 2.9 10*3/uL (ref 1.4–7.7)
Neutrophils Relative %: 53.8 % (ref 43.0–77.0)
Platelets: 399 10*3/uL (ref 150.0–400.0)
RBC: 4.68 Mil/uL (ref 3.87–5.11)
RDW: 14.3 % (ref 11.5–15.5)
WBC: 5.4 10*3/uL (ref 4.0–10.5)

## 2020-09-05 LAB — HEMOGLOBIN A1C: Hgb A1c MFr Bld: 6.2 % (ref 4.6–6.5)

## 2020-09-05 LAB — BASIC METABOLIC PANEL
BUN: 15 mg/dL (ref 6–23)
CO2: 26 mEq/L (ref 19–32)
Calcium: 9.4 mg/dL (ref 8.4–10.5)
Chloride: 107 mEq/L (ref 96–112)
Creatinine, Ser: 0.98 mg/dL (ref 0.40–1.20)
GFR: 51.04 mL/min — ABNORMAL LOW (ref >60.00)
Glucose, Bld: 105 mg/dL — ABNORMAL HIGH (ref 70–99)
Potassium: 3.8 mEq/L (ref 3.5–5.1)
Sodium: 141 mEq/L (ref 135–145)

## 2020-09-05 LAB — HEPATIC FUNCTION PANEL
ALT: 14 U/L (ref 0–35)
AST: 19 U/L (ref 0–37)
Albumin: 4 g/dL (ref 3.5–5.2)
Alkaline Phosphatase: 107 U/L (ref 39–117)
Bilirubin, Direct: 0.1 mg/dL (ref 0.0–0.3)
Total Bilirubin: 0.2 mg/dL (ref 0.2–1.2)
Total Protein: 7.6 g/dL (ref 6.0–8.3)

## 2020-09-05 NOTE — Patient Instructions (Signed)

## 2020-09-05 NOTE — Progress Notes (Unsigned)
Subjective:  Patient ID: Brittany [name], female    DOB: April 02, 1929  Age: 87 y.o. MRN: [id number]  CC: Hypertension and Hyperlipidemia  This visit occurred during the SARS-CoV-2 public health emergency.  Safety protocols were in place, including screening questions prior to the visit, additional usage of staff PPE, and extensive cleaning of exam room while observing appropriate contact time as indicated for disinfecting solutions.    HPI NANCYANN COTTERMAN presents for f/up - She has felt well recently - BP has been well controlled.  Outpatient Medications Prior to Visit  Medication Sig Dispense Refill  . cloNIDine (CATAPRES - DOSED IN MG/24 HR) 0.1 mg/24hr patch APPLY 1 PATCH(0.1 MG) TOPICALLY TO THE SKIN 1 TIME A WEEK 13 patch 0  . Suvorexant (BELSOMRA) 10 MG TABS Take 1 tablet by mouth at bedtime as needed. 90 tablet 1  . telmisartan (MICARDIS) 40 MG tablet Take 1 tablet (40 mg total) by mouth daily. 90 tablet 0   No facility-administered medications prior to visit.    ROS Review of Systems  Constitutional: Negative for chills, diaphoresis and fatigue.  HENT: Negative.   Eyes: Negative for visual disturbance.  Respiratory: Negative for chest tightness, shortness of breath and wheezing.   Cardiovascular: Negative for chest pain, palpitations and leg swelling.  Gastrointestinal: Negative for abdominal pain, diarrhea, nausea and vomiting.  Endocrine: Negative.   Genitourinary: Negative.  Negative for difficulty urinating.  Musculoskeletal: Negative.  Negative for arthralgias and myalgias.  Skin: Negative.  Negative for color change.  Neurological: Negative.  Negative for dizziness, weakness, light-headedness and headaches.  Hematological: Negative for adenopathy. Does not bruise/bleed easily.  Psychiatric/Behavioral: Negative.     Objective:  BP (!) 148/86   Pulse 96   Temp 98 F (36.7 C) (Oral)   Resp 16   Ht 4\' 6"  (1.372 m)   Wt 178 lb (80.7 kg)   SpO2 99%   BMI 42.92  kg/m   BP Readings from Last 3 Encounters:  09/05/20 (!) 148/86  06/12/20 (!) 160/84  02/28/20 (!) 152/96    Wt Readings from Last 3 Encounters:  09/05/20 178 lb (80.7 kg)  06/12/20 175 lb (79.4 kg)  02/28/20 169 lb (76.7 kg)    Physical Exam Vitals reviewed.  HENT:     Nose: Nose normal.     Mouth/Throat:     Mouth: Mucous membranes are moist.  Eyes:     General: No scleral icterus.    Conjunctiva/sclera: Conjunctivae normal.  Cardiovascular:     Rate and Rhythm: Normal rate and regular rhythm.     Heart sounds: No murmur heard.   Pulmonary:     Effort: Pulmonary effort is normal.     Breath sounds: No stridor. No wheezing, rhonchi or rales.  Abdominal:     General: Abdomen is protuberant. Bowel sounds are normal. There is no distension.     Palpations: Abdomen is soft. There is no hepatomegaly, splenomegaly or mass.  Musculoskeletal:        General: Normal range of motion.     Cervical back: Neck supple.     Right lower leg: No edema.     Left lower leg: No edema.  Lymphadenopathy:     Cervical: No cervical adenopathy.  Skin:    General: Skin is warm and dry.  Neurological:     General: No focal deficit present.     Mental Status: She is alert.     Lab Results  Component Value Date  WBC 5.4 09/05/2020   HGB 13.4 09/05/2020   HCT 40.6 09/05/2020   PLT 399.0 09/05/2020   GLUCOSE 105 (H) 09/05/2020   CHOL 206 (H) 01/23/2019   TRIG 130.0 01/23/2019   HDL 56.40 01/23/2019   LDLDIRECT 119.8 04/02/2008   LDLCALC 123 (H) 01/23/2019   ALT 14 09/05/2020   AST 19 09/05/2020   NA 141 09/05/2020   K 3.8 09/05/2020   CL 107 09/05/2020   CREATININE 0.98 09/05/2020   BUN 15 09/05/2020   CO2 26 09/05/2020   TSH 2.85 01/23/2019   HGBA1C 6.2 09/05/2020   MICROALBUR <0.7 01/23/2019    MM 3D SCREEN BREAST BILATERAL  Result Date: 03/11/2020 CLINICAL DATA:  Screening. EXAM: DIGITAL SCREENING BILATERAL MAMMOGRAM WITH TOMO AND CAD COMPARISON:  Previous exam(s).  ACR Breast Density Category a: The breast tissue is almost entirely fatty. FINDINGS: There are no findings suspicious for malignancy. Images were processed with CAD. IMPRESSION: No mammographic evidence of malignancy. A result letter of this screening mammogram will be mailed directly to the patient. RECOMMENDATION: Screening mammogram in one year. (Code:SM-B-01Y) BI-RADS CATEGORY  1: Negative. Electronically Signed   By: Ammie Ferrier M.D.   On: 03/11/2020 11:28    Assessment & Plan:   Marella was seen today for hypertension and hyperlipidemia.  Diagnoses and all orders for this visit:  Essential hypertension, benign- Her BP is adequately well controlled. -     Basic metabolic panel; Future -     Hepatic function panel; Future -     CBC with Differential/Platelet; Future -     CBC with Differential/Platelet -     Hepatic function panel -     Basic metabolic panel  Type II diabetes mellitus with manifestations ([place])- Her blood sugar is well controlled. -     Hemoglobin A1c; Future -     Hepatic function panel; Future -     Hepatic function panel -     Hemoglobin A1c  Stage 3b chronic kidney disease ([place])- Her renal fxn is stable. She will avoid nephrotoxic agents.   I am having Brittany [name] maintain her cloNIDine, Belsomra, and telmisartan.  No orders of the defined types were placed in this encounter.    Follow-up: Return in about 6 months (around 03/08/2021).  Scarlette Calico, MD

## 2020-09-06 ENCOUNTER — Encounter: Payer: Self-pay | Admitting: Internal Medicine

## 2020-09-06 DIAGNOSIS — N1832 Chronic kidney disease, stage 3b: Secondary | ICD-10-CM | POA: Insufficient documentation

## 2020-09-09 ENCOUNTER — Other Ambulatory Visit: Payer: Self-pay | Admitting: Internal Medicine

## 2020-09-09 NOTE — Telephone Encounter (Signed)
Patient is completely out of medication and is requesting a refill

## 2020-12-18 ENCOUNTER — Other Ambulatory Visit: Payer: Self-pay

## 2020-12-18 ENCOUNTER — Encounter: Payer: Self-pay | Admitting: Internal Medicine

## 2020-12-18 ENCOUNTER — Ambulatory Visit (INDEPENDENT_AMBULATORY_CARE_PROVIDER_SITE_OTHER): Payer: Medicare HMO | Admitting: Internal Medicine

## 2020-12-18 VITALS — BP 142/88 | HR 98 | Temp 98.9°F | Resp 16 | Ht <= 58 in | Wt 176.0 lb

## 2020-12-18 DIAGNOSIS — N1832 Chronic kidney disease, stage 3b: Secondary | ICD-10-CM

## 2020-12-18 DIAGNOSIS — I10 Essential (primary) hypertension: Secondary | ICD-10-CM | POA: Diagnosis not present

## 2020-12-18 DIAGNOSIS — E118 Type 2 diabetes mellitus with unspecified complications: Secondary | ICD-10-CM

## 2020-12-18 MED ORDER — TELMISARTAN 40 MG PO TABS: 40.0000 mg | ORAL_TABLET | Freq: Every day | ORAL | 1 refills | Status: DC

## 2020-12-18 NOTE — Patient Instructions (Signed)

## 2020-12-18 NOTE — Progress Notes (Signed)
Subjective:  Patient ID: Brittany [name], female    DOB: 04-04-1929  Age: 87 y.o. MRN: [id number]  CC: Hypertension  This visit occurred during the SARS-CoV-2 public health emergency.  Safety protocols were in place, including screening questions prior to the visit, additional usage of staff PPE, and extensive cleaning of exam room while observing appropriate contact time as indicated for disinfecting solutions.    HPI Brittany [name] presents for f/up -  She tells me her blood pressure has been well controlled.  She is taking telmisartan but she is not taking clonidine.  She denies headache, blurred vision, chest pain, shortness of breath, dizziness, edema, or lightheadedness.  Outpatient Medications Prior to Visit  Medication Sig Dispense Refill   telmisartan (MICARDIS) 40 MG tablet TAKE 1 TABLET(40 MG) BY MOUTH DAILY 90 tablet 1   cloNIDine (CATAPRES - DOSED IN MG/24 HR) 0.1 mg/24hr patch APPLY 1 PATCH(0.1 MG) TOPICALLY TO THE SKIN 1 TIME A WEEK (Patient not taking: Reported on 12/18/2020) 13 patch 0   Suvorexant (BELSOMRA) 10 MG TABS Take 1 tablet by mouth at bedtime as needed. (Patient not taking: Reported on 12/18/2020) 90 tablet 1   No facility-administered medications prior to visit.    ROS Review of Systems  Constitutional:  Negative for diaphoresis, fatigue and unexpected weight change.  HENT: Negative.    Eyes:  Negative for visual disturbance.  Respiratory:  Negative for chest tightness and shortness of breath.   Cardiovascular:  Negative for chest pain, palpitations and leg swelling.  Gastrointestinal:  Negative for abdominal pain, constipation and diarrhea.  Endocrine: Negative.   Genitourinary: Negative.  Negative for difficulty urinating.  Musculoskeletal: Negative.  Negative for arthralgias and myalgias.  Skin: Negative.   Neurological:  Negative for dizziness, weakness, light-headedness and headaches.  Hematological:  Negative for adenopathy. Does not bruise/bleed  easily.  Psychiatric/Behavioral: Negative.     Objective:  BP (!) 142/88 (BP Location: Left Arm, Patient Position: Sitting, Cuff Size: Large)   Pulse 98   Temp 98.9 F (37.2 C) (Oral)   Resp 16   Ht 4\' 6"  (1.372 m)   Wt 176 lb (79.8 kg)   SpO2 98%   BMI 42.44 kg/m   BP Readings from Last 3 Encounters:  12/18/20 (!) 142/88  09/05/20 (!) 148/86  06/12/20 (!) 160/84    Wt Readings from Last 3 Encounters:  12/18/20 176 lb (79.8 kg)  09/05/20 178 lb (80.7 kg)  06/12/20 175 lb (79.4 kg)    Physical Exam Vitals reviewed.  HENT:     Nose: Nose normal.     Mouth/Throat:     Mouth: Mucous membranes are moist.  Eyes:     General: No scleral icterus.    Conjunctiva/sclera: Conjunctivae normal.  Cardiovascular:     Rate and Rhythm: Normal rate and regular rhythm.     Heart sounds: No murmur heard. Pulmonary:     Effort: Pulmonary effort is normal.     Breath sounds: No stridor. No wheezing, rhonchi or rales.  Abdominal:     General: Abdomen is protuberant. Bowel sounds are normal. There is no distension.     Palpations: Abdomen is soft. There is no hepatomegaly, splenomegaly or mass.  Musculoskeletal:        General: Normal range of motion.     Cervical back: Neck supple.     Right lower leg: No edema.     Left lower leg: No edema.  Lymphadenopathy:     Cervical: No cervical  adenopathy.  Skin:    General: Skin is warm and dry.  Neurological:     General: No focal deficit present.     Mental Status: She is alert.  Psychiatric:        Mood and Affect: Mood normal.        Behavior: Behavior normal.    Lab Results  Component Value Date   WBC 5.4 09/05/2020   HGB 13.4 09/05/2020   HCT 40.6 09/05/2020   PLT 399.0 09/05/2020   GLUCOSE 86 12/18/2020   CHOL 206 (H) 01/23/2019   TRIG 130.0 01/23/2019   HDL 56.40 01/23/2019   LDLDIRECT 119.8 04/02/2008   LDLCALC 123 (H) 01/23/2019   ALT 14 09/05/2020   AST 19 09/05/2020   NA 143 12/18/2020   K 4.0 12/18/2020   CL  109 12/18/2020   CREATININE 1.28 (H) 12/18/2020   BUN 19 12/18/2020   CO2 24 12/18/2020   TSH 2.85 01/23/2019   HGBA1C 6.2 12/18/2020   MICROALBUR <0.7 01/23/2019    MM 3D SCREEN BREAST BILATERAL  Result Date: 03/11/2020 CLINICAL DATA:  Screening. EXAM: DIGITAL SCREENING BILATERAL MAMMOGRAM WITH TOMO AND CAD COMPARISON:  Previous exam(s). ACR Breast Density Category a: The breast tissue is almost entirely fatty. FINDINGS: There are no findings suspicious for malignancy. Images were processed with CAD. IMPRESSION: No mammographic evidence of malignancy. A result letter of this screening mammogram will be mailed directly to the patient. RECOMMENDATION: Screening mammogram in one year. (Code:[id number]-01Y) BI-RADS CATEGORY  1: Negative. Electronically Signed   By: Ammie Ferrier M.D.   On: 03/11/2020 11:28    Assessment & Plan:   Brittany [name] was seen today for hypertension.  Diagnoses and all orders for this visit:  Essential hypertension, benign- Her blood pressure is adequately well controlled. -     telmisartan (MICARDIS) 40 MG tablet; Take 1 tablet (40 mg total) by mouth daily. -     Basic metabolic panel; Future -     Basic metabolic panel  Type II diabetes mellitus with manifestations ([place])- Her blood sugar is well controlled. -     telmisartan (MICARDIS) 40 MG tablet; Take 1 tablet (40 mg total) by mouth daily. -     Hemoglobin A1c; Future -     Hemoglobin A1c  Stage 3b chronic kidney disease ([place])- Her blood pressure and blood sugar well controlled.  She is avoiding nephrotoxic agents.  Her renal function is stable. -     Basic metabolic panel; Future -     Basic metabolic panel  I have discontinued Macky Lower. Tegeler's cloNIDine and Belsomra. I have also changed her telmisartan.  Meds ordered this encounter  Medications   telmisartan (MICARDIS) 40 MG tablet    Sig: Take 1 tablet (40 mg total) by mouth daily.    Dispense:  90 tablet    Refill:  1     Follow-up: Return in about 6  months (around 06/19/2021).  Scarlette Calico, MD

## 2020-12-19 LAB — BASIC METABOLIC PANEL
BUN: 19 mg/dL (ref 6–23)
CO2: 24 mEq/L (ref 19–32)
Calcium: 9.9 mg/dL (ref 8.4–10.5)
Chloride: 109 mEq/L (ref 96–112)
Creatinine, Ser: 1.28 mg/dL — ABNORMAL HIGH (ref 0.40–1.20)
GFR: 36.97 mL/min — ABNORMAL LOW (ref >60.00)
Glucose, Bld: 86 mg/dL (ref 70–99)
Potassium: 4 mEq/L (ref 3.5–5.1)
Sodium: 143 mEq/L (ref 135–145)

## 2020-12-19 LAB — HEMOGLOBIN A1C: Hgb A1c MFr Bld: 6.2 % (ref 4.6–6.5)

## 2021-04-21 ENCOUNTER — Ambulatory Visit (INDEPENDENT_AMBULATORY_CARE_PROVIDER_SITE_OTHER): Payer: Medicare HMO | Admitting: Internal Medicine

## 2021-04-21 ENCOUNTER — Other Ambulatory Visit: Payer: Self-pay

## 2021-04-21 ENCOUNTER — Encounter: Payer: Self-pay | Admitting: Internal Medicine

## 2021-04-21 VITALS — BP 134/90 | HR 82 | Resp 18 | Ht <= 58 in | Wt 174.2 lb

## 2021-04-21 DIAGNOSIS — I10 Essential (primary) hypertension: Secondary | ICD-10-CM

## 2021-04-21 DIAGNOSIS — Z23 Encounter for immunization: Secondary | ICD-10-CM

## 2021-04-21 MED ORDER — ATENOLOL 50 MG PO TABS: 50.0000 mg | ORAL_TABLET | Freq: Every day | ORAL | 1 refills | Status: DC

## 2021-04-21 NOTE — Assessment & Plan Note (Signed)
She is complaining of side effects from telmisartan and BP is borderline today at 134/90. Will switch to atenolol 50 mg daily and stop telmisartan. Back in 2-4 weeks for f/u and BP check along with labs.

## 2021-04-21 NOTE — Progress Notes (Signed)
   Subjective:   Patient ID: Brittany [name], female    DOB: 21-Mar-1929, 87 y.o.   MRN: [id number]  HPI The patient is a 87 YO female coming in for GI concerns about her BP regimen. States whenever she takes her telmisartan she gets nausea and quivering in insides.   Review of Systems  Constitutional: Negative.   HENT: Negative.    Eyes: Negative.   Respiratory:  Negative for cough, chest tightness and shortness of breath.   Cardiovascular:  Negative for chest pain, palpitations and leg swelling.  Gastrointestinal:  Positive for nausea. Negative for abdominal distention, abdominal pain, constipation, diarrhea and vomiting.  Musculoskeletal: Negative.   Skin: Negative.   Neurological: Negative.   Psychiatric/Behavioral: Negative.     Objective:  Physical Exam Constitutional:      Appearance: She is well-developed.  HENT:     Head: Normocephalic and atraumatic.  Cardiovascular:     Rate and Rhythm: Normal rate and regular rhythm.  Pulmonary:     Effort: Pulmonary effort is normal. No respiratory distress.     Breath sounds: Normal breath sounds. No wheezing or rales.  Abdominal:     General: Bowel sounds are normal. There is no distension.     Palpations: Abdomen is soft.     Tenderness: There is no abdominal tenderness. There is no rebound.  Musculoskeletal:     Cervical back: Normal range of motion.  Skin:    General: Skin is warm and dry.  Neurological:     Mental Status: She is alert.     Coordination: Coordination normal.    Vitals:   04/21/21 1445  BP: 134/90  Pulse: 82  Resp: 18  SpO2: 98%  Weight: 174 lb 3.2 oz (79 kg)  Height: 4\' 6"  (1.372 m)    This visit occurred during the SARS-CoV-2 public health emergency.  Safety protocols were in place, including screening questions prior to the visit, additional usage of staff PPE, and extensive cleaning of exam room while observing appropriate contact time as indicated for disinfecting solutions.   Assessment &  Plan:  Flu shot given at visit

## 2021-04-21 NOTE — Patient Instructions (Addendum)
We will stop the telmisartan. We have sent in atenolol to replace this and is 1 pill daily. It can be taken in the morning and the evening.  Do not take the telmisartan and the atenolol in the same day.

## 2021-07-15 IMAGING — MG DIGITAL SCREENING BILAT W/ TOMO W/ CAD
6 of 12 series · 6 of 36 positions shown · non-contrast
Comparison: Previous exam(s).

ACR Breast Density Category a: The breast tissue is almost entirely
fatty.

CLINICAL DATA: Screening.

EXAM:
DIGITAL SCREENING BILATERAL MAMMOGRAM WITH TOMO AND CAD

[R CC synth-2D]
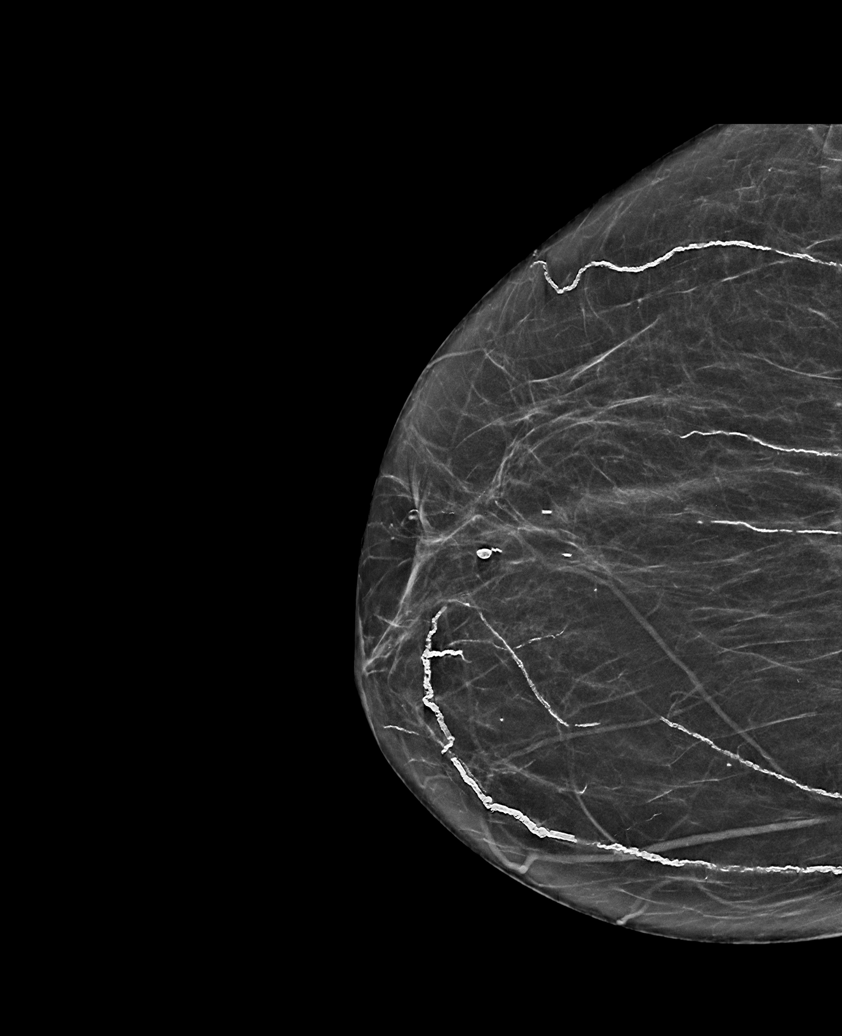

[R MLO synth-2D (1 of 2)]
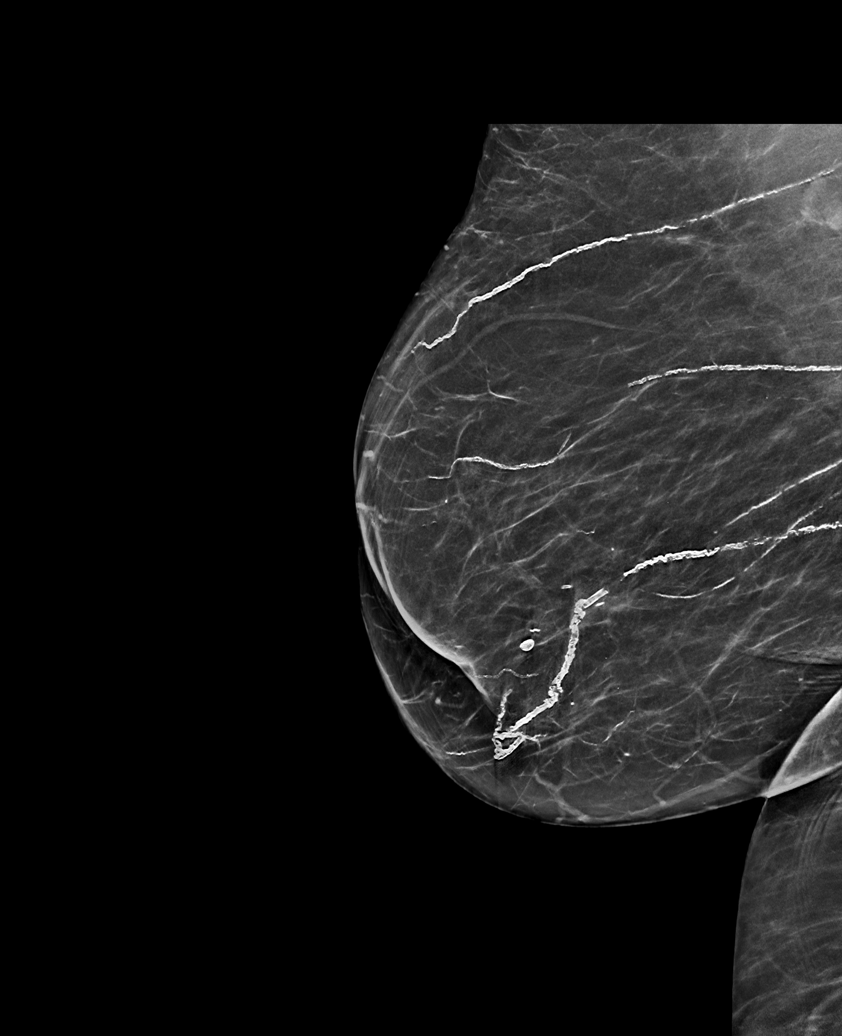

[L CC synth-2D (1 of 2)]
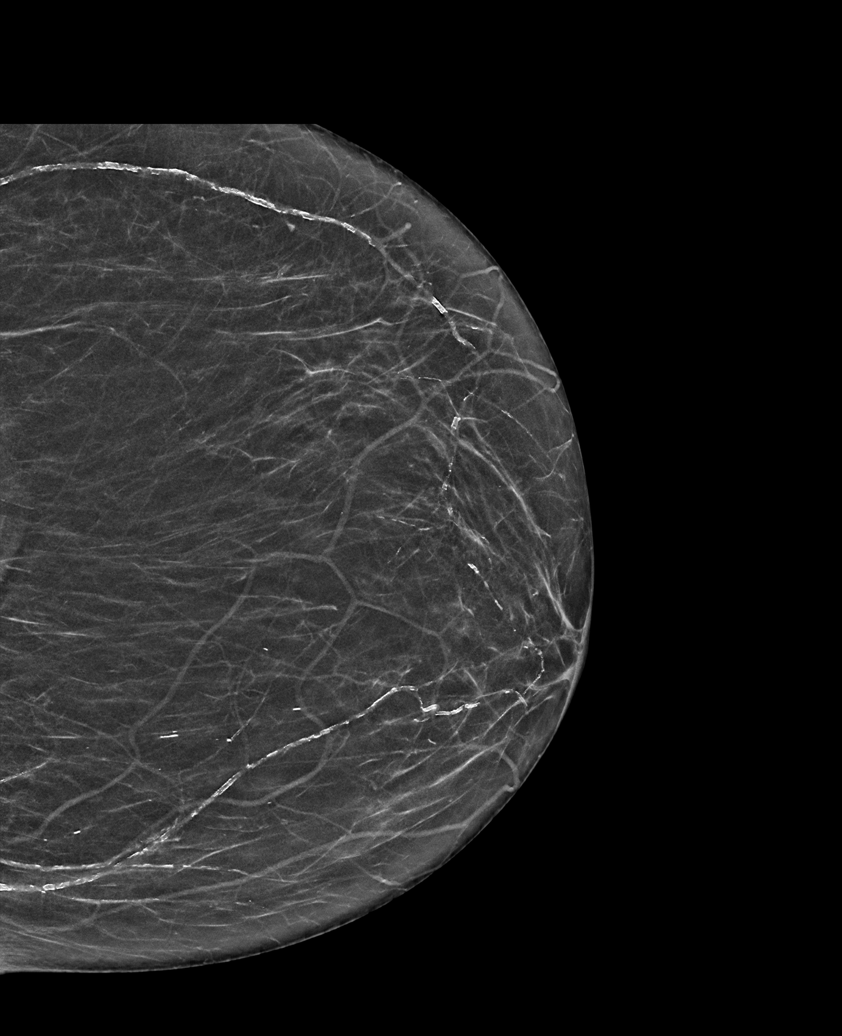

[L MLO synth-2D]
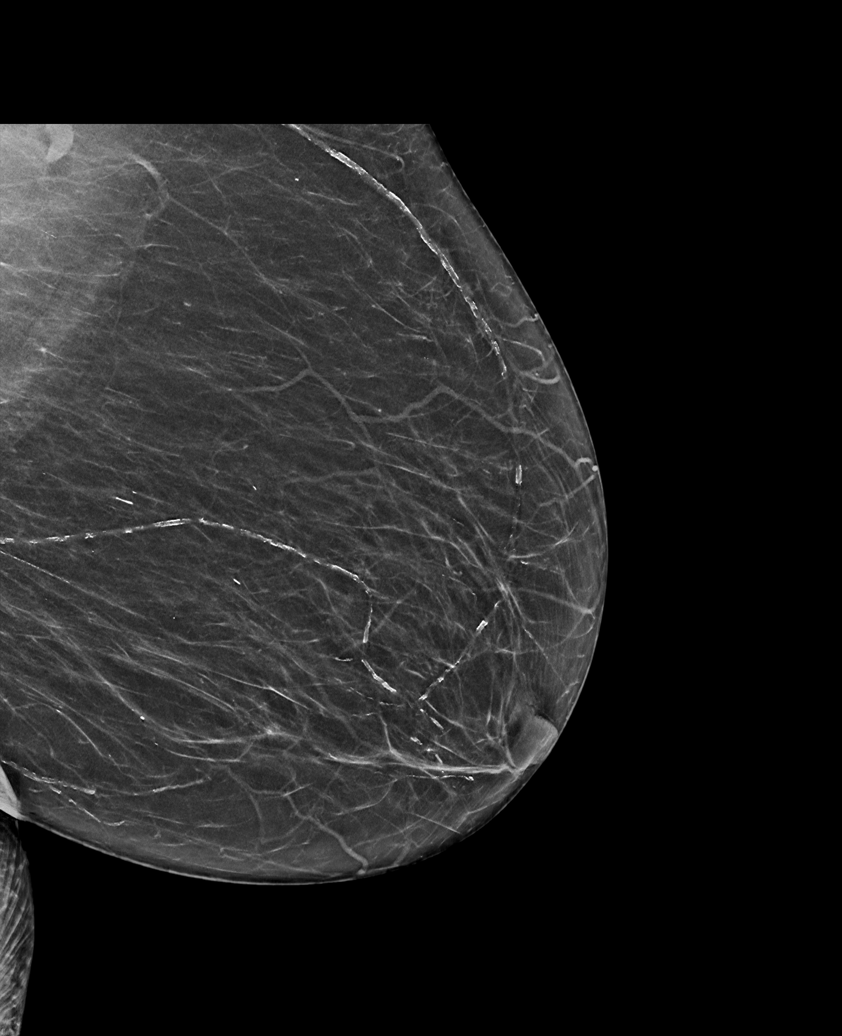

[R MLO synth-2D (2 of 2)]
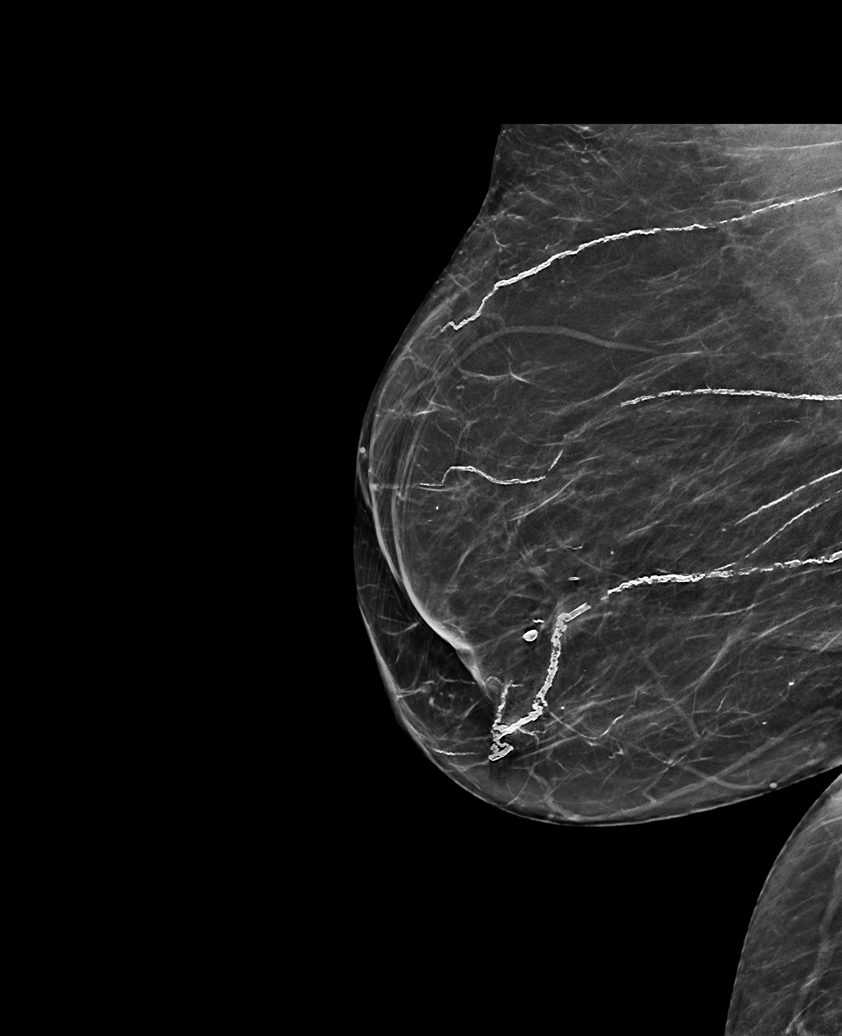

[L CC synth-2D (2 of 2)]
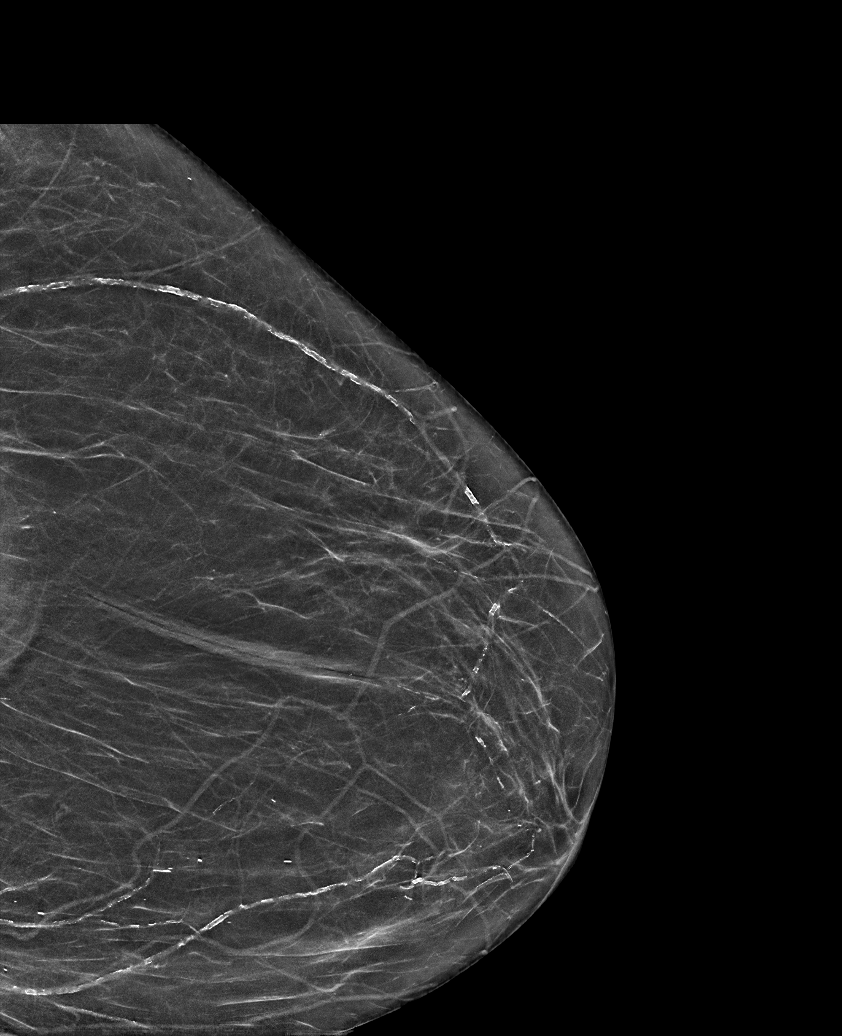

[6 of 36 positions shown; findings below may reference images not displayed]

FINDINGS: There are no findings suspicious for malignancy. Images were
processed with CAD.
IMPRESSION: No mammographic evidence of malignancy. A result letter of this
screening mammogram will be mailed directly to the patient.

RECOMMENDATION:
Screening mammogram in one year. (Code:[id number])

BI-RADS CATEGORY  1: Negative.

## 2021-12-17 ENCOUNTER — Telehealth: Payer: Self-pay

## 2021-12-17 ENCOUNTER — Ambulatory Visit: Payer: Medicare HMO

## 2021-12-17 NOTE — Telephone Encounter (Signed)
Patient scheduled for AWV-S via phone; left v-message to return call to [phone].

## 2021-12-17 NOTE — Telephone Encounter (Signed)
Spoke with son; he will relay message to patient. Cancelling AWV; Advised patient's son that she can call office to reschedule.

## 2022-03-20 ENCOUNTER — Ambulatory Visit (INDEPENDENT_AMBULATORY_CARE_PROVIDER_SITE_OTHER): Payer: Medicare Other

## 2022-03-20 VITALS — Wt 174.0 lb

## 2022-03-20 DIAGNOSIS — Z0000 Encounter for general adult medical examination without abnormal findings: Secondary | ICD-10-CM | POA: Diagnosis not present

## 2022-03-20 NOTE — Patient Instructions (Signed)
Brittany [name] , Thank you for taking time to come for your Medicare Wellness Visit. I appreciate your ongoing commitment to your health goals. Please review the following plan we discussed and let me know if I can assist you in the future.   These are the goals we discussed:  Goals       <enter goal here> (pt-stated)      Maintain current health by remaining active and eating healthy.         This is a list of the screening recommended for you and due dates:  Health Maintenance  Topic Date Due   Zoster (Shingles) Vaccine (1 of 2) Never done   Complete foot exam   01/23/2020   COVID-19 Vaccine (4 - Pfizer series) 07/10/2020   Eye exam for diabetics  10/15/2020   Hemoglobin A1C  06/19/2021   Flu Shot  01/27/2022   Tetanus Vaccine  04/20/2028   Pneumonia Vaccine  Completed   DEXA scan (bone density measurement)  Completed   HPV Vaccine  Aged Out    Advanced directives: Please bring a copy of your health care power of attorney and living will to the office to be added to your chart at your convenience.   Conditions/risks identified: Aim for 30 minutes of exercise or brisk walking, 6-8 glasses of water, and 5 servings of fruits and vegetables each day.   Next appointment: Follow up in one year for your annual wellness visit    Preventive Care 65 Years and Older, Female Preventive care refers to lifestyle choices and visits with your health care provider that can promote health and wellness. What does preventive care include? A yearly physical exam. This is also called an annual well check. Dental exams once or twice a year. Routine eye exams. Ask your health care provider how often you should have your eyes checked. Personal lifestyle choices, including: Daily care of your teeth and gums. Regular physical activity. Eating a healthy diet. Avoiding tobacco and drug use. Limiting alcohol use. Practicing safe sex. Taking low-dose aspirin every day. Taking vitamin and mineral  supplements as recommended by your health care provider. What happens during an annual well check? The services and screenings done by your health care provider during your annual well check will depend on your age, overall health, lifestyle risk factors, and family history of disease. Counseling  Your health care provider may ask you questions about your: Alcohol use. Tobacco use. Drug use. Emotional well-being. Home and relationship well-being. Sexual activity. Eating habits. History of falls. Memory and ability to understand (cognition). Work and work Statistician. Reproductive health. Screening  You may have the following tests or measurements: Height, weight, and BMI. Blood pressure. Lipid and cholesterol levels. These may be checked every 5 years, or more frequently if you are over 62 years old. Skin check. Lung cancer screening. You may have this screening every year starting at age 56 if you have a 30-pack-year history of smoking and currently smoke or have quit within the past 15 years. Fecal occult blood test (FOBT) of the stool. You may have this test every year starting at age 5. Flexible sigmoidoscopy or colonoscopy. You may have a sigmoidoscopy every 5 years or a colonoscopy every 10 years starting at age 89. Hepatitis C blood test. Hepatitis B blood test. Sexually transmitted disease (STD) testing. Diabetes screening. This is done by checking your blood sugar (glucose) after you have not eaten for a while (fasting). You may have this done every 1-3  years. Bone density scan. This is done to screen for osteoporosis. You may have this done starting at age 31. Mammogram. This may be done every 1-2 years. Talk to your health care provider about how often you should have regular mammograms. Talk with your health care provider about your test results, treatment options, and if necessary, the need for more tests. Vaccines  Your health care provider may recommend certain  vaccines, such as: Influenza vaccine. This is recommended every year. Tetanus, diphtheria, and acellular pertussis (Tdap, Td) vaccine. You may need a Td booster every 10 years. Zoster vaccine. You may need this after age 76. Pneumococcal 13-valent conjugate (PCV13) vaccine. One dose is recommended after age 50. Pneumococcal polysaccharide (PPSV23) vaccine. One dose is recommended after age 44. Talk to your health care provider about which screenings and vaccines you need and how often you need them. This information is not intended to replace advice given to you by your health care provider. Make sure you discuss any questions you have with your health care provider. Document Released: 07/12/2015 Document Revised: 03/04/2016 Document Reviewed: 04/16/2015 Elsevier Interactive Patient Education  2017 [place] Prevention in the Home Falls can cause injuries. They can happen to people of all ages. There are many things you can do to make your home safe and to help prevent falls. What can I do on the outside of my home? Regularly fix the edges of walkways and driveways and fix any cracks. Remove anything that might make you trip as you walk through a door, such as a raised step or threshold. Trim any bushes or trees on the path to your home. Use bright outdoor lighting. Clear any walking paths of anything that might make someone trip, such as rocks or tools. Regularly check to see if handrails are loose or broken. Make sure that both sides of any steps have handrails. Any raised decks and porches should have guardrails on the edges. Have any leaves, snow, or ice cleared regularly. Use sand or salt on walking paths during winter. Clean up any spills in your garage right away. This includes oil or grease spills. What can I do in the bathroom? Use night lights. Install grab bars by the toilet and in the tub and shower. Do not use towel bars as grab bars. Use non-skid mats or decals in  the tub or shower. If you need to sit down in the shower, use a plastic, non-slip stool. Keep the floor dry. Clean up any water that spills on the floor as soon as it happens. Remove soap buildup in the tub or shower regularly. Attach bath mats securely with double-sided non-slip rug tape. Do not have throw rugs and other things on the floor that can make you trip. What can I do in the bedroom? Use night lights. Make sure that you have a light by your bed that is easy to reach. Do not use any sheets or blankets that are too big for your bed. They should not hang down onto the floor. Have a firm chair that has side arms. You can use this for support while you get dressed. Do not have throw rugs and other things on the floor that can make you trip. What can I do in the kitchen? Clean up any spills right away. Avoid walking on wet floors. Keep items that you use a lot in easy-to-reach places. If you need to reach something above you, use a strong step stool that has a grab  bar. Keep electrical cords out of the way. Do not use floor polish or wax that makes floors slippery. If you must use wax, use non-skid floor wax. Do not have throw rugs and other things on the floor that can make you trip. What can I do with my stairs? Do not leave any items on the stairs. Make sure that there are handrails on both sides of the stairs and use them. Fix handrails that are broken or loose. Make sure that handrails are as long as the stairways. Check any carpeting to make sure that it is firmly attached to the stairs. Fix any carpet that is loose or worn. Avoid having throw rugs at the top or bottom of the stairs. If you do have throw rugs, attach them to the floor with carpet tape. Make sure that you have a light switch at the top of the stairs and the bottom of the stairs. If you do not have them, ask someone to add them for you. What else can I do to help prevent falls? Wear shoes that: Do not have high  heels. Have rubber bottoms. Are comfortable and fit you well. Are closed at the toe. Do not wear sandals. If you use a stepladder: Make sure that it is fully opened. Do not climb a closed stepladder. Make sure that both sides of the stepladder are locked into place. Ask someone to hold it for you, if possible. Clearly mark and make sure that you can see: Any grab bars or handrails. First and last steps. Where the edge of each step is. Use tools that help you move around (mobility aids) if they are needed. These include: Canes. Walkers. Scooters. Crutches. Turn on the lights when you go into a dark area. Replace any light bulbs as soon as they burn out. Set up your furniture so you have a clear path. Avoid moving your furniture around. If any of your floors are uneven, fix them. If there are any pets around you, be aware of where they are. Review your medicines with your doctor. Some medicines can make you feel dizzy. This can increase your chance of falling. Ask your doctor what other things that you can do to help prevent falls. This information is not intended to replace advice given to you by your health care provider. Make sure you discuss any questions you have with your health care provider. Document Released: 04/11/2009 Document Revised: 11/21/2015 Document Reviewed: 07/20/2014 Elsevier Interactive Patient Education  2017 [employer].

## 2022-03-20 NOTE — Progress Notes (Signed)
Subjective:   Brittany [name] is a 88 y.o. female who presents for Medicare Annual (Subsequent) preventive examination.  Virtual Visit via Telephone Note  I connected with  Brittany [name] on 03/20/22 at  9:45 AM EDT by telephone and verified that I am speaking with the correct person using two identifiers.  Location: Patient: home  Provider: Paulla Fore  Persons participating in the virtual visit: patient/Nurse Health Advisor   I discussed the limitations, risks, security and privacy concerns of performing an evaluation and management service by telephone and the availability of in person appointments. The patient expressed understanding and agreed to proceed.  Interactive audio and video telecommunications were attempted between this nurse and patient, however failed, due to patient having technical difficulties OR patient did not have access to video capability.  We continued and completed visit with audio only.  Some vital signs may be absent or patient reported.   Daphane Shepherd, LPN  Review of Systems     Cardiac Risk Factors include: advanced age (>40mn, >>51women);hypertension     Objective:    Today's Vitals   03/20/22 0951  Weight: 174 lb (78.9 kg)   Body mass index is 41.95 kg/m.     03/20/2022    9:56 AM 02/20/2017   11:18 AM 06/04/2016    8:24 AM 11/29/2014    7:20 AM  Advanced Directives  Does Patient Have a Medical Advance Directive? Yes No No No  Type of AParamedicof [place]outh English[place] will     Copy of [place] Chart? No - copy requested     Would patient like information on creating a medical advance directive?   No - Patient declined     Current Medications (verified) Outpatient Encounter Medications as of 03/20/2022  Medication Sig   atenolol (TENORMIN) 50 MG tablet Take 1 tablet (50 mg total) by mouth daily.   No facility-administered encounter medications on file as of 03/20/2022.    Allergies  (verified) Hctz [hydrochlorothiazide], Amlodipine besylate, Chlorthalidone, Indapamide, Olmesartan, Alendronate sodium, Lisinopril, Spironolactone, Bystolic [nebivolol hcl], and Valsartan   History: Past Medical History:  Diagnosis Date   Cancer ([place]    right breast   Diabetes mellitus    type 2. 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   Diverticulosis of colon    DJD (degenerative joint disease) of knee    Left   Hyperlipidemia    Hypertension    Osteoporosis    Personal history of radiation therapy    Positive PPD    history off   Past Surgical History:  Procedure Laterality Date   BREAST LUMPECTOMY Right 2010   BREAST SURGERY  2010   + RT   Family History  Problem Relation Age of Onset   Heart disease Mother    Diabetes Father    Diabetes Other        1st degree relative   Hypertension Other    Stroke Other    Cancer Other        cervical   Lupus Other    Coronary artery disease Other    Social History   Socioeconomic History   Marital status: Widowed    Spouse name: Not on file   Number of children: 4   Years of education: Not on file   Highest education level: Not on file  Occupational History   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  Tobacco Use  Smoking status: Never   Smokeless tobacco: Never  Vaping Use   Vaping Use: Never used  Substance and Sexual Activity   Alcohol use: No   Drug use: No   Sexual activity: Never  Other Topics Concern   Not on file  Social History Narrative   Widow   4 children   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   Social Determinants of Health   Financial Resource Strain: Low Risk  (03/20/2022)   Overall Financial Resource Strain (CARDIA)    Difficulty of Paying Living Expenses: Not hard at all  Food Insecurity: No Food Insecurity (03/20/2022)   Hunger Vital Sign    Worried About Running Out of Food in the Last Year: Never true    Ran Out of Food in the Last Year: Never true  Transportation  Needs: No Transportation Needs (03/20/2022)   PRAPARE - Hydrologist (Medical): No    Lack of Transportation (Non-Medical): No  Physical Activity: Inactive (03/20/2022)   Exercise Vital Sign    Days of Exercise per Week: 0 days    Minutes of Exercise per Session: 0 min  Stress: No Stress Concern Present (03/20/2022)   [place]    Feeling of Stress : Not at all  Social Connections: Socially Isolated (03/20/2022)   Social Connection and Isolation Panel [NHANES]    Frequency of Communication with Friends and Family: More than three times a week    Frequency of Social Gatherings with Friends and Family: More than three times a week    Attends Religious Services: Never    Marine scientist or Organizations: No    Attends Archivist Meetings: Never    Marital Status: Widowed    Tobacco Counseling Counseling given: Not Answered   Clinical Intake:  Pre-visit preparation completed: Yes  Pain : No/denies pain     Nutritional Risks: None Diabetes: No  How often do you need to have someone help you when you read instructions, pamphlets, or other written materials from your doctor or pharmacy?: 1 - Never  Diabetic?no  Interpreter Needed?: No  Information entered by :: Jadene Pierini, LPN   Activities of Daily Living    03/20/2022    9:57 AM  In your present state of health, do you have any difficulty performing the following activities:  Hearing? 1  Vision? 1  Difficulty concentrating or making decisions? 1  Walking or climbing stairs? 1  Dressing or bathing? 1  Doing errands, shopping? 1  Preparing Food and eating ? Y  Using the Toilet? Y  In the past six months, have you accidently leaked urine? Y  Do you have problems with loss of bowel control? Y  Managing your Medications? Y  Managing your Finances? Y  Housekeeping or managing your Housekeeping? Y    Patient  Care Team: Janith Lima, MD as PCP - General Tamala Julian Lynnell Dike, MD as PCP - Cardiology (Cardiology) Eston Esters, MD (Inactive) (Hematology and Oncology) Kyung Rudd, MD (Radiation Oncology) Neldon Mc, MD (Inactive) as Surgeon (General Surgery)  Indicate any recent Medical Services you may have received from other than [facility] providers in the past year (date may be approximate).     Assessment:   This is a routine wellness examination for [facility].  Hearing/Vision screen Vision Screening - Comments:: Unknown per patient   Dietary issues and exercise activities discussed: Current Exercise Habits: The patient does not participate in regular exercise  at present, Exercise limited by: orthopedic condition(s)   Goals Addressed               This Visit's Progress     <enter goal here> (pt-stated)   On track     Maintain current health by remaining active and eating healthy.        Depression Screen    03/20/2022    9:55 AM 12/18/2020    3:39 PM 09/20/2019    9:54 AM 04/21/2018    1:00 PM 07/20/2017    9:56 AM 06/04/2016    8:25 AM 01/08/2015    8:19 AM  PHQ 2/9 Scores  PHQ - 2 Score 0 0 0 0 0 0 0    Fall Risk    03/20/2022    9:54 AM 12/18/2020    3:39 PM 09/20/2019    9:54 AM 04/21/2018    1:00 PM 07/20/2017    9:56 AM  Fall Risk   Falls in the past year? 0 0 0 No No  Number falls in past yr: 0  0    Injury with Fall? 0  0    Risk for fall due to : No Fall Risks  Impaired balance/gait    Follow up Falls prevention discussed  Falls evaluation completed      FALL RISK PREVENTION PERTAINING TO THE HOME:  Any stairs in or around the home? No  If so, are there any without handrails? No  Home free of loose throw rugs in walkways, pet beds, electrical cords, etc? Yes  Adequate lighting in your home to reduce risk of falls? Yes   ASSISTIVE DEVICES UTILIZED TO PREVENT FALLS:  Life alert? No  Use of a cane, walker or w/c? No  Grab bars in the bathroom? No  Shower  chair or bench in shower? No  Elevated toilet seat or a handicapped toilet? No        03/20/2022    9:57 AM  6CIT Screen  What Year? 0 points  What month? 0 points  What time? 0 points  Count back from 20 2 points  Months in reverse 2 points  Repeat phrase 2 points  Total Score 6 points    Immunizations Immunization History  Administered Date(s) Administered   Fluad Quad(high Dose 65+) 04/21/2021   Influenza Whole 03/31/2012, 03/21/2013   Influenza, High Dose Seasonal PF 05/30/2019   Influenza,inj,Quad PF,6+ Mos 02/28/2020   Influenza-Unspecified 02/28/2015, 04/07/2016, 02/27/2018   PFIZER(Purple Top)SARS-COV-2 Vaccination 07/20/2019, 07/31/2019, 05/15/2020   Pneumococcal Conjugate-13 12/25/2013   Pneumococcal Polysaccharide-23 04/30/2005, 03/12/2010, 01/22/2016   Td 06/29/1994   Tdap 04/20/2018   Zoster, Live 09/23/2010    TDAP status: Up to date  Flu Vaccine status: Up to date  Pneumococcal vaccine status: Up to date  Covid-19 vaccine status: Completed vaccines  Qualifies for Shingles Vaccine? Yes   Zostavax completed No   Shingrix Completed?: No.    Education has been provided regarding the importance of this vaccine. Patient has been advised to call insurance company to determine out of pocket expense if they have not yet received this vaccine. Advised may also receive vaccine at local pharmacy or Health Dept. Verbalized acceptance and understanding.  Screening Tests Health Maintenance  Topic Date Due   Zoster Vaccines- Shingrix (1 of 2) Never done   FOOT EXAM  01/23/2020   COVID-19 Vaccine (4 - Pfizer series) 07/10/2020   OPHTHALMOLOGY EXAM  10/15/2020   HEMOGLOBIN A1C  06/19/2021   INFLUENZA VACCINE  01/27/2022  TETANUS/TDAP  04/20/2028   Pneumonia Vaccine 1+ Years old  Completed   DEXA SCAN  Completed   HPV VACCINES  Aged Out    Health Maintenance  Health Maintenance Due  Topic Date Due   Zoster Vaccines- Shingrix (1 of 2) Never done   FOOT  EXAM  01/23/2020   COVID-19 Vaccine (4 - Pfizer series) 07/10/2020   OPHTHALMOLOGY EXAM  10/15/2020   HEMOGLOBIN A1C  06/19/2021   INFLUENZA VACCINE  01/27/2022    Colorectal cancer screening: No longer required.   Mammogram status: No longer required due to age.  Bone Density status: Completed 08/04/2016. Results reflect: Bone density results: OSTEOPENIA. Repeat every 5 years.  Lung Cancer Screening: (Low Dose CT Chest recommended if Age 49-80 years, 30 pack-year currently smoking OR have quit w/in 15years.) does not qualify.   Lung Cancer Screening Referral: n/a  Additional Screening:  Hepatitis C Screening: does not qualify;   Vision Screening: Recommended annual ophthalmology exams for early detection of glaucoma and other disorders of the eye. Is the patient up to date with their annual eye exam?  No  Who is the provider or what is the name of the office in which the patient attends annual eye exams? Declines at this time  If pt is not established with a provider, would they like to be referred to a provider to establish care? No .   Dental Screening: Recommended annual dental exams for proper oral hygiene  Community Resource Referral / Chronic Care Management: CRR required this visit?  No   CCM required this visit?  No      Plan:     I have personally reviewed and noted the following in the patient's chart:   Medical and social history Use of alcohol, tobacco or illicit drugs  Current medications and supplements including opioid prescriptions. Patient is not currently taking opioid prescriptions. Functional ability and status Nutritional status Physical activity Advanced directives List of other physicians Hospitalizations, surgeries, and ER visits in previous 12 months Vitals Screenings to include cognitive, depression, and falls Referrals and appointments  In addition, I have reviewed and discussed with patient certain preventive protocols, quality  metrics, and best practice recommendations. A written personalized care plan for preventive services as well as general preventive health recommendations were provided to patient.     Daphane Shepherd, LPN   [id number]   Nurse Notes: Due eye exam

## 2024-05-01 ENCOUNTER — Ambulatory Visit: Payer: Self-pay

## 2024-05-01 NOTE — Telephone Encounter (Signed)
 FYI Only or Action Required?: FYI only for provider: appointment scheduled on 11.4.25.  Patient was last seen in primary care on 03/20/2022.  Called Nurse Triage reporting Vaginal Bleeding.  Symptoms began unknwon.  Interventions attempted: Nothing.  Symptoms are: unchanged.  Triage Disposition: See PCP Within 2 Weeks  Patient/caregiver understands and will follow disposition?: Yes   Copied from CRM [id number]. Topic: Clinical - Red Word Triage >> May 01, 2024  4:10 PM Viola F wrote: Patient having vaginal bleeding - her son Ubaldo is on the phone requesting appt Reason for Disposition  Bleeding or spotting occurs after hysterectomy  Answer Assessment - Initial Assessment Questions Pts son called stating that his gf noticed that his mother was having some bleeding like she was on her period. Rn asked triage question and on was unsure of all answers. He states he just wants her evaluated. He did say she wasn't having any fever or pain. RN advised if those things happen to go to the ER for evaluation. He stated understanding.  Protocols used: Vaginal Bleeding - Abnormal-A-AH

## 2024-05-02 ENCOUNTER — Ambulatory Visit: Admitting: Family Medicine

## 2024-05-02 ENCOUNTER — Ambulatory Visit: Admitting: Internal Medicine

## 2024-05-02 NOTE — Progress Notes (Deleted)
   Acute Office Visit  Subjective:     Patient ID: Brittany [name], female    DOB: 1928/07/20, 89 y.o.   MRN: [id number]  No chief complaint on file.   HPI  Discussed the use of AI scribe software for clinical note transcription with the patient, who gave verbal consent to proceed.  History of Present Illness      ROS Per HPI      Objective:    There were no vitals taken for this visit.   Physical Exam Vitals and nursing note reviewed.  Constitutional:      General: She is not in acute distress.    Appearance: Normal appearance. She is normal weight.  HENT:     Head: Normocephalic and atraumatic.     Right Ear: External ear normal.     Left Ear: External ear normal.     Nose: Nose normal.     Mouth/Throat:     Mouth: Mucous membranes are moist.     Pharynx: Oropharynx is clear.  Eyes:     Extraocular Movements: Extraocular movements intact.     Pupils: Pupils are equal, round, and reactive to light.  Cardiovascular:     Rate and Rhythm: Normal rate and regular rhythm.     Pulses: Normal pulses.     Heart sounds: Normal heart sounds.  Pulmonary:     Effort: Pulmonary effort is normal. No respiratory distress.     Breath sounds: Normal breath sounds. No wheezing, rhonchi or rales.  Musculoskeletal:        General: Normal range of motion.     Cervical back: Normal range of motion.     Right lower leg: No edema.     Left lower leg: No edema.  Lymphadenopathy:     Cervical: No cervical adenopathy.  Neurological:     General: No focal deficit present.     Mental Status: She is alert and oriented to person, place, and time.  Psychiatric:        Mood and Affect: Mood normal.        Thought Content: Thought content normal.     No results found for any visits on 05/02/24.      Assessment & Plan:   Assessment and Plan Assessment & Plan      No orders of the defined types were placed in this encounter.    No orders of the defined types were placed  in this encounter.   No follow-ups on file.  Corean LITTIE Ku, FNP

## 2024-05-04 NOTE — Telephone Encounter (Signed)
Patient was a NS

## 2024-05-10 ENCOUNTER — Telehealth: Payer: Self-pay

## 2024-05-10 NOTE — Telephone Encounter (Signed)
 Patient is overdue for an appointment. LMOM for patient to call and schedule.

## 2024-05-16 ENCOUNTER — Telehealth: Payer: Self-pay

## 2024-05-16 NOTE — Telephone Encounter (Signed)
 [facility] listed as PCP with [employer]. LMOM for pt to call back and establish care or let us  know who her PCP is. She will need to tell [facility] as well.

## 2024-05-21 ENCOUNTER — Encounter (HOSPITAL_COMMUNITY): Payer: Self-pay

## 2024-05-21 ENCOUNTER — Emergency Department (HOSPITAL_COMMUNITY)

## 2024-05-21 ENCOUNTER — Inpatient Hospital Stay (HOSPITAL_COMMUNITY)
Admission: EM | Admit: 2024-05-21 | Discharge: 2024-05-29 | DRG: 378 | Disposition: A | Discharge status: Skilled Nursing Facility | Attending: Internal Medicine | Admitting: Internal Medicine

## 2024-05-21 ENCOUNTER — Other Ambulatory Visit: Payer: Self-pay

## 2024-05-21 DIAGNOSIS — Z833 Family history of diabetes mellitus: Secondary | ICD-10-CM

## 2024-05-21 DIAGNOSIS — Z888 Allergy status to other drugs, medicaments and biological substances status: Secondary | ICD-10-CM

## 2024-05-21 DIAGNOSIS — D509 Iron deficiency anemia, unspecified: Secondary | ICD-10-CM | POA: Diagnosis present

## 2024-05-21 DIAGNOSIS — Z853 Personal history of malignant neoplasm of breast: Secondary | ICD-10-CM

## 2024-05-21 DIAGNOSIS — N179 Acute kidney failure, unspecified: Secondary | ICD-10-CM | POA: Diagnosis present

## 2024-05-21 DIAGNOSIS — K317 Polyp of stomach and duodenum: Secondary | ICD-10-CM | POA: Diagnosis present

## 2024-05-21 DIAGNOSIS — K5730 Diverticulosis of large intestine without perforation or abscess without bleeding: Secondary | ICD-10-CM | POA: Diagnosis present

## 2024-05-21 DIAGNOSIS — B9681 Helicobacter pylori [H. pylori] as the cause of diseases classified elsewhere: Secondary | ICD-10-CM | POA: Diagnosis present

## 2024-05-21 DIAGNOSIS — E785 Hyperlipidemia, unspecified: Secondary | ICD-10-CM | POA: Diagnosis present

## 2024-05-21 DIAGNOSIS — K449 Diaphragmatic hernia without obstruction or gangrene: Secondary | ICD-10-CM | POA: Diagnosis present

## 2024-05-21 DIAGNOSIS — N1831 Chronic kidney disease, stage 3a: Secondary | ICD-10-CM | POA: Diagnosis present

## 2024-05-21 DIAGNOSIS — F03A0 Unspecified dementia, mild, without behavioral disturbance, psychotic disturbance, mood disturbance, and anxiety: Secondary | ICD-10-CM | POA: Diagnosis present

## 2024-05-21 DIAGNOSIS — E66811 Obesity, class 1: Secondary | ICD-10-CM | POA: Diagnosis present

## 2024-05-21 DIAGNOSIS — E1122 Type 2 diabetes mellitus with diabetic chronic kidney disease: Secondary | ICD-10-CM | POA: Diagnosis present

## 2024-05-21 DIAGNOSIS — Z8249 Family history of ischemic heart disease and other diseases of the circulatory system: Secondary | ICD-10-CM

## 2024-05-21 DIAGNOSIS — M1712 Unilateral primary osteoarthritis, left knee: Secondary | ICD-10-CM | POA: Diagnosis present

## 2024-05-21 DIAGNOSIS — M810 Age-related osteoporosis without current pathological fracture: Secondary | ICD-10-CM | POA: Diagnosis present

## 2024-05-21 DIAGNOSIS — K2940 Chronic atrophic gastritis without bleeding: Secondary | ICD-10-CM | POA: Diagnosis present

## 2024-05-21 DIAGNOSIS — Z751 Person awaiting admission to adequate facility elsewhere: Secondary | ICD-10-CM

## 2024-05-21 DIAGNOSIS — R339 Retention of urine, unspecified: Secondary | ICD-10-CM | POA: Diagnosis not present

## 2024-05-21 DIAGNOSIS — K319 Disease of stomach and duodenum, unspecified: Secondary | ICD-10-CM

## 2024-05-21 DIAGNOSIS — D123 Benign neoplasm of transverse colon: Secondary | ICD-10-CM | POA: Diagnosis present

## 2024-05-21 DIAGNOSIS — K3189 Other diseases of stomach and duodenum: Secondary | ICD-10-CM | POA: Diagnosis present

## 2024-05-21 DIAGNOSIS — I129 Hypertensive chronic kidney disease with stage 1 through stage 4 chronic kidney disease, or unspecified chronic kidney disease: Secondary | ICD-10-CM | POA: Diagnosis present

## 2024-05-21 DIAGNOSIS — E870 Hyperosmolality and hypernatremia: Secondary | ICD-10-CM | POA: Diagnosis present

## 2024-05-21 DIAGNOSIS — Z923 Personal history of irradiation: Secondary | ICD-10-CM

## 2024-05-21 DIAGNOSIS — R4189 Other symptoms and signs involving cognitive functions and awareness: Secondary | ICD-10-CM | POA: Diagnosis not present

## 2024-05-21 DIAGNOSIS — Z79899 Other long term (current) drug therapy: Secondary | ICD-10-CM

## 2024-05-21 DIAGNOSIS — Z860100 Personal history of colon polyps, unspecified: Secondary | ICD-10-CM

## 2024-05-21 DIAGNOSIS — I10 Essential (primary) hypertension: Secondary | ICD-10-CM | POA: Diagnosis present

## 2024-05-21 DIAGNOSIS — K5521 Angiodysplasia of colon with hemorrhage: Principal | ICD-10-CM | POA: Diagnosis present

## 2024-05-21 DIAGNOSIS — D126 Benign neoplasm of colon, unspecified: Secondary | ICD-10-CM

## 2024-05-21 DIAGNOSIS — E876 Hypokalemia: Secondary | ICD-10-CM | POA: Diagnosis present

## 2024-05-21 DIAGNOSIS — D649 Anemia, unspecified: Principal | ICD-10-CM | POA: Diagnosis present

## 2024-05-21 DIAGNOSIS — Z6834 Body mass index (BMI) 34.0-34.9, adult: Secondary | ICD-10-CM

## 2024-05-21 DIAGNOSIS — K5520 Angiodysplasia of colon without hemorrhage: Secondary | ICD-10-CM

## 2024-05-21 DIAGNOSIS — E538 Deficiency of other specified B group vitamins: Secondary | ICD-10-CM | POA: Diagnosis present

## 2024-05-21 LAB — COMPREHENSIVE METABOLIC PANEL WITH GFR
ALT: 9 U/L (ref 0–44)
AST: 24 U/L (ref 15–41)
Albumin: 3.5 g/dL (ref 3.5–5.0)
Alkaline Phosphatase: 69 U/L (ref 38–126)
Anion gap: 15 (ref 5–15)
BUN: 19 mg/dL (ref 8–23)
CO2: 17 mmol/L — ABNORMAL LOW (ref 22–32)
Calcium: 8.2 mg/dL — ABNORMAL LOW (ref 8.9–10.3)
Chloride: 110 mmol/L (ref 98–111)
Creatinine, Ser: 1.68 mg/dL — ABNORMAL HIGH (ref 0.44–1.00)
GFR, Estimated: 28 mL/min — ABNORMAL LOW (ref >60)
Glucose, Bld: 124 mg/dL — ABNORMAL HIGH (ref 70–99)
Potassium: 4.3 mmol/L (ref 3.5–5.1)
Sodium: 142 mmol/L (ref 135–145)
Total Bilirubin: 0.3 mg/dL (ref 0.0–1.2)
Total Protein: 6.8 g/dL (ref 6.5–8.1)

## 2024-05-21 LAB — CBC WITH DIFFERENTIAL/PLATELET
Abs Immature Granulocytes: 0.08 K/uL — ABNORMAL HIGH (ref 0.00–0.07)
Basophils Absolute: 0 K/uL (ref 0.0–0.1)
Basophils Relative: 0 %
Eosinophils Absolute: 0 K/uL (ref 0.0–0.5)
Eosinophils Relative: 0 %
HCT: 14.3 % — ABNORMAL LOW (ref 36.0–46.0)
Hemoglobin: 4 g/dL — CL (ref 12.0–15.0)
Immature Granulocytes: 1 %
Lymphocytes Relative: 15 %
Lymphs Abs: 1.5 K/uL (ref 0.7–4.0)
MCH: 25.5 pg — ABNORMAL LOW (ref 26.0–34.0)
MCHC: 28 g/dL — ABNORMAL LOW (ref 30.0–36.0)
MCV: 91.1 fL (ref 80.0–100.0)
Monocytes Absolute: 0.9 K/uL (ref 0.1–1.0)
Monocytes Relative: 10 %
Neutro Abs: 7.4 K/uL (ref 1.7–7.7)
Neutrophils Relative %: 74 %
Platelets: 286 K/uL (ref 150–400)
RBC: 1.57 MIL/uL — ABNORMAL LOW (ref 3.87–5.11)
RDW: 17.6 % — ABNORMAL HIGH (ref 11.5–15.5)
Smear Review: NORMAL
WBC: 9.9 K/uL (ref 4.0–10.5)
nRBC: 2.4 % — ABNORMAL HIGH (ref 0.0–0.2)

## 2024-05-21 LAB — AMMONIA: Ammonia: 21 umol/L (ref 9–35)

## 2024-05-21 LAB — URINALYSIS, W/ REFLEX TO CULTURE (INFECTION SUSPECTED)
Bacteria, UA: NONE SEEN
Bilirubin Urine: NEGATIVE
Glucose, UA: NEGATIVE mg/dL
Hgb urine dipstick: NEGATIVE
Ketones, ur: NEGATIVE mg/dL
Leukocytes,Ua: NEGATIVE
Nitrite: NEGATIVE
Protein, ur: NEGATIVE mg/dL
Specific Gravity, Urine: 1.016 (ref 1.005–1.030)
pH: 5 (ref 5.0–8.0)

## 2024-05-21 LAB — RETICULOCYTES
Immature Retic Fract: 8.7 % (ref 2.3–15.9)
RBC.: 1.57 MIL/uL — ABNORMAL LOW (ref 3.87–5.11)
Retic Count, Absolute: 33.1 K/uL (ref 19.0–186.0)
Retic Ct Pct: 2.1 % (ref 0.4–3.1)

## 2024-05-21 LAB — ABO/RH: ABO/RH(D): B POS

## 2024-05-21 LAB — IRON AND TIBC
Iron: <10 ug/dL — ABNORMAL LOW (ref 28–170)
TIBC: 283 ug/dL (ref 250–450)

## 2024-05-21 LAB — POC OCCULT BLOOD, ED: Fecal Occult Bld: NEGATIVE

## 2024-05-21 LAB — PREPARE RBC (CROSSMATCH)

## 2024-05-21 LAB — FERRITIN: Ferritin: 43 ng/mL (ref 11–307)

## 2024-05-21 LAB — VITAMIN B12: Vitamin B-12: 240 pg/mL (ref 180–914)

## 2024-05-21 LAB — FOLATE: Folate: >20 ng/mL (ref >5.9)

## 2024-05-21 MED ORDER — LACTATED RINGERS IV BOLUS
500.0000 mL | Freq: Once | INTRAVENOUS | Status: AC
  Administered 2024-05-21: 500 mL via INTRAVENOUS

## 2024-05-21 MED ORDER — SODIUM CHLORIDE 0.9% IV SOLUTION: Freq: Once | INTRAVENOUS | Status: AC

## 2024-05-21 MED ORDER — TRAZODONE HCL 50 MG PO TABS: 25.0000 mg | ORAL_TABLET | Freq: Every evening | ORAL | Status: DC | PRN

## 2024-05-21 MED ORDER — ONDANSETRON HCL 4 MG/2ML IJ SOLN: 4.0000 mg | Freq: Four times a day (QID) | INTRAMUSCULAR | Status: DC | PRN

## 2024-05-21 MED ORDER — ACETAMINOPHEN 325 MG PO TABS
650.0000 mg | ORAL_TABLET | Freq: Four times a day (QID) | ORAL | Status: DC | PRN
  Administered 2024-05-25 (×2): 650 mg via ORAL
  Filled 2024-05-21 (×2): qty 2

## 2024-05-21 MED ORDER — ALBUTEROL SULFATE (2.5 MG/3ML) 0.083% IN NEBU: 2.5000 mg | INHALATION_SOLUTION | RESPIRATORY_TRACT | Status: DC | PRN

## 2024-05-21 MED ORDER — ONDANSETRON HCL 4 MG PO TABS: 4.0000 mg | ORAL_TABLET | Freq: Four times a day (QID) | ORAL | Status: DC | PRN

## 2024-05-21 MED ORDER — SODIUM CHLORIDE 0.9 % IV SOLN: INTRAVENOUS | Status: AC

## 2024-05-21 MED ORDER — INFLUENZA VAC SPLIT HIGH-DOSE 0.5 ML IM SUSY
0.5000 mL | PREFILLED_SYRINGE | INTRAMUSCULAR | Status: DC
  Filled 2024-05-21: qty 0.5

## 2024-05-21 MED ORDER — ACETAMINOPHEN 650 MG RE SUPP: 650.0000 mg | Freq: Four times a day (QID) | RECTAL | Status: DC | PRN

## 2024-05-21 NOTE — ED Provider Notes (Signed)
 [place] EMERGENCY DEPARTMENT AT [facility] Provider Note   CSN: [id number] Arrival date & time: 05/21/24  1258     Patient presents with: Fatigue   Brittany [name] is a 89 y.o. female.   HPI   Patient has a history of hypertension osteoporosis hyperlipidemia arthritis diabetes.  Patient presents to the ED for evaluation of fatigue and excessive somnolence over the last few days.  Family states she has not been eating or drinking as much.  She has been sleeping a lot.  They were concerned so they called EMS.  EMS reported her blood sugar was slightly elevated.  Patient has not had any vomiting or diarrhea.  No known fevers.  She has not been complaining of any pain.  Prior to Admission medications   Medication Sig Start Date End Date Taking? Authorizing Provider  atenolol  (TENORMIN ) 50 MG tablet Take 1 tablet (50 mg total) by mouth daily. 04/21/21   Rollene Almarie LABOR, MD    Allergies: Hctz [hydrochlorothiazide ], Amlodipine  besylate, Chlorthalidone , Indapamide , Olmesartan , Alendronate  sodium, Lisinopril, Spironolactone , Bystolic  [nebivolol  hcl], and Valsartan     Review of Systems  Updated Vital Signs BP (!) 120/59 (BP Location: Left Arm)   Pulse 83   Temp (!) 97.5 F (36.4 C) (Oral)   Resp 16   Ht 1.448 m (4' 9)   SpO2 91%   BMI 37.65 kg/m   Physical Exam Vitals and nursing note reviewed.  Constitutional:      Appearance: She is well-developed. She is not diaphoretic.  HENT:     Head: Normocephalic and atraumatic.     Right Ear: External ear normal.     Left Ear: External ear normal.  Eyes:     General: No scleral icterus.       Right eye: No discharge.        Left eye: No discharge.     Conjunctiva/sclera: Conjunctivae normal.  Neck:     Trachea: No tracheal deviation.  Cardiovascular:     Rate and Rhythm: Normal rate and regular rhythm.  Pulmonary:     Effort: Pulmonary effort is normal. No respiratory distress.     Breath sounds: Normal  breath sounds. No stridor. No wheezing or rales.  Abdominal:     General: Bowel sounds are normal. There is no distension.     Palpations: Abdomen is soft.     Tenderness: There is no abdominal tenderness. There is no guarding or rebound.  Musculoskeletal:        General: No tenderness or deformity.     Cervical back: Neck supple.  Skin:    General: Skin is warm and dry.     Findings: No rash.  Neurological:     General: No focal deficit present.     Mental Status: She is alert.     Cranial Nerves: No cranial nerve deficit, dysarthria or facial asymmetry.     Sensory: No sensory deficit.     Motor: No weakness, abnormal muscle tone or seizure activity.     Coordination: Coordination normal.     Comments: Patient sleeping when I walked into the room although does wake up when spoken to, does follow commands although appears confused as to why she is here  Psychiatric:        Mood and Affect: Mood normal.     (all labs ordered are listed, but only abnormal results are displayed) Labs Reviewed  COMPREHENSIVE METABOLIC PANEL WITH GFR - Abnormal; Notable for the following components:  Result Value   CO2 17 (*)    Glucose, Bld 124 (*)    Creatinine, Ser 1.68 (*)    Calcium  8.2 (*)    GFR, Estimated 28 (*)    All other components within normal limits  CBC WITH DIFFERENTIAL/PLATELET - Abnormal; Notable for the following components:   RBC 1.57 (*)    Hemoglobin 4.0 (*)    HCT 14.3 (*)    MCH 25.5 (*)    MCHC 28.0 (*)    RDW 17.6 (*)    nRBC 2.4 (*)    Abs Immature Granulocytes 0.08 (*)    All other components within normal limits  IRON  AND TIBC - Abnormal; Notable for the following components:   Iron  <10 (*)    All other components within normal limits  RETICULOCYTES - Abnormal; Notable for the following components:   RBC. 1.57 (*)    All other components within normal limits  AMMONIA  URINALYSIS, W/ REFLEX TO CULTURE (INFECTION SUSPECTED)  VITAMIN B12  FOLATE   FERRITIN  BASIC METABOLIC PANEL WITH GFR  CBC  POC OCCULT BLOOD, ED  TYPE AND SCREEN  PREPARE RBC (CROSSMATCH)  ABO/RH    EKG: None  Radiology: CT HEAD WO CONTRAST Result Date: 05/21/2024 CLINICAL DATA:  Altered level of consciousness EXAM: CT HEAD WITHOUT CONTRAST TECHNIQUE: Contiguous axial images were obtained from the base of the skull through the vertex without intravenous contrast. RADIATION DOSE REDUCTION: This exam was performed according to the departmental dose-optimization program which includes automated exposure control, adjustment of the mA and/or kV according to patient size and/or use of iterative reconstruction technique. COMPARISON:  None Available. FINDINGS: Brain: Scattered hypodensities throughout the periventricular white matter consistent with chronic small vessel ischemic changes. No evidence of acute infarct or hemorrhage. The lateral ventricles and midline structures appear unremarkable. No acute extra-axial fluid collections. No mass effect. Vascular: No hyperdense vessel or unexpected calcification. Diffuse atherosclerosis. Skull: Normal. Negative for fracture or focal lesion. Sinuses/Orbits: No acute finding. Other: None. IMPRESSION: 1. No acute intracranial process. Electronically Signed   By: Ozell Daring M.D.   On: 05/21/2024 15:42   DG Chest Port 1 View Result Date: 05/21/2024 EXAM: 1 VIEW(S) XRAY OF THE CHEST 05/21/2024 02:24:00 PM COMPARISON: CT chest 05/16/2015. CLINICAL HISTORY: weakness FINDINGS: LUNGS AND PLEURA: Shallow inspiration. No focal pulmonary opacity. No pleural effusion. No pneumothorax. HEART AND MEDIASTINUM: Cardiac enlargement. Dilated, calcified, and tortuous aorta. BONES AND SOFT TISSUES: Degenerative changes in the spine and shoulders. No acute osseous abnormality. IMPRESSION: 1. No acute cardiopulmonary process identified. 2. Cardiomegaly with a dilated, calcified, and tortuous aorta. Electronically signed by: Elsie Gravely MD  05/21/2024 02:29 PM EST RP Workstation: [id number]     .Critical Care  Performed by: Randol Simmonds, MD Authorized by: Randol Simmonds, MD   Critical care provider statement:    Critical care time (minutes):  30   Critical care was time spent personally by me on the following activities:  Development of treatment plan with patient or surrogate, discussions with consultants, evaluation of patient's response to treatment, examination of patient, ordering and review of laboratory studies, ordering and review of radiographic studies, ordering and performing treatments and interventions, pulse oximetry, re-evaluation of patient's condition and review of old charts    Medications Ordered in the ED  0.9 %  sodium chloride  infusion (Manually program via Guardrails IV Fluids) (has no administration in time range)  acetaminophen  (TYLENOL ) tablet 650 mg (has no administration in time range)  Or  acetaminophen  (TYLENOL ) suppository 650 mg (has no administration in time range)  traZODone  (DESYREL ) tablet 25 mg (has no administration in time range)  ondansetron  (ZOFRAN ) tablet 4 mg (has no administration in time range)    Or  ondansetron  (ZOFRAN ) injection 4 mg (has no administration in time range)  albuterol  (PROVENTIL ) (2.5 MG/3ML) 0.083% nebulizer solution 2.5 mg (has no administration in time range)  0.9 %  sodium chloride  infusion ( Intravenous New Bag/Given 05/21/24 1735)  Influenza vac split trivalent PF (FLUZONE HIGH-DOSE) injection 0.5 mL (has no administration in time range)  lactated ringers  bolus 500 mL (0 mLs Intravenous Stopped 05/21/24 1527)    Clinical Course as of 05/21/24 1754  Sun May 21, 2024  1500 Urinalysis, w/ Reflex to Culture (Infection Suspected) -Urine, Clean Catch nl [JK]  1501 Comprehensive metabolic panel(!) Cr increased since last [JK]  1507 CBC WITH DIFFERENTIAL(!!) Labs notable for hemoglobin decreased at 4.0.  White blood cell count is normal [JK]  1514 Stool brown in  color [JK]    Clinical Course User Index [JK] Randol Simmonds, MD                                 Medical Decision Making Problems Addressed: Anemia, unspecified type: acute illness or injury that poses a threat to life or bodily functions  Amount and/or Complexity of Data Reviewed Labs: ordered. Decision-making details documented in ED Course. Radiology: ordered and independent interpretation performed.  Risk Prescription drug management. Decision regarding hospitalization.   Patient presents to the ED for evaluation of fatigue.  Family denies any fevers.  No vomiting or diarrhea.  In the ED patient was answering questions appropriately.  She denied any specific complaints.  Her labs however were notable for severe anemia which appears to be the cause of her fatigue.  Her hemoglobin was 4.  Etiology of this is unclear.  No signs of acute GI bleeding.  Will send off anemia panel.  Have ordered blood transfusions.  I discussed this with the patient and she agrees.  Case discussed with Dr Roxane     Final diagnoses:  Anemia, unspecified type    ED Discharge Orders     None          Randol Simmonds, MD 05/21/24 1759

## 2024-05-21 NOTE — ED Triage Notes (Signed)
 BIBA from home for fatigue x 3 days, decreased PO intake.  102/48 bp 88 hr 95% r/a 269 cbg 97.8 t

## 2024-05-21 NOTE — H&P (Signed)
 History and Physical  Brittany [name] FMW:[id number] DOB: Dec 09, 1928 DOA: 05/21/2024  PCP: [name], MD   Chief Complaint: Fatigue, somnolence  HPI: Brittany [name] is a 89 y.o. female retired Brittany [name] with medical history significant for borderline diabetes, hypertension only on atenolol  being admitted to the hospital with symptomatic normocytic anemia.  Patient was apparently brought to the ER by family due to lethargy, fatigue, lack of appetite.  Workup as detailed below shows evidence of hemoglobin 4.0, stool Hemoccult exam is negative.  Patient denies any evidence of abdominal pain, nausea, hematochezia, melena or blood per rectum.  Tells me that she has had colonoscopies many years ago and does not recall them ever being abnormal.  She denies any personal history of cancer, or GI bleeding.  On my interview, she denies feeling lethargic or tired.  Currently there is no family at the bedside.  Review of Systems: Please see HPI for pertinent positives and negatives. A complete 10 system review of systems are otherwise negative.  Past Medical History:  Diagnosis Date   Cancer [facility])    right breast   Diabetes mellitus    type 2. 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   Diverticulosis of colon    DJD (degenerative joint disease) of knee    Left   Hyperlipidemia    Hypertension    Osteoporosis    Personal history of radiation therapy    Positive PPD    history off   Past Surgical History:  Procedure Laterality Date   BREAST LUMPECTOMY Right 2010   BREAST SURGERY  2010   + RT   Social History:  reports that she has never smoked. She has never used smokeless tobacco. She reports that she does not drink alcohol and does not use drugs.  Allergies  Allergen Reactions   Hctz [Hydrochlorothiazide ] Other (See Comments)    Reactions: sweating, heart pain, itching   Amlodipine  Besylate     Muscle aches   Chlorthalidone  Other (See Comments)    Muscle aches   Indapamide  Hives  and Itching   Olmesartan      headache   Alendronate  Sodium     REACTION: heartburn   Lisinopril     REACTION: dizziness   Spironolactone  Itching   Bystolic  [Nebivolol  Hcl] Itching and Rash   Valsartan      itching    Family History  Problem Relation Age of Onset   Heart disease Mother    Diabetes Father    Diabetes Other        1st degree relative   Hypertension Other    Stroke Other    Cancer Other        cervical   Lupus Other    Coronary artery disease Other      Prior to Admission medications   Medication Sig Start Date End Date Taking? Authorizing Provider  atenolol  (TENORMIN ) 50 MG tablet Take 1 tablet (50 mg total) by mouth daily. 04/21/21   Rollene Almarie LABOR, MD    Physical Exam: BP 115/64   Pulse 68   Temp 98.7 F (37.1 C) (Oral)   Resp 17   SpO2 97%  General:  Alert, oriented, calm, in no acute distress, patient appears younger than her stated age.  She is pale on exam. Cardiovascular: RRR, no murmurs or rubs, no peripheral edema  Respiratory: clear to auscultation bilaterally, no wheezes, no crackles  Abdomen: soft, nontender, nondistended, normal bowel tones heard  Skin: dry, no rashes  Musculoskeletal:  no joint effusions, normal range of motion  Psychiatric: appropriate affect, normal speech  Neurologic: extraocular muscles intact, clear speech, moving all extremities with intact sensorium         Labs on Admission:  Basic Metabolic Panel: Recent Labs  Lab 05/21/24 1419  NA 142  K 4.3  CL 110  CO2 17*  GLUCOSE 124*  BUN 19  CREATININE 1.68*  CALCIUM  8.2*   Liver Function Tests: Recent Labs  Lab 05/21/24 1419  AST 24  ALT 9  ALKPHOS 69  BILITOT 0.3  PROT 6.8  ALBUMIN 3.5   No results for input(s): LIPASE, AMYLASE in the last 168 hours. Recent Labs  Lab 05/21/24 1417  AMMONIA 21   CBC: Recent Labs  Lab 05/21/24 1419  WBC 9.9  NEUTROABS 7.4  HGB 4.0*  HCT 14.3*  MCV 91.1  PLT 286   Cardiac Enzymes: No results  for input(s): CKTOTAL, CKMB, CKMBINDEX, TROPONINI in the last 168 hours. BNP (last 3 results) No results for input(s): BNP in the last 8760 hours.  ProBNP (last 3 results) No results for input(s): PROBNP in the last 8760 hours.  CBG: No results for input(s): GLUCAP in the last 168 hours.  Radiological Exams on Admission: CT HEAD WO CONTRAST Result Date: 05/21/2024 CLINICAL DATA:  Altered level of consciousness EXAM: CT HEAD WITHOUT CONTRAST TECHNIQUE: Contiguous axial images were obtained from the base of the skull through the vertex without intravenous contrast. RADIATION DOSE REDUCTION: This exam was performed according to the departmental dose-optimization program which includes automated exposure control, adjustment of the mA and/or kV according to patient size and/or use of iterative reconstruction technique. COMPARISON:  None Available. FINDINGS: Brain: Scattered hypodensities throughout the periventricular white matter consistent with chronic small vessel ischemic changes. No evidence of acute infarct or hemorrhage. The lateral ventricles and midline structures appear unremarkable. No acute extra-axial fluid collections. No mass effect. Vascular: No hyperdense vessel or unexpected calcification. Diffuse atherosclerosis. Skull: Normal. Negative for fracture or focal lesion. Sinuses/Orbits: No acute finding. Other: None. IMPRESSION: 1. No acute intracranial process. Electronically Signed   By: Ozell Daring M.D.   On: 05/21/2024 15:42   DG Chest Port 1 View Result Date: 05/21/2024 EXAM: 1 VIEW(S) XRAY OF THE CHEST 05/21/2024 02:24:00 PM COMPARISON: CT chest 05/16/2015. CLINICAL HISTORY: weakness FINDINGS: LUNGS AND PLEURA: Shallow inspiration. No focal pulmonary opacity. No pleural effusion. No pneumothorax. HEART AND MEDIASTINUM: Cardiac enlargement. Dilated, calcified, and tortuous aorta. BONES AND SOFT TISSUES: Degenerative changes in the spine and shoulders. No acute osseous  abnormality. IMPRESSION: 1. No acute cardiopulmonary process identified. 2. Cardiomegaly with a dilated, calcified, and tortuous aorta. Electronically signed by: Elsie Gravely MD 05/21/2024 02:29 PM EST RP Workstation: [id number]   Assessment/Plan Brittany [name] is a 89 y.o. female retired Brittany [name] with medical history significant for borderline diabetes, hypertension only on atenolol  being admitted to the hospital with symptomatic normocytic anemia.   Symptomatic anemia-unclear etiology, is normocytic.  Potentially could be slow GI blood loss, stool occult blood negative. -Observation admission -Monitor on telemetry -Follow-up anemia panel -Transfuse 2 unit PRBC and monitor posttransfusion hemoglobin -Patient states that she would be amenable to GI workup, GI consult requested -Liquid diet today, n.p.o. after midnight in anticipation of possible EGD  Hypertension-hold home atenolol   AKI superimposed on CKD stage IIIa-likely due to relative hypotension and severe anemia -Will hydrate gently, receiving blood as above and will trend renal function with daily labs  DVT prophylaxis: SCDs only  Code Status: Full Code  Consults called: [facility]  Admission status: Observation  Time spent: 59 minutes  Caddie Randle CHRISTELLA Gail MD [facility] Pager [phone]  If 7PM-7AM, please contact night-coverage [email or web address] Password [email or web address]  05/21/2024, 4:12 PM

## 2024-05-22 DIAGNOSIS — D649 Anemia, unspecified: Secondary | ICD-10-CM | POA: Diagnosis not present

## 2024-05-22 DIAGNOSIS — D509 Iron deficiency anemia, unspecified: Secondary | ICD-10-CM | POA: Diagnosis not present

## 2024-05-22 DIAGNOSIS — E538 Deficiency of other specified B group vitamins: Secondary | ICD-10-CM | POA: Diagnosis not present

## 2024-05-22 DIAGNOSIS — I10 Essential (primary) hypertension: Secondary | ICD-10-CM

## 2024-05-22 LAB — CBC
HCT: 21.7 % — ABNORMAL LOW (ref 36.0–46.0)
Hemoglobin: 6.6 g/dL — CL (ref 12.0–15.0)
MCH: 27.4 pg (ref 26.0–34.0)
MCHC: 30.4 g/dL (ref 30.0–36.0)
MCV: 90 fL (ref 80.0–100.0)
Platelets: 218 K/uL (ref 150–400)
RBC: 2.41 MIL/uL — ABNORMAL LOW (ref 3.87–5.11)
RDW: 15.9 % — ABNORMAL HIGH (ref 11.5–15.5)
WBC: 11.2 K/uL — ABNORMAL HIGH (ref 4.0–10.5)
nRBC: 1.5 % — ABNORMAL HIGH (ref 0.0–0.2)

## 2024-05-22 LAB — PREPARE RBC (CROSSMATCH)

## 2024-05-22 LAB — BASIC METABOLIC PANEL WITH GFR
Anion gap: 13 (ref 5–15)
BUN: 20 mg/dL (ref 8–23)
CO2: 17 mmol/L — ABNORMAL LOW (ref 22–32)
Calcium: 8 mg/dL — ABNORMAL LOW (ref 8.9–10.3)
Chloride: 113 mmol/L — ABNORMAL HIGH (ref 98–111)
Creatinine, Ser: 1.43 mg/dL — ABNORMAL HIGH (ref 0.44–1.00)
GFR, Estimated: 34 mL/min — ABNORMAL LOW (ref >60)
Glucose, Bld: 117 mg/dL — ABNORMAL HIGH (ref 70–99)
Potassium: 4.1 mmol/L (ref 3.5–5.1)
Sodium: 142 mmol/L (ref 135–145)

## 2024-05-22 MED ORDER — NA SULFATE-K SULFATE-MG SULF 17.5-3.13-1.6 GM/177ML PO SOLN
0.5000 | Freq: Once | ORAL | Status: AC
  Administered 2024-05-22: 177 mL via ORAL
  Filled 2024-05-22 (×2): qty 1

## 2024-05-22 MED ORDER — TAMSULOSIN HCL 0.4 MG PO CAPS
0.4000 mg | ORAL_CAPSULE | Freq: Every day | ORAL | Status: DC
  Administered 2024-05-22 – 2024-05-29 (×7): 0.4 mg via ORAL
  Filled 2024-05-22 (×8): qty 1

## 2024-05-22 MED ORDER — ATENOLOL 25 MG PO TABS
25.0000 mg | ORAL_TABLET | Freq: Every day | ORAL | Status: DC
  Administered 2024-05-22 – 2024-05-29 (×8): 25 mg via ORAL
  Filled 2024-05-22 (×9): qty 1

## 2024-05-22 MED ORDER — NA SULFATE-K SULFATE-MG SULF 17.5-3.13-1.6 GM/177ML PO SOLN
0.5000 | Freq: Once | ORAL | Status: AC
  Administered 2024-05-22: 177 mL via ORAL
  Filled 2024-05-22: qty 1

## 2024-05-22 MED ORDER — SODIUM CHLORIDE 0.9% IV SOLUTION: Freq: Once | INTRAVENOUS | Status: AC

## 2024-05-22 MED ORDER — POLYETHYLENE GLYCOL 3350 17 GM/SCOOP PO POWD
119.0000 g | Freq: Once | ORAL | Status: AC
  Administered 2024-05-22: 119 g via ORAL
  Filled 2024-05-22: qty 119

## 2024-05-22 MED ORDER — CYANOCOBALAMIN 1000 MCG/ML IJ SOLN
1000.0000 ug | Freq: Every day | INTRAMUSCULAR | Status: AC
  Administered 2024-05-22 – 2024-05-28 (×7): 1000 ug via INTRAMUSCULAR
  Filled 2024-05-22 (×9): qty 1

## 2024-05-22 MED ORDER — SODIUM CHLORIDE 0.9 % IV SOLN
INTRAVENOUS | Status: AC
  Administered 2024-05-23: 20 mL/h via INTRAVENOUS

## 2024-05-22 NOTE — Plan of Care (Signed)
  Problem: Clinical Measurements: Goal: Ability to maintain clinical measurements within normal limits will improve Outcome: Progressing   Problem: Clinical Measurements: Goal: Will remain free from infection Outcome: Progressing   Problem: Elimination: Goal: Will not experience complications related to bowel motility Outcome: Progressing   Problem: Elimination: Goal: Will not experience complications related to urinary retention Outcome: Progressing   Problem: Pain Managment: Goal: General experience of comfort will improve and/or be controlled Outcome: Progressing   Problem: Safety: Goal: Ability to remain free from injury will improve Outcome: Progressing

## 2024-05-22 NOTE — Progress Notes (Signed)
 [facility]                                                                              Brittany [name], is a 89 y.o. female, DOB - 07-08-1928, FMW:[id number] Admit date - 05/21/2024    Outpatient Primary MD for the patient is [name], MD  LOS - 0  days  Chief Complaint  Patient presents with   Fatigue       Brief summary   Patient is a 89 y.o. female retired [name] with borderline diabetes, HTN only on atenolol  was apparently brought to the ER by family due to lethargy, fatigue, lack of appetite.  Workup showed hemoglobin 4.0, stool Hemoccult exam is negative.  Patient denied any evidence of abdominal pain, nausea, hematochezia, melena or blood per rectum.  She has had colonoscopies many years ago and does not recall them ever being abnormal.  She denied any personal history of cancer, or GI bleeding.    Assessment & Plan       Symptomatic iron  deficiency anemia - Anemia profile showed iron  less than 10, TIBC 283, ferritin 43, B12 240, folate > 20 - Hb 4.0 on admission, s/p 2 units->  6.6 units, transfuse another packed RBC unit  - FOBT neg, GI consulted, awaiting recommendations     Essential hypertension, benign - BP stable, continue atenolol , decrease dose to 25 mg daily    B12 deficiency - B12 240, borderline started replacement, IM until discharge, then will change to p.o.  Borderline diabetes mellitus Hemoglobin A1c 6.2 on 12/18/2020, will recheck Not on any oral hypoglycemics  Obesity class 1  Estimated body mass index is 34.75 kg/m as calculated from the following:   Height as of this encounter: 5' (1.524 m).   Weight as of this encounter: 80.7 kg.  Code Status: Full code DVT Prophylaxis:  SCDs Start: 05/21/24 1553   Level of Care: Level of care: Progressive Family Communication: Updated patient Disposition Plan:      Remains inpatient appropriate: Awaiting GI evaluation   Procedures:    Consultants:    Gastroenterology  Antimicrobials:   Anti-infectives (From admission, onward)    None          Medications  sodium chloride    Intravenous Once   sodium chloride    Intravenous Once   atenolol   25 mg Oral Daily   cyanocobalamin   1,000 mcg Intramuscular Daily   Influenza vac split trivalent PF  0.5 mL Intramuscular Tomorrow-1000      Subjective:   Brittany [name] was seen and examined today.  Pleasant, no acute issues, was napping before the encounter.  Denies any nausea vomiting, chest pain, shortness of breath, abdominal pain or any ongoing bleeding.   Objective:   Vitals:   05/21/24 2241 05/21/24 2311 05/22/24 0132 05/22/24 0458  BP: 125/72 113/71 124/69 131/71  Pulse: 71 75 70 76  Resp: (!) 22 (!) 21 18 15   Temp: 98.6 F (37 C) 98.4 F (36.9 C) 98.6 F (37 C) 98.9 F (37.2 C)  TempSrc: Oral Oral Oral Oral  SpO2: 100% 100% 100% 98%  Weight:      Height:  Intake/Output Summary (Last 24 hours) at 05/22/2024 0643 Last data filed at 05/22/2024 0544 Gross per 24 hour  Intake 2110.62 ml  Output 900 ml  Net 1210.62 ml     Wt Readings from Last 3 Encounters:  05/21/24 80.7 kg  03/20/22 78.9 kg  04/21/21 79 kg     Exam General: Alert and oriented x 3, NAD Cardiovascular: S1 S2 auscultated,  RRR Respiratory: Clear to auscultation bilaterally, no wheezing Gastrointestinal: Soft, nontender, nondistended, + bowel sounds Ext: no pedal edema bilaterally Neuro: No new deficits Psych: Normal affect     Data Reviewed:  I have personally reviewed following labs    CBC Lab Results  Component Value Date   WBC 11.2 (H) 05/22/2024   RBC 2.41 (L) 05/22/2024   HGB 6.6 (LL) 05/22/2024   HCT 21.7 (L) 05/22/2024   MCV 90.0 05/22/2024   MCH 27.4 05/22/2024   PLT 218 05/22/2024   MCHC 30.4 05/22/2024   RDW 15.9 (H) 05/22/2024   LYMPHSABS 1.5 05/21/2024   MONOABS 0.9 05/21/2024   EOSABS 0.0 05/21/2024   BASOSABS 0.0 05/21/2024     Last metabolic  panel Lab Results  Component Value Date   NA 142 05/22/2024   K 4.1 05/22/2024   CL 113 (H) 05/22/2024   CO2 17 (L) 05/22/2024   BUN 20 05/22/2024   CREATININE 1.43 (H) 05/22/2024   GLUCOSE 117 (H) 05/22/2024   GFRNONAA 34 (L) 05/22/2024   GFRAA 56 (L) 09/06/2018   CALCIUM  8.0 (L) 05/22/2024   PROT 6.8 05/21/2024   ALBUMIN 3.5 05/21/2024   BILITOT 0.3 05/21/2024   ALKPHOS 69 05/21/2024   AST 24 05/21/2024   ALT 9 05/21/2024   ANIONGAP 13 05/22/2024    CBG (last 3)  No results for input(s): GLUCAP in the last 72 hours.    Coagulation Profile: No results for input(s): INR, PROTIME in the last 168 hours.   Radiology Studies: I have personally reviewed the imaging studies  CT HEAD WO CONTRAST Result Date: 05/21/2024 CLINICAL DATA:  Altered level of consciousness EXAM: CT HEAD WITHOUT CONTRAST TECHNIQUE: Contiguous axial images were obtained from the base of the skull through the vertex without intravenous contrast. RADIATION DOSE REDUCTION: This exam was performed according to the departmental dose-optimization program which includes automated exposure control, adjustment of the mA and/or kV according to patient size and/or use of iterative reconstruction technique. COMPARISON:  None Available. FINDINGS: Brain: Scattered hypodensities throughout the periventricular white matter consistent with chronic small vessel ischemic changes. No evidence of acute infarct or hemorrhage. The lateral ventricles and midline structures appear unremarkable. No acute extra-axial fluid collections. No mass effect. Vascular: No hyperdense vessel or unexpected calcification. Diffuse atherosclerosis. Skull: Normal. Negative for fracture or focal lesion. Sinuses/Orbits: No acute finding. Other: None. IMPRESSION: 1. No acute intracranial process. Electronically Signed   By: Ozell Daring M.D.   On: 05/21/2024 15:42   DG Chest Port 1 View Result Date: 05/21/2024 EXAM: 1 VIEW(S) XRAY OF THE CHEST  05/21/2024 02:24:00 PM COMPARISON: CT chest 05/16/2015. CLINICAL HISTORY: weakness FINDINGS: LUNGS AND PLEURA: Shallow inspiration. No focal pulmonary opacity. No pleural effusion. No pneumothorax. HEART AND MEDIASTINUM: Cardiac enlargement. Dilated, calcified, and tortuous aorta. BONES AND SOFT TISSUES: Degenerative changes in the spine and shoulders. No acute osseous abnormality. IMPRESSION: 1. No acute cardiopulmonary process identified. 2. Cardiomegaly with a dilated, calcified, and tortuous aorta. Electronically signed by: Elsie Gravely MD 05/21/2024 02:29 PM EST RP Workstation: [id number]  Nydia Distance M.D. [facility] 05/22/2024, 6:43 AM  Available via Epic secure chat 7am-7pm After 7 pm, please refer to night coverage provider listed on amion.

## 2024-05-22 NOTE — Progress Notes (Signed)
 Chaplains received a consult to assist [facility] with advance directives.  She was asleep at time of visit.  I spoke with her nurse who shared that it was her son who had requested the paperwork.  I left paperwork at bedside.  If additional needs arise or if Brittany [name] or her family have any questions please page or consult us  again.

## 2024-05-22 NOTE — Consult Note (Addendum)
 Consultation  Referring Provider: Dr. Davia    Primary Care Physician:  [name], MD Primary Gastroenterologist: Dr. Cloretta       Reason for Consultation: Anemia             HPI:   Brittany [name] is a 89 y.o. female with a past medical history as listed below including breast cancer, diabetes, hypertension (08/25/2018 echo with LVEF 55-60%), osteoporosis and multiple others, who presents to the hospital today with fatigue found to have an anemia.    At time of presentation patient described that she was a retired Brittany [name], admitted with symptomatic normocytic anemia.  Presented to the ER with lethargy, fatigue and a lack of appetite.  Stool Hemoccult was negative.    Today, at time of my interview it is obvious that the patient is somewhat altered.  She laughs continually during my interview and asked questions that are not pertinent.  She is happy though.  Tells me that she is doing well for her 89 year old with only 1 medicine outpatient.  When we discussed EGD and colonoscopy she tells me she will have to think about it and may just want to be observed going forward.  We discussed bowel prep which she is not a big fan of.  She denies any acute GI complaints or concerns and has not seen any blood or black stool.    Denies fever, chills, weight loss or any other symptoms.  ER course: CT of the head with no acute intracranial process, chest x-ray with cardiomegaly with a dilated, calcified and tortuous aorta, hemoglobin 4.0 (13.4 on 09/05/2020)--> 2 units PRBCs 6.6.  BMP with a glucose of 124, creatinine 1.68, BUN normal.  GI history: 01/17/2002 colonoscopy with diverticulosis, internal and external hemorrhoids as well as cecum to ascending colon bleeding present, friable with streaking hemorrhage.  Recommendations for repeat colonoscopy every 5 years.  Biopsy showed benign colonic mucosa with benign adipose tissue.  Past Medical History:  Diagnosis Date   Cancer [facility])    right breast    Diabetes mellitus    type 2. 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   Diverticulosis of colon    DJD (degenerative joint disease) of knee    Left   Hyperlipidemia    Hypertension    Osteoporosis    Personal history of radiation therapy    Positive PPD    history off    Past Surgical History:  Procedure Laterality Date   BREAST LUMPECTOMY Right 2010   BREAST SURGERY  2010   + RT    Family History  Problem Relation Age of Onset   Heart disease Mother    Diabetes Father    Diabetes Other        1st degree relative   Hypertension Other    Stroke Other    Cancer Other        cervical   Lupus Other    Coronary artery disease Other     Social History   Tobacco Use   Smoking status: Never   Smokeless tobacco: Never  Vaping Use   Vaping status: Never Used  Substance Use Topics   Alcohol use: No   Drug use: No    Prior to Admission medications   Medication Sig Start Date End Date Taking? Authorizing Provider  atenolol  (TENORMIN ) 50 MG tablet Take 1 tablet (50 mg total) by mouth daily. 04/21/21   Rollene Almarie LABOR, MD    Current  Facility-Administered Medications  Medication Dose Route Frequency Provider Last Rate Last Admin   acetaminophen  (TYLENOL ) tablet 650 mg  650 mg Oral Q6H PRN Zella, Mir M, MD       Or   acetaminophen  (TYLENOL ) suppository 650 mg  650 mg Rectal Q6H PRN Zella, Mir M, MD       albuterol  (PROVENTIL ) (2.5 MG/3ML) 0.083% nebulizer solution 2.5 mg  2.5 mg Nebulization Q2H PRN Zella, Mir M, MD       atenolol  (TENORMIN ) tablet 25 mg  25 mg Oral Daily Rai, Ripudeep K, MD       cyanocobalamin  (VITAMIN B12) injection 1,000 mcg  1,000 mcg Intramuscular Daily Rai, Ripudeep K, MD       Influenza vac split trivalent PF (FLUZONE HIGH-DOSE) injection 0.5 mL  0.5 mL Intramuscular Tomorrow-1000 Zella, Mir M, MD       ondansetron  (ZOFRAN ) tablet 4 mg  4 mg Oral Q6H PRN Zella, Mir M, MD       Or   ondansetron  (ZOFRAN )  injection 4 mg  4 mg Intravenous Q6H PRN Zella, Mir M, MD       traZODone  (DESYREL ) tablet 25 mg  25 mg Oral QHS PRN Zella Katha HERO, MD        Allergies as of 05/21/2024 - Review Complete 05/21/2024  Allergen Reaction Noted   Hctz [hydrochlorothiazide ] Other (See Comments) 01/22/2016   Amlodipine  besylate  01/26/2014   Chlorthalidone  Other (See Comments) 07/25/2014   Indapamide  Hives and Itching 11/01/2019   Olmesartan   07/13/2013   Alendronate  sodium  10/17/2009   Lisinopril  05/05/2007   Spironolactone  Itching 01/06/2019   Bystolic  [nebivolol  hcl] Itching and Rash 08/25/2018   Valsartan   06/12/2020     Review of Systems(per pt-though altered):    Constitutional: No weight loss, fever or chills Skin: No rash Cardiovascular: No chest pain Respiratory: No SOB  Gastrointestinal: See HPI and otherwise negative Genitourinary: No dysuria  Neurological: No headache, dizziness or syncope Musculoskeletal: No new muscle or joint pain Hematologic: No bleeding  Psychiatric: No history of depression or anxiety    Physical Exam:  Vital signs in last 24 hours: Temp:  [97.5 F (36.4 C)-99.3 F (37.4 C)] 98.5 F (36.9 C) (11/24 0800) Pulse Rate:  [68-83] 70 (11/24 0800) Resp:  [15-22] 16 (11/24 0800) BP: (95-131)/(49-78) 126/73 (11/24 0800) SpO2:  [91 %-100 %] 99 % (11/24 0800) Weight:  [80.7 kg] 80.7 kg (11/23 1748) Last BM Date : 05/21/24 General:   Pleasant AA female appears to be in NAD, Well developed, Well nourished, alert and cooperative Head:  Normocephalic and atraumatic. Eyes:   PEERL, EOMI. No icterus. Conjunctiva pink. Ears:  Normal auditory acuity. Neck:  Supple Throat: Oral cavity and pharynx without inflammation, swelling or lesion.  Lungs: Respirations even and unlabored. Lungs clear to auscultation bilaterally.   No wheezes, crackles, or rhonchi.  Heart: Normal S1, S2. No MRG. Regular rate and rhythm. No peripheral edema, cyanosis or pallor.  Abdomen:   Soft, nondistended, nontender. No rebound or guarding. Normal bowel sounds. No appreciable masses or hepatomegaly. Rectal:  Not performed.  Msk:  Symmetrical without gross deformities. Peripheral pulses intact.  Extremities:  Without edema, no deformity or joint abnormality.  Neurologic:  Alert and  oriented x2 person and place;  grossly normal neurologically.  Skin:   Dry and intact without significant lesions or rashes. Psychiatric: Laughs multiple times during my interview, asking questions that are not pertinent and answers questions which were not asked  LAB RESULTS: Recent Labs    05/21/24 1419 05/22/24 0503  WBC 9.9 11.2*  HGB 4.0* 6.6*  HCT 14.3* 21.7*  PLT 286 218   BMET Recent Labs    05/21/24 1419 05/22/24 0503  NA 142 142  K 4.3 4.1  CL 110 113*  CO2 17* 17*  GLUCOSE 124* 117*  BUN 19 20  CREATININE 1.68* 1.43*  CALCIUM  8.2* 8.0*   LFT Recent Labs    05/21/24 1419  PROT 6.8  ALBUMIN 3.5  AST 24  ALT 9  ALKPHOS 69  BILITOT 0.3    STUDIES: CT HEAD WO CONTRAST Result Date: 05/21/2024 CLINICAL DATA:  Altered level of consciousness EXAM: CT HEAD WITHOUT CONTRAST TECHNIQUE: Contiguous axial images were obtained from the base of the skull through the vertex without intravenous contrast. RADIATION DOSE REDUCTION: This exam was performed according to the departmental dose-optimization program which includes automated exposure control, adjustment of the mA and/or kV according to patient size and/or use of iterative reconstruction technique. COMPARISON:  None Available. FINDINGS: Brain: Scattered hypodensities throughout the periventricular white matter consistent with chronic small vessel ischemic changes. No evidence of acute infarct or hemorrhage. The lateral ventricles and midline structures appear unremarkable. No acute extra-axial fluid collections. No mass effect. Vascular: No hyperdense vessel or unexpected calcification. Diffuse atherosclerosis. Skull:  Normal. Negative for fracture or focal lesion. Sinuses/Orbits: No acute finding. Other: None. IMPRESSION: 1. No acute intracranial process. Electronically Signed   By: Ozell Daring M.D.   On: 05/21/2024 15:42   DG Chest Port 1 View Result Date: 05/21/2024 EXAM: 1 VIEW(S) XRAY OF THE CHEST 05/21/2024 02:24:00 PM COMPARISON: CT chest 05/16/2015. CLINICAL HISTORY: weakness FINDINGS: LUNGS AND PLEURA: Shallow inspiration. No focal pulmonary opacity. No pleural effusion. No pneumothorax. HEART AND MEDIASTINUM: Cardiac enlargement. Dilated, calcified, and tortuous aorta. BONES AND SOFT TISSUES: Degenerative changes in the spine and shoulders. No acute osseous abnormality. IMPRESSION: 1. No acute cardiopulmonary process identified. 2. Cardiomegaly with a dilated, calcified, and tortuous aorta. Electronically signed by: Elsie Gravely MD 05/21/2024 02:29 PM EST RP Workstation: [id number]     Impression / Plan:   Impression: 1.  Symptomatic anemia: Severely iron  deficient with an iron  less than 10, ferritin normal, presented with a hemoglobin of 4--> 2 units PRBCs 6.6--> receiving another unit of PRBCs today, Hemoccult negative, BUN normal, last colonoscopy in 2003 with some bleeding of the colon but biopsies normal; consider GI source versus other 2.  AKI superimposed on CKD stage III: Thought likely related to hypotension and severe anemia 3.  Altered mental status: Patient is obviously altered, not sure she fully understands the meaning of EGD and colonoscopy, would need consent from family members  Plan: 1.  Attempted to call patient's son Brittany [name] who is listed as her emergency contact.  He did not answer his phone.  Per nursing staff he has not answered his phone at all through their varied attempts.  Did leave a message for him to let us  know when he is available.  Apparently he was here yesterday and signed consent for her blood.  We would need to get his consent for EGD or colonoscopy as  patient is altered and tells me she just wants to think about it. 2.  Continue to monitor hemoglobin and transfusion as needed less than 7 3.  Would recommend EGD and colonoscopy given profound iron  deficiency if patient and family are willing to proceed. 4.  Can continue other supportive measures. 5.  Will allow  patient to eat today and clear liquids after midnight just in case we try for procedures on Wednesday.  Thank you for your kind consultation, we will continue to follow.  Delon Gibson [facility]  05/22/2024, 8:35 AM   Addendum:  05/18/2024 11:32 AM  Notified by the nursing staff the patient's son would like to proceed and discussed with his mother about having EGD and colonoscopy.  Will start a bowel prep this afternoon in split dose fashion, patient will be on a clear liquid diet today and n.p.o. at midnight.  Will set her up for tomorrow afternoon with Dr. Leigh.  Delon Failing, PA-C

## 2024-05-22 NOTE — Progress Notes (Signed)
 Date and time results received: 05/22/24 0601   Test: Hemoglobin Critical Value: 6.6  Name of Provider Notified: Lynwood Kipper, NP/ Laneta Boston, NP  Orders Received? Or Actions Taken?: 1 unit PRBC

## 2024-05-23 ENCOUNTER — Encounter (HOSPITAL_COMMUNITY): Payer: Self-pay | Admitting: Internal Medicine

## 2024-05-23 ENCOUNTER — Inpatient Hospital Stay (HOSPITAL_COMMUNITY): Admitting: Certified Registered Nurse Anesthetist

## 2024-05-23 ENCOUNTER — Inpatient Hospital Stay (HOSPITAL_COMMUNITY)

## 2024-05-23 ENCOUNTER — Encounter (HOSPITAL_COMMUNITY)
Admission: EM | Disposition: A | Discharge status: Skilled Nursing Facility | Payer: Self-pay | Source: Home / Self Care | Attending: Internal Medicine

## 2024-05-23 DIAGNOSIS — D123 Benign neoplasm of transverse colon: Secondary | ICD-10-CM

## 2024-05-23 DIAGNOSIS — D509 Iron deficiency anemia, unspecified: Secondary | ICD-10-CM

## 2024-05-23 DIAGNOSIS — K3189 Other diseases of stomach and duodenum: Secondary | ICD-10-CM

## 2024-05-23 DIAGNOSIS — K319 Disease of stomach and duodenum, unspecified: Secondary | ICD-10-CM

## 2024-05-23 DIAGNOSIS — K562 Volvulus: Secondary | ICD-10-CM

## 2024-05-23 DIAGNOSIS — D649 Anemia, unspecified: Secondary | ICD-10-CM | POA: Diagnosis not present

## 2024-05-23 DIAGNOSIS — E538 Deficiency of other specified B group vitamins: Secondary | ICD-10-CM | POA: Diagnosis not present

## 2024-05-23 DIAGNOSIS — K5520 Angiodysplasia of colon without hemorrhage: Secondary | ICD-10-CM

## 2024-05-23 DIAGNOSIS — D508 Other iron deficiency anemias: Secondary | ICD-10-CM

## 2024-05-23 DIAGNOSIS — K5521 Angiodysplasia of colon with hemorrhage: Secondary | ICD-10-CM

## 2024-05-23 DIAGNOSIS — K648 Other hemorrhoids: Secondary | ICD-10-CM

## 2024-05-23 DIAGNOSIS — K31A15 Gastric intestinal metaplasia without dysplasia, involving multiple sites: Secondary | ICD-10-CM

## 2024-05-23 DIAGNOSIS — N1832 Chronic kidney disease, stage 3b: Secondary | ICD-10-CM

## 2024-05-23 DIAGNOSIS — D126 Benign neoplasm of colon, unspecified: Secondary | ICD-10-CM

## 2024-05-23 DIAGNOSIS — K449 Diaphragmatic hernia without obstruction or gangrene: Secondary | ICD-10-CM

## 2024-05-23 DIAGNOSIS — K5730 Diverticulosis of large intestine without perforation or abscess without bleeding: Secondary | ICD-10-CM | POA: Diagnosis not present

## 2024-05-23 DIAGNOSIS — K2940 Chronic atrophic gastritis without bleeding: Secondary | ICD-10-CM

## 2024-05-23 DIAGNOSIS — I10 Essential (primary) hypertension: Secondary | ICD-10-CM | POA: Diagnosis not present

## 2024-05-23 DIAGNOSIS — I129 Hypertensive chronic kidney disease with stage 1 through stage 4 chronic kidney disease, or unspecified chronic kidney disease: Secondary | ICD-10-CM

## 2024-05-23 DIAGNOSIS — K317 Polyp of stomach and duodenum: Secondary | ICD-10-CM

## 2024-05-23 HISTORY — PX: ESOPHAGOGASTRODUODENOSCOPY: SHX5428

## 2024-05-23 HISTORY — PX: COLONOSCOPY: SHX5424

## 2024-05-23 LAB — RENAL FUNCTION PANEL
Albumin: 3.3 g/dL — ABNORMAL LOW (ref 3.5–5.0)
Anion gap: 14 (ref 5–15)
BUN: 15 mg/dL (ref 8–23)
CO2: 17 mmol/L — ABNORMAL LOW (ref 22–32)
Calcium: 8.7 mg/dL — ABNORMAL LOW (ref 8.9–10.3)
Chloride: 118 mmol/L — ABNORMAL HIGH (ref 98–111)
Creatinine, Ser: 1.19 mg/dL — ABNORMAL HIGH (ref 0.44–1.00)
GFR, Estimated: 42 mL/min — ABNORMAL LOW (ref >60)
Glucose, Bld: 114 mg/dL — ABNORMAL HIGH (ref 70–99)
Phosphorus: 2.4 mg/dL — ABNORMAL LOW (ref 2.5–4.6)
Potassium: 3.7 mmol/L (ref 3.5–5.1)
Sodium: 149 mmol/L — ABNORMAL HIGH (ref 135–145)

## 2024-05-23 LAB — CBC
HCT: 26.6 % — ABNORMAL LOW (ref 36.0–46.0)
Hemoglobin: 8 g/dL — ABNORMAL LOW (ref 12.0–15.0)
MCH: 27.7 pg (ref 26.0–34.0)
MCHC: 30.1 g/dL (ref 30.0–36.0)
MCV: 92 fL (ref 80.0–100.0)
Platelets: 235 K/uL (ref 150–400)
RBC: 2.89 MIL/uL — ABNORMAL LOW (ref 3.87–5.11)
RDW: 16.2 % — ABNORMAL HIGH (ref 11.5–15.5)
WBC: 12.2 K/uL — ABNORMAL HIGH (ref 4.0–10.5)
nRBC: 0.8 % — ABNORMAL HIGH (ref 0.0–0.2)

## 2024-05-23 LAB — HEMOGLOBIN A1C
Hgb A1c MFr Bld: 4.9 % (ref 4.8–5.6)
Mean Plasma Glucose: 93.93 mg/dL

## 2024-05-23 SURGERY — EGD (ESOPHAGOGASTRODUODENOSCOPY)
Anesthesia: Monitor Anesthesia Care

## 2024-05-23 MED ORDER — DEXTROSE 5 % IV SOLN: INTRAVENOUS | Status: DC

## 2024-05-23 MED ORDER — SODIUM CHLORIDE (PF) 0.9 % IJ SOLN
INTRAMUSCULAR | Status: DC | PRN
  Administered 2024-05-23: 3 mL

## 2024-05-23 MED ORDER — LIDOCAINE HCL (PF) 2 % IJ SOLN
INTRAMUSCULAR | Status: DC | PRN
  Administered 2024-05-23: 60 mg via INTRADERMAL

## 2024-05-23 MED ORDER — ENSURE PLUS HIGH PROTEIN PO LIQD
237.0000 mL | Freq: Two times a day (BID) | ORAL | Status: DC
  Administered 2024-05-24 – 2024-05-25 (×4): 237 mL via ORAL

## 2024-05-23 MED ORDER — EPHEDRINE SULFATE (PRESSORS) 25 MG/5ML IV SOSY
PREFILLED_SYRINGE | INTRAVENOUS | Status: DC | PRN
  Administered 2024-05-23 (×3): 5 mg via INTRAVENOUS

## 2024-05-23 MED ORDER — PHENYLEPHRINE 80 MCG/ML (10ML) SYRINGE FOR IV PUSH (FOR BLOOD PRESSURE SUPPORT)
PREFILLED_SYRINGE | INTRAVENOUS | Status: DC | PRN
  Administered 2024-05-23 (×4): 80 ug via INTRAVENOUS
  Administered 2024-05-23: 160 ug via INTRAVENOUS
  Administered 2024-05-23: 80 ug via INTRAVENOUS
  Administered 2024-05-23: 160 ug via INTRAVENOUS

## 2024-05-23 MED ORDER — PHENYLEPHRINE HCL-NACL 20-0.9 MG/250ML-% IV SOLN
INTRAVENOUS | Status: DC | PRN
  Administered 2024-05-23: 60 ug/min via INTRAVENOUS

## 2024-05-23 MED ORDER — SPOT INK MARKER SYRINGE KIT
PACK | SUBMUCOSAL | Status: DC | PRN
  Administered 2024-05-23: 2 mL via SUBMUCOSAL

## 2024-05-23 MED ORDER — PROPOFOL 10 MG/ML IV BOLUS
INTRAVENOUS | Status: DC | PRN
  Administered 2024-05-23: 10 mg via INTRAVENOUS
  Administered 2024-05-23: 20 mg via INTRAVENOUS
  Administered 2024-05-23: 30 mg via INTRAVENOUS
  Administered 2024-05-23: 20 mg via INTRAVENOUS
  Administered 2024-05-23: 100 ug/kg/min via INTRAVENOUS

## 2024-05-23 MED ORDER — PROPOFOL 1000 MG/100ML IV EMUL
INTRAVENOUS | Status: AC
  Filled 2024-05-23: qty 100

## 2024-05-23 NOTE — Anesthesia Procedure Notes (Signed)
 Procedure Name: MAC Date/Time: 05/23/2024 12:42 PM  Performed by: Joshua Vernell BROCKS, CRNAPre-anesthesia Checklist: Patient identified, Emergency Drugs available, Suction available and Patient being monitored Patient Re-evaluated:Patient Re-evaluated prior to induction Oxygen Delivery Method: Circle system utilized and Simple face mask Preoxygenation: Pre-oxygenation with 100% oxygen Placement Confirmation: positive ETCO2 and breath sounds checked- equal and bilateral

## 2024-05-23 NOTE — Anesthesia Preprocedure Evaluation (Addendum)
 Anesthesia Evaluation  Patient identified by MRN, date of birth, ID band Patient confused    Reviewed: Allergy & Precautions, NPO status , Patient's Chart, lab work & pertinent test results, reviewed documented beta blocker date and time   History of Anesthesia Complications Negative for: history of anesthetic complications  Airway Mallampati: Unable to assess       Dental  (+) Edentulous Upper, Edentulous Lower, Dental Advisory Given   Pulmonary    breath sounds clear to auscultation       Cardiovascular hypertension, Pt. on home beta blockers and Pt. on medications  Rhythm:Regular Rate:Normal  TTE:  1. The left ventricle has normal systolic function, with an ejection  fraction of 55-60%. The cavity size was normal. Mild hypertrophy of the  basal septum.. Left ventricular diastolic Doppler parameters are  consistent with impaired relaxation. Elevated  left ventricular end-diastolic pressure.   2. The right ventricle has normal systolic function. The cavity was  normal. There is no increase in right ventricular wall thickness.   3. There is mild mitral annular calcification present.   4. Mild thickening of the aortic valve Mild calcification of the aortic  valve.     Neuro/Psych    GI/Hepatic   Endo/Other  diabetes    Renal/GU      Musculoskeletal  (+) Arthritis ,  Osteoporosis   Abdominal   Peds  Hematology  (+) Blood dyscrasia, anemia   Anesthesia Other Findings   Reproductive/Obstetrics Hx of Breast Cancer                              Anesthesia Physical Anesthesia Plan  ASA: 3  Anesthesia Plan: MAC   Post-op Pain Management:    Induction: Intravenous  PONV Risk Score and Plan: 2 and Treatment may vary due to age or medical condition  Airway Management Planned: Natural Airway and Nasal Cannula  Additional Equipment:   Intra-op Plan:   Post-operative Plan:    Informed Consent:      Dental advisory given  Plan Discussed with: CRNA  Anesthesia Plan Comments: (Multiple attempts to contact patient's son, Ubaldo, to obtain consent. )        Anesthesia Quick Evaluation

## 2024-05-23 NOTE — Plan of Care (Signed)
  Problem: Clinical Measurements: Goal: Ability to maintain clinical measurements within normal limits will improve Outcome: Progressing   Problem: Clinical Measurements: Goal: Will remain free from infection Outcome: Progressing   Problem: Activity: Goal: Risk for activity intolerance will decrease Outcome: Progressing   Problem: Elimination: Goal: Will not experience complications related to bowel motility Outcome: Progressing   Problem: Elimination: Goal: Will not experience complications related to urinary retention Outcome: Progressing   Problem: Pain Managment: Goal: General experience of comfort will improve and/or be controlled Outcome: Progressing   Problem: Safety: Goal: Ability to remain free from injury will improve Outcome: Progressing   Problem: Skin Integrity: Goal: Risk for impaired skin integrity will decrease Outcome: Progressing

## 2024-05-23 NOTE — Anesthesia Postprocedure Evaluation (Signed)
 Anesthesia Post Note  [name]  Procedure(s) Performed: EGD (ESOPHAGOGASTRODUODENOSCOPY) COLONOSCOPY     Patient location during evaluation: Endoscopy Anesthesia Type: MAC Level of consciousness: awake Pain management: pain level controlled Vital Signs Assessment: post-procedure vital signs reviewed and stable Respiratory status: spontaneous breathing Cardiovascular status: blood pressure returned to baseline Postop Assessment: no apparent nausea or vomiting Anesthetic complications: no   No notable events documented.               Lauraine KATHEE [place]

## 2024-05-23 NOTE — Op Note (Signed)
 [facility] Patient Name: Brittany [name] Procedure Date: 05/23/2024 MRN: [id number] Attending MD: Elspeth SQUIBB. Leigh , MD, [id number] Date of Birth: [birth year] CSN: [id number] Age: 89 Admit Type: Inpatient Procedure:                Colonoscopy Indications:              Iron  deficiency anemia - last colonoscopy 2003 with                            reported hemorrhagic mucosa in the right colon. Providers:                Elspeth SQUIBB. Leigh, MD, Almarie Masters, RN, Felice Sar, Technician Referring MD:              Medicines:                Monitored Anesthesia Care Complications:            No immediate complications. Estimated blood loss:                            Minimal. Estimated Blood Loss:     Estimated blood loss was minimal. Procedure:                Pre-Anesthesia Assessment:                           - Prior to the procedure, a History and Physical                            was performed, and patient medications and                            allergies were reviewed. The patient's tolerance of                            previous anesthesia was also reviewed. The risks                            and benefits of the procedure and the sedation                            options and risks were discussed with the patient.                            All questions were answered, and informed consent                            was obtained. Prior Anticoagulants: The patient has                            taken no anticoagulant or antiplatelet agents. ASA  Grade Assessment: III - A patient with severe                            systemic disease. After reviewing the risks and                            benefits, the patient was deemed in satisfactory                            condition to undergo the procedure.                           After obtaining informed consent, the colonoscope                             was passed under direct vision. Throughout the                            procedure, the patient's blood pressure, pulse, and                            oxygen saturations were monitored continuously. The                            [id number]-[id number] ([id number]) Olympus Colonoscope was                            introduced through the anus and advanced to the the                            cecum, identified by appendiceal orifice and                            ileocecal valve. The colonoscopy was technically                            difficult and complex due to restricted mobility of                            the colon. The patient tolerated the procedure                            well. The quality of the bowel preparation was                            adequate. The ileocecal valve, appendiceal orifice,                            and rectum were photographed. Scope In: 1:03:35 PM Scope Out: 2:00:36 PM Scope Withdrawal Time: 0 hours 43 minutes 4 seconds  Total Procedure Duration: 0 hours 57 minutes 1 second  Findings:      The perianal and digital rectal examinations were normal.      Multiple small vascular lesions with oozing were found in  the ascending       colon. This could have been due to barotrauma from air insufflation,       however similar findings on the last colonoscopy in 2003. Fulguration to       ablate the lesions via APC to prevent bleeding was successful.      Many medium-mouthed diverticula were found in the entire colon. The left       colon was quite restricted and then with looping in the right colon.       Cecal intubation was challenging.      A 5 to 6 mm polyp was found in the hepatic flexure. The polyp was       sessile. It was not removed to avoid confounding the presentation with       removal, it is currently not causing any problems.      A large polyp was found in the splenic flexure - perhaps 4-5cm in size       or so. The polyp was semi-pedunculated and had  a lobulated head. There       was some adherent heme on the polyp on initial evaluation. Unclear if       findings in the right colon represented barotrauma or not, and possible       this large polyp could be related to iron  deficiency. The stalk / base       of the polyp was successfully injected with 3 mL of a 0.1 mg/mL solution       of epinephrine  for drug delivery and to lift it, and it lifted well. The       polyp was removed with a hot snare, it took a few passes to remove the       base of it in entirety. Resection and retrieval were complete. To       prevent bleeding after the polypectomy, five hemostatic clips were       successfully placed across the polypectomy site Area distal to the polyp       was tattooed with an injection of Spot (carbon black).      Internal hemorrhoids were found.      The exam was otherwise without abnormality. Impression:               - Multiple colonic vasular lesions in the right                            colon versus changes from barotrauma - although                            findings were present in 2003. Treated with argon                            plasma coagulation (APC).                           - Diverticulosis in the entire examined colon.                           - One 5 to 6 mm polyp at the hepatic flexure - not  removed.                           - One large polyp at the splenic flexure - possible                            it could be related to anemia, removed as outlined                            above. Clips were placed. Tattooed.                           - Internal hemorrhoids.                           - The examination was otherwise normal.                           Unclear what is driving IDA - unclear again if                            right colon changes were due to barotrauma or not,                            but treated. Large polyp removed from the splenic                             flexure, and gastric erythema / gastric atrophy                            also noted. Moderate Sedation:      No moderate sedation, case performed with MAC Recommendation:           - Return patient to hospital ward for ongoing care.                           - Advance diet as tolerated.                           - Continue present medications.                           - Await pathology results.                           - Trend Hgb, monitor for bleeding symptoms                           - GI service will reassess the patient tomorrow.                            Family will be contacted with the results. Procedure Code(s):        --- Professional ---  54617, 59, Colonoscopy, flexible; with control of                            bleeding, any method                           45385, Colonoscopy, flexible; with removal of                            tumor(s), polyp(s), or other lesion(s) by snare                            technique                           45381, 59, Colonoscopy, flexible; with directed                            submucosal injection(s), any substance Diagnosis Code(s):        --- Professional ---                           K55.20, Angiodysplasia of colon without hemorrhage                           K64.8, Other hemorrhoids                           D12.3, Benign neoplasm of transverse colon (hepatic                            flexure or splenic flexure)                           D50.9, Iron  deficiency anemia, unspecified                           K57.30, Diverticulosis of large intestine without                            perforation or abscess without bleeding CPT copyright 2022 [employer]. All rights reserved. The codes documented in this report are preliminary and upon coder review may  be revised to meet current compliance requirements. Elspeth P. Galo Sayed, MD 05/23/2024 2:15:17 PM This report has been signed  electronically. Number of Addenda: 0

## 2024-05-23 NOTE — TOC Initial Note (Signed)
 Transition of Care [facility]) - Initial/Assessment Note    Patient Details  Name: Brittany [name] MRN: [id number] Date of Birth: Mar 24, 1929  [facility]) CM/SW Contact:    Bascom Service, RN Phone Number: 05/23/2024, 10:41 AM  Clinical Narrative: Spoke to [name](son) about d/c plans return home. Has own transport. For EGD today.                  Expected Discharge Plan: Home/Self Care Barriers to Discharge: Continued Medical Work up   Patient Goals and CMS Choice Patient states their goals for this hospitalization and ongoing recovery are:: Home CMS Medicare.gov Compare Post Acute Care list provided to:: Patient Represenative (must comment) ([name](Son)) Choice offered to / list presented to : Adult Children [place] ownership interest in [facility].provided to:: Adult Children    Expected Discharge Plan and Services   Discharge Planning Services: CM Consult   Living arrangements for the past 2 months: Single Family Home                                      Prior Living Arrangements/Services Living arrangements for the past 2 months: Single Family Home Lives with:: Adult Children              Current home services: DME (rw)    Activities of Daily Living   ADL Screening (condition at time of admission) Independently performs ADLs?: Yes (appropriate for developmental age) Is the patient deaf or have difficulty hearing?: Yes Does the patient have difficulty seeing, even when wearing glasses/contacts?: No Does the patient have difficulty concentrating, remembering, or making decisions?: Yes  Permission Sought/Granted                  Emotional Assessment              Admission diagnosis:  Symptomatic anemia [D64.9] Patient Active Problem List   Diagnosis Date Noted   B12 deficiency 05/22/2024   Symptomatic anemia 05/21/2024   Stage 3b chronic kidney disease (HCC) 09/06/2020   Urinary frequency 01/23/2019   Vitamin D   deficiency 04/15/2016   Anemia, iron  deficiency 04/15/2016   Middle insomnia 04/15/2016   Routine general medical examination at a health care facility 12/25/2013   Allergic rhinitis 04/15/2011   Type II diabetes mellitus with manifestations (HCC) 05/05/2007   Hyperlipidemia with target LDL less than 100 05/05/2007   Essential hypertension, benign 05/05/2007   OSTEOPOROSIS 05/05/2007   PCP:  [name], MD Pharmacy:   [facility] [id number] [place], [place] - [address] AT [address] [facility] [address] Phone: [phone] Fax: [phone]  [facility] - [place], [place] - [address] [place] [address] Phone: [phone] Fax: [phone]     Social Drivers of Health (SDOH) Social History: SDOH Screenings   Food Insecurity: No Food Insecurity (05/21/2024)  Housing: Unknown (05/21/2024)  Transportation Needs: No Transportation Needs (05/21/2024)  Utilities: Not At Risk (05/21/2024)  Alcohol Screen: Low Risk  (03/20/2022)  Depression (PHQ2-9): Low Risk  (03/20/2022)  Financial Resource Strain: Low Risk  (03/20/2022)  Physical Activity: Inactive (03/20/2022)  Social Connections: Socially Isolated (05/21/2024)  Stress: No Stress Concern Present (03/20/2022)  Tobacco Use: Low Risk  (05/21/2024)   SDOH Interventions:     Readmission Risk Interventions     No data to display

## 2024-05-23 NOTE — Op Note (Signed)
 [facility] Patient Name: Brittany [name] Procedure Date: 05/23/2024 MRN: [id number] Attending MD: Brittany [name] , MD, [id number] Date of Birth: Aug 02, 1928 CSN: [id number] Age: 89 Admit Type: Inpatient Procedure:                Upper GI endoscopy Indications:              Iron  deficiency anemia Providers:                Brittany P. Leigh, MD, Brittany Masters, RN, Brittany [name], Technician Referring MD:              Medicines:                Monitored Anesthesia Care Complications:            No immediate complications. Estimated blood loss:                            Minimal. Estimated Blood Loss:     Estimated blood loss was minimal. Procedure:                Pre-Anesthesia Assessment:                           - Prior to the procedure, a History and Physical                            was performed, and patient medications and                            allergies were reviewed. The patient's tolerance of                            previous anesthesia was also reviewed. The risks                            and benefits of the procedure and the sedation                            options and risks were discussed with the patient.                            All questions were answered, and informed consent                            was obtained. Prior Anticoagulants: The patient has                            taken no anticoagulant or antiplatelet agents. ASA                            Grade Assessment: III - A patient with severe  systemic disease. After reviewing the risks and                            benefits, the patient was deemed in satisfactory                            condition to undergo the procedure.                           After obtaining informed consent, the endoscope was                            passed under direct vision. Throughout the                            procedure, the patient's  blood pressure, pulse, and                            oxygen saturations were monitored continuously. The                            [id number]-[id number] ([id number] Olympus endoscope was introduced                            through the mouth, and advanced to the second part                            of duodenum. The upper GI endoscopy was                            accomplished without difficulty. The patient                            tolerated the procedure well. Scope In: Scope Out: Findings:      Esophagogastric landmarks were identified: the Z-line was found at 34       cm, the gastroesophageal junction was found at 34 cm and the upper       extent of the gastric folds was found at 36 cm from the incisors.      A 2 cm hiatal hernia was present.      The examined esophagus was tortuous.      The exam of the esophagus was otherwise normal.      Patchy mildly erythematous mucosa was found in the gastric fundus and in       the gastric body. No ulcerations / erosions noted.      Diffuse atrophic mucosa was found in the entire examined stomach.       Biopsies were taken with a cold forceps for Helicobacter pylori testing.      A few small sessile polyps were found in the gastric body and in the       gastric antrum. A representative polyp was removed with a cold biopsy       forceps, rule out adenoma. Resection and retrieval were complete.      The exam of the stomach was otherwise normal.      The examined duodenum  was normal. The duodenal sweep was angulated. Some       mild contact irritation from the endoscope noted during withdrawal in       this area (in case capsule endoscopy is pursued) Impression:               - Esophagogastric landmarks identified.                           - 2 cm hiatal hernia.                           - Tortuous esophagus.                           - Erythematous mucosa in the gastric fundus and                            gastric body.                           -  Gastric mucosal atrophy. Biopsied.                           - A few gastric polyps. Representative lesion                            resected and retrieved - rule out adenoma.                           - Normal examined duodenum. Moderate Sedation:      No moderate sedation, case performed with MAC Recommendation:           - Proceed with colonoscopy.                           - Full recommendations per colonoscopy note. Procedure Code(s):        --- Professional ---                           [phone], Esophagogastroduodenoscopy, flexible,                            transoral; with biopsy, single or multiple Diagnosis Code(s):        --- Professional ---                           K44.9, Diaphragmatic hernia without obstruction or                            gangrene                           Q39.9, Congenital malformation of esophagus,                            unspecified  K31.89, Other diseases of stomach and duodenum                           K31.7, Polyp of stomach and duodenum                           D50.9, Iron  deficiency anemia, unspecified CPT copyright 2022 American Medical Association. All rights reserved. The codes documented in this report are preliminary and upon coder review may  be revised to meet current compliance requirements. Brittany P. Harlon Kutner, MD 05/23/2024 1:00:35 PM This report has been signed electronically. Number of Addenda: 0

## 2024-05-23 NOTE — Progress Notes (Signed)
 [facility]                                                                              Brittany [name], is a 89 y.o. female, DOB - 1928/07/01, FMW:[id number] Admit date - 05/21/2024    Outpatient Primary MD for the patient is [name], MD  LOS - 1  days  Chief Complaint  Patient presents with   Fatigue       Brief summary   Patient is a 89 y.o. female retired [name] with borderline diabetes, HTN only on atenolol  was apparently brought to the ER by family due to lethargy, fatigue, lack of appetite.  Workup showed hemoglobin 4.0, stool Hemoccult exam is negative.  Patient denied any evidence of abdominal pain, nausea, hematochezia, melena or blood per rectum.  She has had colonoscopies many years ago and does not recall them ever being abnormal.  She denied any personal history of cancer, or GI bleeding.    Assessment & Plan       Symptomatic iron  deficiency anemia - Anemia profile showed iron  less than 10, TIBC 283, ferritin 43, B12 240, folate > 20 - Hb 4.0 on admission, s/p 3 units packed RBCs  - FOBT neg, GI consulted, awaiting recommendations - Hemoglobin 8.0, plan for endoscopy today - EGD showed erythematous mucosa in the gastric fundus and gastric body, 2 cm hiatal hernia, gastric mucosal atrophy, few gastric polyps, status post biopsy - Plan for colonoscopy    Essential hypertension, benign - BP stable, continue atenolol , decrease dose to 25 mg daily    B12 deficiency - B12 240, borderline started replacement, IM until discharge, then will change to p.o.  Acute urinary retention - Placed on Flomax  for 7 days, had I/O cath x 3, will place Foley catheter - CT abdomen ruled out any obstructive uropathy, hydronephrosis or any intra-abdominal mass or acute pathology.  Borderline diabetes mellitus Hemoglobin A1c 6.2 on 12/18/2020, will recheck Not on any oral hypoglycemics  Hypernatremia Sodium trending up to 149, placed on D5 drip, has been  n.p.o.  Obesity class 1  Estimated body mass index is 34.75 kg/m as calculated from the following:   Height as of this encounter: 5' (1.524 m).   Weight as of this encounter: 80.7 kg.  Code Status: Full code DVT Prophylaxis:  SCDs Start: 05/21/24 1553   Level of Care: Level of care: Progressive Family Communication: Updated patient Disposition Plan:      Remains inpatient appropriate: Awaiting GI evaluation   Procedures:  EGD  Consultants:   Gastroenterology  Antimicrobials:   Anti-infectives (From admission, onward)    None          Medications  [MAR Hold] atenolol   25 mg Oral Daily   [MAR Hold] cyanocobalamin   1,000 mcg Intramuscular Daily   Influenza vac split trivalent PF  0.5 mL Intramuscular Tomorrow-1000   [MAR Hold] tamsulosin   0.4 mg Oral Daily      Subjective:   Brittany [name] was seen and examined today.  Resting, appears comfortable.  RN, was alert and awake this morning, completed prep.  Objective:   Vitals:   05/22/24 1034  05/22/24 2132 05/23/24 0408 05/23/24 1124  BP: 127/75 (!) 153/89 (!) 140/94 (!) 157/96  Pulse: 71 73 72 72  Resp: 18 16 15    Temp: 98.3 F (36.8 C) 98.6 F (37 C) 97.8 F (36.6 C) 97.9 F (36.6 C)  TempSrc: Oral Oral  Temporal  SpO2: 99% 100% 100% 100%  Weight:    80.7 kg  Height:    5' (1.524 m)    Intake/Output Summary (Last 24 hours) at 05/23/2024 1338 Last data filed at 05/23/2024 9364 Gross per 24 hour  Intake 613.99 ml  Output 1050 ml  Net -436.01 ml     Wt Readings from Last 3 Encounters:  05/23/24 80.7 kg  03/20/22 78.9 kg  04/21/21 79 kg    Physical Exam General: Resting comfortably, NAD Cardiovascular: S1 S2 clear, RRR.  Respiratory: CTAB, no wheezing, rales or rhonchi Gastrointestinal: Soft, nontender, nondistended, NBS Ext: no pedal edema bilaterally Psych: Sleepy   Data Reviewed:  I have personally reviewed following labs    CBC Lab Results  Component Value Date   WBC 12.2 (H)  05/23/2024   RBC 2.89 (L) 05/23/2024   HGB 8.0 (L) 05/23/2024   HCT 26.6 (L) 05/23/2024   MCV 92.0 05/23/2024   MCH 27.7 05/23/2024   PLT 235 05/23/2024   MCHC 30.1 05/23/2024   RDW 16.2 (H) 05/23/2024   LYMPHSABS 1.5 05/21/2024   MONOABS 0.9 05/21/2024   EOSABS 0.0 05/21/2024   BASOSABS 0.0 05/21/2024     Last metabolic panel Lab Results  Component Value Date   NA 149 (H) 05/23/2024   K 3.7 05/23/2024   CL 118 (H) 05/23/2024   CO2 17 (L) 05/23/2024   BUN 15 05/23/2024   CREATININE 1.19 (H) 05/23/2024   GLUCOSE 114 (H) 05/23/2024   GFRNONAA 42 (L) 05/23/2024   GFRAA 56 (L) 09/06/2018   CALCIUM  8.7 (L) 05/23/2024   PHOS 2.4 (L) 05/23/2024   PROT 6.8 05/21/2024   ALBUMIN 3.3 (L) 05/23/2024   BILITOT 0.3 05/21/2024   ALKPHOS 69 05/21/2024   AST 24 05/21/2024   ALT 9 05/21/2024   ANIONGAP 14 05/23/2024    CBG (last 3)  No results for input(s): GLUCAP in the last 72 hours.    Coagulation Profile: No results for input(s): INR, PROTIME in the last 168 hours.   Radiology Studies: I have personally reviewed the imaging studies  CT ABDOMEN PELVIS WO CONTRAST Result Date: 05/23/2024 CLINICAL DATA:  Acute abdominal pain. Urinary retention. Severe anemia. EXAM: CT ABDOMEN AND PELVIS WITHOUT CONTRAST TECHNIQUE: Multidetector CT imaging of the abdomen and pelvis was performed following the standard protocol without IV contrast. RADIATION DOSE REDUCTION: This exam was performed according to the departmental dose-optimization program which includes automated exposure control, adjustment of the mA and/or kV according to patient size and/or use of iterative reconstruction technique. COMPARISON:  None Available. FINDINGS: Lower chest: No acute findings. Hepatobiliary: Multiple hepatic cysts are noted, largest in segment 3 of left lobe measuring 6 cm. No definite mass visualized on this unenhanced exam. Gallbladder is unremarkable. No evidence of biliary ductal dilatation.  Pancreas: No mass or inflammatory process visualized on this unenhanced exam. Spleen:  Within normal limits in size. Adrenals/Urinary tract: Small bilateral low-attenuation adrenal masses are seen, consistent with benign adenomas. A few fluid attenuation renal cysts are noted bilaterally. (No followup imaging is recommended). No evidence of urolithiasis or hydronephrosis. Unremarkable unopacified urinary bladder. Stomach/Bowel: Small hiatal hernia. No evidence of obstruction, inflammatory process, or abnormal fluid  collections. Extensive colonic diverticulosis without signs of diverticulitis. Vascular/Lymphatic: No pathologically enlarged lymph nodes identified. No evidence of abdominal aortic aneurysm. Reproductive:  No mass or other significant abnormality. Other:  Small fat-containing umbilical hernia. Musculoskeletal:  No suspicious bone lesions identified. IMPRESSION: No evidence of urolithiasis, hydronephrosis, or other acute findings. Extensive colonic diverticulosis, without radiographic evidence of diverticulitis. Small hiatal hernia.  Small fat-containing umbilical hernia. Electronically Signed   By: Norleen DELENA Kil M.D.   On: 05/23/2024 10:44   CT HEAD WO CONTRAST Result Date: 05/21/2024 CLINICAL DATA:  Altered level of consciousness EXAM: CT HEAD WITHOUT CONTRAST TECHNIQUE: Contiguous axial images were obtained from the base of the skull through the vertex without intravenous contrast. RADIATION DOSE REDUCTION: This exam was performed according to the departmental dose-optimization program which includes automated exposure control, adjustment of the mA and/or kV according to patient size and/or use of iterative reconstruction technique. COMPARISON:  None Available. FINDINGS: Brain: Scattered hypodensities throughout the periventricular white matter consistent with chronic small vessel ischemic changes. No evidence of acute infarct or hemorrhage. The lateral ventricles and midline structures appear  unremarkable. No acute extra-axial fluid collections. No mass effect. Vascular: No hyperdense vessel or unexpected calcification. Diffuse atherosclerosis. Skull: Normal. Negative for fracture or focal lesion. Sinuses/Orbits: No acute finding. Other: None. IMPRESSION: 1. No acute intracranial process. Electronically Signed   By: Ozell Daring M.D.   On: 05/21/2024 15:42   DG Chest Port 1 View Result Date: 05/21/2024 EXAM: 1 VIEW(S) XRAY OF THE CHEST 05/21/2024 02:24:00 PM COMPARISON: CT chest 05/16/2015. CLINICAL HISTORY: weakness FINDINGS: LUNGS AND PLEURA: Shallow inspiration. No focal pulmonary opacity. No pleural effusion. No pneumothorax. HEART AND MEDIASTINUM: Cardiac enlargement. Dilated, calcified, and tortuous aorta. BONES AND SOFT TISSUES: Degenerative changes in the spine and shoulders. No acute osseous abnormality. IMPRESSION: 1. No acute cardiopulmonary process identified. 2. Cardiomegaly with a dilated, calcified, and tortuous aorta. Electronically signed by: Elsie Gravely MD 05/21/2024 02:29 PM EST RP Workstation: [id number]       Nydia Distance M.D. [facility] 05/23/2024, 1:38 PM  Available via Epic secure chat 7am-7pm After 7 pm, please refer to night coverage provider listed on amion.

## 2024-05-23 NOTE — Progress Notes (Signed)
 Progress Note   Subjective  Chief Complaint: Anemia, iron  deficiency  Today, patient found asleep, she does not wake when I order her name or shake her slightly.  Discussed with nursing staff to tell me she did complete all of the bowel prep and in fact this morning had a clear bowel.  She did well with it.  No new complaints.  Scheduled for EGD and colonoscopy this afternoon.   Objective   Vital signs in last 24 hours: Temp:  [97.8 F (36.6 C)-98.6 F (37 C)] 97.8 F (36.6 C) (11/25 0408) Pulse Rate:  [71-73] 72 (11/25 0408) Resp:  [15-18] 15 (11/25 0408) BP: (127-153)/(75-94) 140/94 (11/25 0408) SpO2:  [99 %-100 %] 100 % (11/25 0408) Last BM Date : 05/23/24 General:    [name] in NAD Heart:  Regular rate and rhythm; no murmurs Lungs: Respirations even and unlabored, lungs CTA bilaterally Abdomen:  Soft, nontender and nondistended. Normal bowel sounds. Psych: Asleep  Intake/Output from previous day: 11/24 0701 - 11/25 0700 In: 948 [P.O.:575; I.V.:39; Blood:334] Out: 1050 [Urine:1050]   Lab Results: Recent Labs    05/21/24 1419 05/22/24 0503 05/23/24 0507  WBC 9.9 11.2* 12.2*  HGB 4.0* 6.6* 8.0*  HCT 14.3* 21.7* 26.6*  PLT 286 218 235   BMET Recent Labs    05/21/24 1419 05/22/24 0503 05/23/24 0507  NA 142 142 149*  K 4.3 4.1 3.7  CL 110 113* 118*  CO2 17* 17* 17*  GLUCOSE 124* 117* 114*  BUN 19 20 15   CREATININE 1.68* 1.43* 1.19*  CALCIUM  8.2* 8.0* 8.7*   LFT Recent Labs    05/21/24 1419 05/23/24 0507  PROT 6.8  --   ALBUMIN 3.5 3.3*  AST 24  --   ALT 9  --   ALKPHOS 69  --   BILITOT 0.3  --    Studies/Results: CT HEAD WO CONTRAST Result Date: 05/21/2024 CLINICAL DATA:  Altered level of consciousness EXAM: CT HEAD WITHOUT CONTRAST TECHNIQUE: Contiguous axial images were obtained from the base of the skull through the vertex without intravenous contrast. RADIATION DOSE REDUCTION: This exam was performed according to  the departmental dose-optimization program which includes automated exposure control, adjustment of the mA and/or kV according to patient size and/or use of iterative reconstruction technique. COMPARISON:  None Available. FINDINGS: Brain: Scattered hypodensities throughout the periventricular white matter consistent with chronic small vessel ischemic changes. No evidence of acute infarct or hemorrhage. The lateral ventricles and midline structures appear unremarkable. No acute extra-axial fluid collections. No mass effect. Vascular: No hyperdense vessel or unexpected calcification. Diffuse atherosclerosis. Skull: Normal. Negative for fracture or focal lesion. Sinuses/Orbits: No acute finding. Other: None. IMPRESSION: 1. No acute intracranial process. Electronically Signed   By: Ozell Daring M.D.   On: 05/21/2024 15:42   DG Chest Port 1 View Result Date: 05/21/2024 EXAM: 1 VIEW(S) XRAY OF THE CHEST 05/21/2024 02:24:00 PM COMPARISON: CT chest 05/16/2015. CLINICAL HISTORY: weakness FINDINGS: LUNGS AND PLEURA: Shallow inspiration. No focal pulmonary opacity. No pleural effusion. No pneumothorax. HEART AND MEDIASTINUM: Cardiac enlargement. Dilated, calcified, and tortuous aorta. BONES AND SOFT TISSUES: Degenerative changes in the spine and shoulders. No acute osseous abnormality. IMPRESSION: 1. No acute cardiopulmonary process identified. 2. Cardiomegaly with a dilated, calcified, and tortuous aorta. Electronically signed by: Elsie Gravely MD 05/21/2024 02:29 PM EST RP Workstation: [id number]    Assessment / Plan:   Assessment: 1.  Symptomatic anemia: Severely iron  deficient with an iron  less than 10,  ferritin normal, present with a hemoglobin of 4--> 2 units PRBCs--> 6.6--> 1 unit PRBCs--> 8.0, Hemoccult negative, BUN normal, last colonoscopy in 2003 with some bleeding of the colon but biopsies normal, scheduled for EGD and colon today 2.  AKI superimposed on CKD stage III: Likely due to hypotension and  severe anemia, improved 3.  Altered mental status  Plan: 1.  Patient scheduled for EGD and colonoscopy today.  Did complete bowel prep overnight. 2.  Please await further recommendations after time procedures.  Thank you for your kind consultation.    LOS: 1 day   Brittany [name]  05/23/2024, 10:01 AM

## 2024-05-23 NOTE — Transfer of Care (Signed)
 Immediate Anesthesia Transfer of Care Note  [name]  Procedure(s) Performed: Procedure(s): EGD (ESOPHAGOGASTRODUODENOSCOPY) (N/A) COLONOSCOPY (N/A)  Patient Location: PACU  Anesthesia Type:MAC  Level of Consciousness: Patient easily awoken, comfortable, cooperative, following commands, responds to stimulation.   Airway & Oxygen Therapy: Patient spontaneously breathing, ventilating well, oxygen via simple oxygen mask.  Post-op Assessment: Report given to PACU RN, vital signs reviewed and stable, moving all extremities.   Post vital signs: Reviewed and stable.  Complications: No apparent anesthesia complications Last Vitals:  Vitals Value Taken Time  BP 140/68 05/23/24 14:20  Temp 36.9 C 05/23/24 14:10  Pulse 82 05/23/24 14:20  Resp 22 05/23/24 14:21  SpO2 90 % 05/23/24 14:20  Vitals shown include unfiled device data.  Last Pain:  Vitals:   05/23/24 1410  TempSrc: Temporal  PainSc:       Patients Stated Pain Goal: 0 (05/23/24 0750)  Complications: No notable events documented.

## 2024-05-24 ENCOUNTER — Encounter (HOSPITAL_COMMUNITY): Payer: Self-pay | Admitting: Gastroenterology

## 2024-05-24 DIAGNOSIS — D500 Iron deficiency anemia secondary to blood loss (chronic): Secondary | ICD-10-CM | POA: Diagnosis not present

## 2024-05-24 DIAGNOSIS — K319 Disease of stomach and duodenum, unspecified: Secondary | ICD-10-CM

## 2024-05-24 DIAGNOSIS — B9681 Helicobacter pylori [H. pylori] as the cause of diseases classified elsewhere: Secondary | ICD-10-CM

## 2024-05-24 DIAGNOSIS — D649 Anemia, unspecified: Secondary | ICD-10-CM | POA: Diagnosis not present

## 2024-05-24 DIAGNOSIS — D509 Iron deficiency anemia, unspecified: Secondary | ICD-10-CM | POA: Diagnosis not present

## 2024-05-24 DIAGNOSIS — E538 Deficiency of other specified B group vitamins: Secondary | ICD-10-CM | POA: Diagnosis not present

## 2024-05-24 DIAGNOSIS — Z860100 Personal history of colon polyps, unspecified: Secondary | ICD-10-CM

## 2024-05-24 LAB — CBC
HCT: 25.4 % — ABNORMAL LOW (ref 36.0–46.0)
Hemoglobin: 7.7 g/dL — ABNORMAL LOW (ref 12.0–15.0)
MCH: 27.3 pg (ref 26.0–34.0)
MCHC: 30.3 g/dL (ref 30.0–36.0)
MCV: 90.1 fL (ref 80.0–100.0)
Platelets: 200 K/uL (ref 150–400)
RBC: 2.82 MIL/uL — ABNORMAL LOW (ref 3.87–5.11)
RDW: 16.2 % — ABNORMAL HIGH (ref 11.5–15.5)
WBC: 11.2 K/uL — ABNORMAL HIGH (ref 4.0–10.5)
nRBC: 1.1 % — ABNORMAL HIGH (ref 0.0–0.2)

## 2024-05-24 LAB — RENAL FUNCTION PANEL
Albumin: 3 g/dL — ABNORMAL LOW (ref 3.5–5.0)
Anion gap: 11 (ref 5–15)
BUN: 11 mg/dL (ref 8–23)
CO2: 20 mmol/L — ABNORMAL LOW (ref 22–32)
Calcium: 8.2 mg/dL — ABNORMAL LOW (ref 8.9–10.3)
Chloride: 113 mmol/L — ABNORMAL HIGH (ref 98–111)
Creatinine, Ser: 1.04 mg/dL — ABNORMAL HIGH (ref 0.44–1.00)
GFR, Estimated: 50 mL/min — ABNORMAL LOW (ref >60)
Glucose, Bld: 110 mg/dL — ABNORMAL HIGH (ref 70–99)
Phosphorus: 2.2 mg/dL — ABNORMAL LOW (ref 2.5–4.6)
Potassium: 3.3 mmol/L — ABNORMAL LOW (ref 3.5–5.1)
Sodium: 144 mmol/L (ref 135–145)

## 2024-05-24 LAB — SURGICAL PATHOLOGY

## 2024-05-24 LAB — PREPARE RBC (CROSSMATCH)

## 2024-05-24 MED ORDER — SODIUM CHLORIDE 0.9% IV SOLUTION: Freq: Once | INTRAVENOUS | Status: AC

## 2024-05-24 MED ORDER — PANTOPRAZOLE SODIUM 40 MG PO TBEC
40.0000 mg | DELAYED_RELEASE_TABLET | Freq: Every day | ORAL | Status: DC
  Administered 2024-05-24 – 2024-05-27 (×4): 40 mg via ORAL
  Filled 2024-05-24 (×4): qty 1

## 2024-05-24 MED ORDER — CHLORHEXIDINE GLUCONATE CLOTH 2 % EX PADS
6.0000 | MEDICATED_PAD | Freq: Every day | CUTANEOUS | Status: DC
  Administered 2024-05-24 – 2024-05-28 (×4): 6 via TOPICAL

## 2024-05-24 MED ORDER — SODIUM CHLORIDE 0.9 % IV SOLN
300.0000 mg | Freq: Once | INTRAVENOUS | Status: AC
  Administered 2024-05-24: 300 mg via INTRAVENOUS
  Filled 2024-05-24: qty 15

## 2024-05-24 MED ORDER — POTASSIUM CHLORIDE CRYS ER 20 MEQ PO TBCR
40.0000 meq | EXTENDED_RELEASE_TABLET | Freq: Once | ORAL | Status: AC
  Administered 2024-05-24: 40 meq via ORAL
  Filled 2024-05-24: qty 2

## 2024-05-24 NOTE — Evaluation (Addendum)
 Physical Therapy Evaluation Patient Details Name: Brittany [name] MRN: [id number] DOB: 1928-10-13 Today's Date: 05/24/2024  History of Present Illness  89 yo female admitted with anemia-s/p transfusion x 3 units, acute urinary retention. Hx of DM, breast Ca, oteoporosis, DJD, obesity  Clinical Impression  On eval, pt required Mod A +2 for mobility. She was able to stand and step over to recliner with use of RW. Ambulation deferred due pt report of feeling dizzy, ankle pain with WBing-BP ok 143/85. O2 96% on RA during session. No family present during session-unsure of pt's PLOF/cognitive baseline. Pt appears to have some confusion-had difficulty following commands during session. Recommend TOC consult-SNF vs HHPT, depending on family input/ability to provide current level of care.         If plan is discharge home, recommend the following: Two people to help with walking and/or transfers;A lot of help with bathing/dressing/bathroom;Assistance with cooking/housework;Assist for transportation;Help with stairs or ramp for entrance   Can travel by private vehicle        Equipment Recommendations  (RW if pt does not have one)  Recommendations for Other Services       Functional Status Assessment Patient has had a recent decline in their functional status and demonstrates the ability to make significant improvements in function in a reasonable and predictable amount of time.     Precautions / Restrictions Precautions Precautions: Fall Restrictions Weight Bearing Restrictions Per Provider Order: No      Mobility  Bed Mobility Overal bed mobility: Needs Assistance Bed Mobility: Supine to Sit     Supine to sit: Mod assist, +2 for physical assistance, +2 for safety/equipment, HOB elevated     General bed mobility comments: Repeated, multmodal cueing required. Assist for trunk and bil LEs. Utilized bedpad to aid with scooting to EOB. Pt c/o dizziness-BP, O2 ok.    Transfers Overall  transfer level: Needs assistance Equipment used: Rolling walker (2 wheels) Transfers: Sit to/from Stand, Bed to chair/wheelchair/BSC Sit to Stand: Mod assist, +2 physical assistance, +2 safety/equipment, From elevated surface   Step pivot transfers: Min assist, +2 physical assistance, +2 safety/equipment       General transfer comment: Assist to power up, stabilize, control descent. Cues for safety, technique, hand placement. Pt able to step over to recliner with use of RW. Fall risk.    Ambulation/Gait               General Gait Details: Deferred for safety (pt weakness, reports of ankle pain)  Stairs            Wheelchair Mobility     Tilt Bed    Modified Rankin (Stroke Patients Only)       Balance Overall balance assessment: Needs assistance         Standing balance support: Bilateral upper extremity supported, During functional activity, Reliant on assistive device for balance Standing balance-Leahy Scale: Poor                               Pertinent Vitals/Pain Pain Assessment Pain Assessment: Faces Pain Location: pt reports ankle pain with WBing Pain Intervention(s): Limited activity within patient's tolerance, Monitored during session, Repositioned    Home Living Family/patient expects to be discharged to:: Private residence Living Arrangements: Children Available Help at Discharge: Family               Additional Comments: pt had difficulty providing PLOF-no family present during session  Prior Function Prior Level of Function : Patient poor historian/Family not available             Mobility Comments: unsure of pt's baseline       Extremity/Trunk Assessment   Upper Extremity Assessment Upper Extremity Assessment: Defer to OT evaluation    Lower Extremity Assessment Lower Extremity Assessment: Generalized weakness    Cervical / Trunk Assessment Cervical / Trunk Assessment: Normal  Communication    Communication Communication: No apparent difficulties    Cognition Arousal: Alert Behavior During Therapy: WFL for tasks assessed/performed   PT - Cognitive impairments: No family/caregiver present to determine baseline, Problem solving, Memory, Initiation                       PT - Cognition Comments: pt appears to have some confusion. Following commands: Impaired Following commands impaired: Follows one step commands inconsistently, Follows one step commands with increased time     Cueing Cueing Techniques: Verbal cues, Gestural cues, Tactile cues, Visual cues     General Comments      Exercises     Assessment/Plan    PT Assessment Patient needs continued PT services  PT Problem List Decreased strength;Decreased range of motion;Decreased activity tolerance;Decreased balance;Decreased mobility;Decreased knowledge of use of DME;Decreased cognition;Decreased safety awareness       PT Treatment Interventions DME instruction;Gait training;Functional mobility training;Therapeutic activities;Therapeutic exercise;Patient/family education;Balance training    PT Goals (Current goals can be found in the Care Plan section)  Acute Rehab PT Goals Patient Stated Goal: none stated PT Goal Formulation: Patient unable to participate in goal setting Time For Goal Achievement: 06/07/24 Potential to Achieve Goals: Fair    Frequency Min 3X/week     Co-evaluation               AM-PAC PT 6 Clicks Mobility  Outcome Measure Help needed turning from your back to your side while in a flat bed without using bedrails?: A Lot Help needed moving from lying on your back to sitting on the side of a flat bed without using bedrails?: A Lot Help needed moving to and from a bed to a chair (including a wheelchair)?: A Lot Help needed standing up from a chair using your arms (e.g., wheelchair or bedside chair)?: A Lot Help needed to walk in hospital room?: A Lot Help needed climbing  3-5 steps with a railing? : Total 6 Click Score: 11    End of Session Equipment Utilized During Treatment: Gait belt Activity Tolerance: Patient limited by pain;Patient limited by fatigue (limited by cognition) Patient left: in chair;with call bell/phone within reach;with chair alarm set   PT Visit Diagnosis: Muscle weakness (generalized) (M62.81);Difficulty in walking, not elsewhere classified (R26.2)    Time: 8964-8886 PT Time Calculation (min) (ACUTE ONLY): 38 min   Charges:   PT Evaluation $PT Eval Low Complexity: 1 Low   PT General Charges $$ ACUTE PT VISIT: 1 Visit          Dannial SQUIBB, PT Acute Rehabilitation  Office: [phone]

## 2024-05-24 NOTE — Progress Notes (Signed)
 Mobility Specialist - Progress Note   05/24/24 1524  Mobility  Activity Pivoted/transferred from chair to bed  Level of Assistance +2 (takes two people)  Press Photographer wheel walker  Range of Motion/Exercises Active  Activity Response Tolerated fair  Mobility visit 1 Mobility  Mobility Specialist Start Time (ACUTE ONLY) 1510  Mobility Specialist Stop Time (ACUTE ONLY) 1520  Mobility Specialist Time Calculation (min) (ACUTE ONLY) 10 min   RN requested assistance to transfer pt back to bed. NT assisted with transfer. Pt stated feeling weak. At EOS was left in bed with all needs met. NT in room.   Erminio Leos,  Mobility Specialist Can be reached via Secure Chat

## 2024-05-24 NOTE — Plan of Care (Signed)
  Problem: Clinical Measurements: Goal: Ability to maintain clinical measurements within normal limits will improve Outcome: Progressing   Problem: Clinical Measurements: Goal: Will remain free from infection Outcome: Progressing   Problem: Elimination: Goal: Will not experience complications related to bowel motility Outcome: Progressing   Problem: Elimination: Goal: Will not experience complications related to urinary retention Outcome: Progressing   Problem: Pain Managment: Goal: General experience of comfort will improve and/or be controlled Outcome: Progressing   Problem: Safety: Goal: Ability to remain free from injury will improve Outcome: Progressing   Problem: Skin Integrity: Goal: Risk for impaired skin integrity will decrease Outcome: Progressing

## 2024-05-24 NOTE — Progress Notes (Signed)
 [facility]                                                                              [name], is a 89 y.o. female, DOB - 08-31-1928, FMW:[id number] Admit date - 05/21/2024    Outpatient Primary MD for the patient is [name], MD  LOS - 2  days  Chief Complaint  Patient presents with   Fatigue       Brief summary   Patient is a 89 y.o. female retired [name] with borderline diabetes, HTN only on atenolol  was apparently brought to the ER by family due to lethargy, fatigue, lack of appetite.  Workup showed hemoglobin 4.0, stool Hemoccult exam is negative.  Patient denied any evidence of abdominal pain, nausea, hematochezia, melena or blood per rectum.  She has had colonoscopies many years ago and does not recall them ever being abnormal.  She denied any personal history of cancer, or GI bleeding.    Assessment & Plan       Symptomatic iron  deficiency anemia - Anemia profile showed iron  less than 10, TIBC 283, ferritin 43, B12 240, folate > 20 - Hb 4.0 on admission, s/p 3 units packed RBCs  - FOBT neg, GI consulted, awaiting recommendations - Hemoglobin 8.0, plan for endoscopy today - EGD 11/25 showed erythematous mucosa in the gastric fundus and gastric body, 2 cm hiatal hernia, gastric mucosal atrophy, few gastric polyps, status post biopsy - Colonoscopy 11/25 showed multiple colonic vascular lesions in the right colon versus changes from barotrauma, treated with APC, diverticulosis in the entire examined colon, 1 5-6 mm polyp at the hepatic plastic, not removed, 1 large polyp at the splenic flexure could be related to anemia, removed and clips placed, internal hemorrhoids. - Trend H&H, hemoglobin 7.7.  Will transfuse for hemoglobin less than 7.5.    Essential hypertension, benign - BP stable, continue atenolol , decrease dose to 25 mg daily    B12 deficiency - B12 240, borderline started replacement, IM until discharge, then will change to p.o.  Acute  urinary retention - Placed on Flomax  for 7 days, had I/O cath x 3, placed Foley catheter on 11/25 - CT abdomen ruled out any obstructive uropathy, hydronephrosis or any intra-abdominal mass or acute pathology.  Borderline diabetes mellitus Hemoglobin A1c 6.2 on 12/18/2020, will recheck Not on any oral hypoglycemics  Hypernatremia Improving, sodium 144  Hypokalemia Replaced  Dementia - Patient noted to be mostly sleeping.  Today alert and awake during my encounter and states she lives at home and a lot of people check on her, would like to go home but not today. - PT evaluation recommended SNF versus HHPT - CT head on 11/23 had hypodensities throughout the periventricular white matter consistent with chronic small vessel ischemic changes, no acute infarct or hemorrhage  Obesity class 1  Estimated body mass index is 34.75 kg/m as calculated from the following:   Height as of this encounter: 5' (1.524 m).   Weight as of this encounter: 80.7 kg.  Code Status: Full code DVT Prophylaxis:  SCDs Start: 05/21/24 1553   Level of Care: Level of care: Progressive  Family Communication: Updated patient's son, Robertta Halfhill Disposition Plan:      Remains inpatient appropriate: Discussed with patient's son, would like to take her home tomorrow, will arrange home health.  Declined SNF.  Will check CBC in a.m.   Procedures:  EGD Colonoscopy  Consultants:   Gastroenterology  Antimicrobials:   Anti-infectives (From admission, onward)    None          Medications  atenolol   25 mg Oral Daily   Chlorhexidine  Gluconate Cloth  6 each Topical Daily   cyanocobalamin   1,000 mcg Intramuscular Daily   feeding supplement  237 mL Oral BID BM   Influenza vac split trivalent PF  0.5 mL Intramuscular Tomorrow-1000   tamsulosin   0.4 mg Oral Daily      Subjective:   Brittany [name] was seen and examined today.  No acute issues, today noted to be alert and awake, no acute complaints, no  nausea vomiting, chest pain, shortness of breath or abdominal pain.  When asked if she would like to have breakfast, states she would like to sleep for another hour.  Objective:   Vitals:   05/23/24 2004 05/24/24 0010 05/24/24 0456 05/24/24 1028  BP: (!) 151/94 (!) 128/91 138/82 119/75  Pulse: 66 68 64 68  Resp: 16 17 18    Temp: 98.1 F (36.7 C) 99 F (37.2 C) 99 F (37.2 C)   TempSrc: Oral Oral Oral   SpO2: 100% 100% 100%   Weight:      Height:        Intake/Output Summary (Last 24 hours) at 05/24/2024 1231 Last data filed at 05/24/2024 0453 Gross per 24 hour  Intake 992.3 ml  Output 1075 ml  Net -82.7 ml     Wt Readings from Last 3 Encounters:  05/23/24 80.7 kg  03/20/22 78.9 kg  04/21/21 79 kg   Physical Exam General: Alert and oriented, NAD Cardiovascular: S1 S2 clear, RRR.  Respiratory: CTAB, no wheezing Gastrointestinal: Soft, nontender, nondistended, NBS Ext: no pedal edema bilaterally Neuro: no new deficits Psych: appears to have some underlying dementia    Data Reviewed:  I have personally reviewed following labs    CBC Lab Results  Component Value Date   WBC 11.2 (H) 05/24/2024   RBC 2.82 (L) 05/24/2024   HGB 7.7 (L) 05/24/2024   HCT 25.4 (L) 05/24/2024   MCV 90.1 05/24/2024   MCH 27.3 05/24/2024   PLT 200 05/24/2024   MCHC 30.3 05/24/2024   RDW 16.2 (H) 05/24/2024   LYMPHSABS 1.5 05/21/2024   MONOABS 0.9 05/21/2024   EOSABS 0.0 05/21/2024   BASOSABS 0.0 05/21/2024     Last metabolic panel Lab Results  Component Value Date   NA 144 05/24/2024   K 3.3 (L) 05/24/2024   CL 113 (H) 05/24/2024   CO2 20 (L) 05/24/2024   BUN 11 05/24/2024   CREATININE 1.04 (H) 05/24/2024   GLUCOSE 110 (H) 05/24/2024   GFRNONAA 50 (L) 05/24/2024   GFRAA 56 (L) 09/06/2018   CALCIUM  8.2 (L) 05/24/2024   PHOS 2.2 (L) 05/24/2024   PROT 6.8 05/21/2024   ALBUMIN 3.0 (L) 05/24/2024   BILITOT 0.3 05/21/2024   ALKPHOS 69 05/21/2024   AST 24 05/21/2024    ALT 9 05/21/2024   ANIONGAP 11 05/24/2024    CBG (last 3)  No results for input(s): GLUCAP in the last 72 hours.    Coagulation Profile: No results for input(s): INR, PROTIME in the last 168 hours.   Radiology  Studies: I have personally reviewed the imaging studies  CT ABDOMEN PELVIS WO CONTRAST Result Date: 05/23/2024 CLINICAL DATA:  Acute abdominal pain. Urinary retention. Severe anemia. EXAM: CT ABDOMEN AND PELVIS WITHOUT CONTRAST TECHNIQUE: Multidetector CT imaging of the abdomen and pelvis was performed following the standard protocol without IV contrast. RADIATION DOSE REDUCTION: This exam was performed according to the departmental dose-optimization program which includes automated exposure control, adjustment of the mA and/or kV according to patient size and/or use of iterative reconstruction technique. COMPARISON:  None Available. FINDINGS: Lower chest: No acute findings. Hepatobiliary: Multiple hepatic cysts are noted, largest in segment 3 of left lobe measuring 6 cm. No definite mass visualized on this unenhanced exam. Gallbladder is unremarkable. No evidence of biliary ductal dilatation. Pancreas: No mass or inflammatory process visualized on this unenhanced exam. Spleen:  Within normal limits in size. Adrenals/Urinary tract: Small bilateral low-attenuation adrenal masses are seen, consistent with benign adenomas. A few fluid attenuation renal cysts are noted bilaterally. (No followup imaging is recommended). No evidence of urolithiasis or hydronephrosis. Unremarkable unopacified urinary bladder. Stomach/Bowel: Small hiatal hernia. No evidence of obstruction, inflammatory process, or abnormal fluid collections. Extensive colonic diverticulosis without signs of diverticulitis. Vascular/Lymphatic: No pathologically enlarged lymph nodes identified. No evidence of abdominal aortic aneurysm. Reproductive:  No mass or other significant abnormality. Other:  Small fat-containing umbilical  hernia. Musculoskeletal:  No suspicious bone lesions identified. IMPRESSION: No evidence of urolithiasis, hydronephrosis, or other acute findings. Extensive colonic diverticulosis, without radiographic evidence of diverticulitis. Small hiatal hernia.  Small fat-containing umbilical hernia. Electronically Signed   By: Norleen DELENA Kil M.D.   On: 05/23/2024 10:44       Nathali Vent M.D. [facility] 05/24/2024, 12:31 PM  Available via Epic secure chat 7am-7pm After 7 pm, please refer to night coverage provider listed on amion.

## 2024-05-24 NOTE — Evaluation (Signed)
 Occupational Therapy Evaluation Patient Details Name: Brittany [name] MRN: [id number] DOB: 1929/04/13 Today's Date: 05/24/2024   History of Present Illness   89 yo female admitted with anemia-s/p transfusion x 3 units, acute urinary retention. Hx of DM, breast Ca, oteoporosis, DJD, obesity     Clinical Impressions Pt presents with decline in function and safety with ADLs and ADL mobility with impaired strength, balance, endurance and cognition. Pt unable to provide PLOF information and no family/caregiver present, unsure of pt's PLOF/cognitive baseline. Pt currently requires max A to sit EOB, CGA with UB ADLs, max-total A with LB ADLs, total A with toileting and mod A +2 for STS/mobility using RW. While standing, pt reported feeling dizzy, ankle pain, BP checked and was 143/85, O2 SATs 96% on RA. Pt seems to have some confusion with difficulty following commands. OT to follow acutely to maximize level of function and safety     If plan is discharge home, recommend the following:   A lot of help with bathing/dressing/bathroom;Two people to help with walking and/or transfers;Assistance with cooking/housework;Assist for transportation;Help with stairs or ramp for entrance     Functional Status Assessment   Patient has had a recent decline in their functional status and demonstrates the ability to make significant improvements in function in a reasonable and predictable amount of time.     Equipment Recommendations   Other (comment) (defer)     Recommendations for Other Services         Precautions/Restrictions         Mobility Bed Mobility Overal bed mobility: Needs Assistance       Supine to sit: Mod assist, +2 for physical assistance, +2 for safety/equipment, HOB elevated     General bed mobility comments: Repeated, multmodal cueing required. Assist for trunk and bil LEs. Utilized bedpad to aid with scooting to EOB. Pt c/o dizziness-BP, O2 ok.     Transfers Overall transfer level: Needs assistance Equipment used: Rolling walker (2 wheels) Transfers: Sit to/from Stand, Bed to chair/wheelchair/BSC Sit to Stand: Mod assist, +2 physical assistance, +2 safety/equipment, From elevated surface     Step pivot transfers: Min assist, +2 physical assistance, +2 safety/equipment     General transfer comment: Assist to power up, stabilize, control descent. Cues for safety, technique, hand placement. Pt able to step over to recliner with use of RW. Fall risk.      Balance Overall balance assessment: Needs assistance         Standing balance support: Bilateral upper extremity supported, During functional activity, Reliant on assistive device for balance Standing balance-Leahy Scale: Poor                             ADL either performed or assessed with clinical judgement   ADL Overall ADL's : Needs assistance/impaired Eating/Feeding: Independent;Sitting   Grooming: Wash/dry hands;Wash/dry face;Contact guard assist;Sitting   Upper Body Bathing: Contact guard assist;Sitting   Lower Body Bathing: Maximal assistance   Upper Body Dressing : Contact guard assist;Sitting   Lower Body Dressing: Maximal assistance   Toilet Transfer: Moderate assistance;Minimal assistance;+2 for physical assistance;Stand-pivot;Cueing for safety;Cueing for sequencing   Toileting- Clothing Manipulation and Hygiene: Total assistance;Sit to/from stand       Functional mobility during ADLs: Moderate assistance;Minimal assistance;+2 for physical assistance;Rolling walker (2 wheels);Cueing for safety;Cueing for sequencing       Vision Baseline Vision/History: 1 Wears glasses Ability to See in Adequate Light: 0 Adequate Patient Visual  Report: No change from baseline       Perception         Praxis         Pertinent Vitals/Pain Pain Assessment Pain Assessment: Faces Faces Pain Scale: Hurts little more Pain Location: pt reports  ankle pain with WBing Pain Intervention(s): Limited activity within patient's tolerance, Repositioned, Monitored during session     Extremity/Trunk Assessment Upper Extremity Assessment Upper Extremity Assessment: Generalized weakness   Lower Extremity Assessment Lower Extremity Assessment: Defer to PT evaluation   Cervical / Trunk Assessment Cervical / Trunk Assessment: Normal   Communication Communication Communication: No apparent difficulties   Cognition Arousal: Alert Behavior During Therapy: WFL for tasks assessed/performed Cognition: No family/caregiver present to determine baseline                               Following commands: Impaired Following commands impaired: Follows one step commands inconsistently, Follows one step commands with increased time     Cueing  General Comments   Cueing Techniques: Verbal cues;Gestural cues;Tactile cues;Visual cues      Exercises     Shoulder Instructions      Home Living Family/patient expects to be discharged to:: Private residence Living Arrangements: Children Available Help at Discharge: Family Type of Home: House                           Additional Comments: pt had difficulty providing PLOF-no family present during session      Prior Functioning/Environment Prior Level of Function : Patient poor historian/Family not available             Mobility Comments: unsure of pt's baseline ADLs Comments: unsure of pt's baseline    OT Problem List: Decreased strength;Decreased knowledge of use of DME or AE;Decreased cognition;Decreased activity tolerance;Impaired balance (sitting and/or standing);Pain   OT Treatment/Interventions: Self-care/ADL training;Patient/family education;Balance training;Therapeutic activities;DME and/or AE instruction      OT Goals(Current goals can be found in the care plan section)   Acute Rehab OT Goals Patient Stated Goal: none stated OT Goal Formulation:  With patient Time For Goal Achievement: 06/07/24 Potential to Achieve Goals: Good ADL Goals Pt Will Perform Grooming: with supervision;with set-up;sitting Pt Will Perform Upper Body Bathing: with supervision;with set-up;sitting Pt Will Perform Lower Body Bathing: with mod assist;sitting/lateral leans Pt Will Perform Upper Body Dressing: with supervision;with set-up;sitting Pt Will Transfer to Toilet: with mod assist;with min assist;ambulating Pt Will Perform Toileting - Clothing Manipulation and hygiene: with max assist;with mod assist;sitting/lateral leans;sit to/from stand   OT Frequency:  Min 2X/week    Co-evaluation              AM-PAC OT 6 Clicks Daily Activity     Outcome Measure Help from another person eating meals?: None Help from another person taking care of personal grooming?: A Little Help from another person toileting, which includes using toliet, bedpan, or urinal?: Total Help from another person bathing (including washing, rinsing, drying)?: A Lot Help from another person to put on and taking off regular upper body clothing?: A Little Help from another person to put on and taking off regular lower body clothing?: Total 6 Click Score: 14   End of Session Equipment Utilized During Treatment: Gait belt;Rolling walker (2 wheels) Nurse Communication: Mobility status  Activity Tolerance: Patient tolerated treatment well Patient left: in chair;with call bell/phone within reach;with chair alarm set  OT  Visit Diagnosis: Unsteadiness on feet (R26.81);Other abnormalities of gait and mobility (R26.89);Muscle weakness (generalized) (M62.81);Pain Pain - Right/Left: Right Pain - part of body: Hip                Time: 8964-8940 OT Time Calculation (min): 24 min Charges:  OT Evaluation $OT Eval Moderate Complexity: 1 Mod    Jacques Karna Loose 05/24/2024, 12:37 PM

## 2024-05-24 NOTE — TOC Progression Note (Addendum)
 Transition of Care [facility]) - Progression Note    Patient Details  Name: Brittany [name] MRN: [id number] Date of Birth: 06-Jun-1929  Transition of Care [facility] CM/SW Contact  Yahshua Thibault, Nathanel, RN Phone Number: 05/24/2024, 12:56 PM  Clinical Narrative: Faxed out for [facility] agencies for HHPT/OT to accept await choices. Already has rw.  -2:28p-[facility] accepted for HHPT/OT-rep Amy aware.Ubaldo (son) agree. D/c n am.     Expected Discharge Plan: Home w Home Health Services Barriers to Discharge: Continued Medical Work up               Expected Discharge Plan and Services   Discharge Planning Services: CM Consult Post Acute Care Choice: Home Health Living arrangements for the past 2 months: Single Family Home                                       Social Drivers of Health (SDOH) Interventions SDOH Screenings   Food Insecurity: No Food Insecurity (05/21/2024)  Housing: Unknown (05/21/2024)  Transportation Needs: No Transportation Needs (05/21/2024)  Utilities: Not At Risk (05/21/2024)  Alcohol Screen: Low Risk  (03/20/2022)  Depression (PHQ2-9): Low Risk  (03/20/2022)  Financial Resource Strain: Low Risk  (03/20/2022)  Physical Activity: Inactive (03/20/2022)  Social Connections: Socially Isolated (05/21/2024)  Stress: No Stress Concern Present (03/20/2022)  Tobacco Use: Low Risk  (05/23/2024)    Readmission Risk Interventions     No data to display

## 2024-05-24 NOTE — Progress Notes (Signed)
 Progress Note   Subjective  Chief Complaint: Anemia, iron  deficiency  Today, patient is sound asleep again.  When I wake her she tells me she is doing well.  No complaints or concerns.  Per nursing staff did well overnight.    Objective   Vital signs in last 24 hours: Temp:  [97.5 F (36.4 C)-99 F (37.2 C)] 99 F (37.2 C) (11/26 0456) Pulse Rate:  [64-82] 68 (11/26 1028) Resp:  [14-21] 18 (11/26 0456) BP: (119-151)/(67-94) 119/75 (11/26 1028) SpO2:  [96 %-100 %] 100 % (11/26 0456) Last BM Date : 05/23/24 General:    [name] in NAD Heart:  Regular rate and rhythm; no murmurs Lungs: Respirations even and unlabored, lungs CTA bilaterally Abdomen:  Soft, nontender and nondistended. Normal bowel sounds. Psych:  Cooperative. Normal mood and affect.  Intake/Output from previous day: 11/25 0701 - 11/26 0700 In: 992.3 [I.V.:992.3] Out: 1075 [Urine:1075]   Lab Results: Recent Labs    05/22/24 0503 05/23/24 0507 05/24/24 0530  WBC 11.2* 12.2* 11.2*  HGB 6.6* 8.0* 7.7*  HCT 21.7* 26.6* 25.4*  PLT 218 235 200   BMET Recent Labs    05/22/24 0503 05/23/24 0507 05/24/24 0530  NA 142 149* 144  K 4.1 3.7 3.3*  CL 113* 118* 113*  CO2 17* 17* 20*  GLUCOSE 117* 114* 110*  BUN 20 15 11   CREATININE 1.43* 1.19* 1.04*  CALCIUM  8.0* 8.7* 8.2*   LFT Recent Labs    05/21/24 1419 05/23/24 0507 05/24/24 0530  PROT 6.8  --   --   ALBUMIN 3.5   < > 3.0*  AST 24  --   --   ALT 9  --   --   ALKPHOS 69  --   --   BILITOT 0.3  --   --    < > = values in this interval not displayed.   Studies/Results: CT ABDOMEN PELVIS WO CONTRAST Result Date: 05/23/2024 CLINICAL DATA:  Acute abdominal pain. Urinary retention. Severe anemia. EXAM: CT ABDOMEN AND PELVIS WITHOUT CONTRAST TECHNIQUE: Multidetector CT imaging of the abdomen and pelvis was performed following the standard protocol without IV contrast. RADIATION DOSE REDUCTION: This exam was performed  according to the departmental dose-optimization program which includes automated exposure control, adjustment of the mA and/or kV according to patient size and/or use of iterative reconstruction technique. COMPARISON:  None Available. FINDINGS: Lower chest: No acute findings. Hepatobiliary: Multiple hepatic cysts are noted, largest in segment 3 of left lobe measuring 6 cm. No definite mass visualized on this unenhanced exam. Gallbladder is unremarkable. No evidence of biliary ductal dilatation. Pancreas: No mass or inflammatory process visualized on this unenhanced exam. Spleen:  Within normal limits in size. Adrenals/Urinary tract: Small bilateral low-attenuation adrenal masses are seen, consistent with benign adenomas. A few fluid attenuation renal cysts are noted bilaterally. (No followup imaging is recommended). No evidence of urolithiasis or hydronephrosis. Unremarkable unopacified urinary bladder. Stomach/Bowel: Small hiatal hernia. No evidence of obstruction, inflammatory process, or abnormal fluid collections. Extensive colonic diverticulosis without signs of diverticulitis. Vascular/Lymphatic: No pathologically enlarged lymph nodes identified. No evidence of abdominal aortic aneurysm. Reproductive:  No mass or other significant abnormality. Other:  Small fat-containing umbilical hernia. Musculoskeletal:  No suspicious bone lesions identified. IMPRESSION: No evidence of urolithiasis, hydronephrosis, or other acute findings. Extensive colonic diverticulosis, without radiographic evidence of diverticulitis. Small hiatal hernia.  Small fat-containing umbilical hernia. Electronically Signed   By: Norleen DELENA Kil M.D.   On: 05/23/2024  10:44   EGD 05/23/2024 Findings:      Esophagogastric landmarks were identified: the Z-line was found at 34       cm, the gastroesophageal junction was found at 34 cm and the upper       extent of the gastric folds was found at 36 cm from the incisors.      A 2 cm hiatal hernia  was present.      The examined esophagus was tortuous.      The exam of the esophagus was otherwise normal.      Patchy mildly erythematous mucosa was found in the gastric fundus and in       the gastric body. No ulcerations / erosions noted.      Diffuse atrophic mucosa was found in the entire examined stomach.       Biopsies were taken with a cold forceps for Helicobacter pylori testing.      A few small sessile polyps were found in the gastric body and in the       gastric antrum. A representative polyp was removed with a cold biopsy       forceps, rule out adenoma. Resection and retrieval were complete.      The exam of the stomach was otherwise normal.      The examined duodenum was normal. The duodenal sweep was angulated. Some       mild contact irritation from the endoscope noted during withdrawal in       this area (in case capsule endoscopy is pursued) Impression:               - Esophagogastric landmarks identified.                           - 2 cm hiatal hernia.                           - Tortuous esophagus.                           - Erythematous mucosa in the gastric fundus and                            gastric body.                           - Gastric mucosal atrophy. Biopsied.                           - A few gastric polyps. Representative lesion                            resected and retrieved - rule out adenoma.                           - Normal examined duodenum. Recommendation:           - Proceed with colonoscopy.                           - Full recommendations per colonoscopy note.   Colonoscopy 05/23/2024 Findings:      The perianal and digital rectal examinations  were normal.      Multiple small vascular lesions with oozing were found in the ascending       colon. This could have been due to barotrauma from air insufflation,       however similar findings on the last colonoscopy in 2003. Fulguration to       ablate the lesions via APC to prevent  bleeding was successful.      Many medium-mouthed diverticula were found in the entire colon. The left       colon was quite restricted and then with looping in the right colon.       Cecal intubation was challenging.      A 5 to 6 mm polyp was found in the hepatic flexure. The polyp was       sessile. It was not removed to avoid confounding the presentation with       removal, it is currently not causing any problems.      A large polyp was found in the splenic flexure - perhaps 4-5cm in size       or so. The polyp was semi-pedunculated and had a lobulated head. There       was some adherent heme on the polyp on initial evaluation. Unclear if       findings in the right colon represented barotrauma or not, and possible       this large polyp could be related to iron  deficiency. The stalk / base       of the polyp was successfully injected with 3 mL of a 0.1 mg/mL solution       of epinephrine  for drug delivery and to lift it, and it lifted well. The       polyp was removed with a hot snare, it took a few passes to remove the       base of it in entirety. Resection and retrieval were complete. To       prevent bleeding after the polypectomy, five hemostatic clips were       successfully placed across the polypectomy site Area distal to the polyp       was tattooed with an injection of Spot (carbon black).      Internal hemorrhoids were found.      The exam was otherwise without abnormality. Impression:               - Multiple colonic vasular lesions in the right                            colon versus changes from barotrauma - although                            findings were present in 2003. Treated with argon                            plasma coagulation (APC).                           - Diverticulosis in the entire examined colon.                           - One 5 to 6 mm polyp at the hepatic flexure - not  removed.                           - One large  polyp at the splenic flexure - possible                            it could be related to anemia, removed as outlined                            above. Clips were placed. Tattooed.                           - Internal hemorrhoids.                           - The examination was otherwise normal.                           Unclear what is driving IDA - unclear again if                            right colon changes were due to barotrauma or not,                            but treated. Large polyp removed from the splenic                            flexure, and gastric erythema / gastric atrophy                            also noted. Recommendation:           - Return patient to hospital ward for ongoing care.                           - Advance diet as tolerated.                           - Continue present medications.                           - Await pathology results.                           - Trend Hgb, monitor for bleeding symptoms                           - GI service will reassess the patient tomorrow.                            Family will be contacted with the results.   Assessment / Plan:   Assessment: 1.  Symptomatic anemia: Severely iron  deficient with an iron  less than 10, ferritin normal, who presented with a hemoglobin of 4--> 2 units PRBCs--> 6.6--> 1 unit PRBCs--> 8.0--> 7.7 overnight, Hemoccult negative, BUN normal, EGD and colonoscopy yesterday as above with vascular  lesions throughout the right colon versus changes of barotrauma, treated with APC, a few polyps with biopsies pending, hemoglobin stable overnight 2.  AKI superimposed on CKD stage III: Creatinine much improved 3.  Altered mental status: Seems chronic for the patient with some level of dementia, son oversees her care  Plan: 1.  It looks like patient's hemoglobin has remained stable overnight.  She will be notified of biopsy results when we receive them. 2.  No further recommendations per GI at this time 3.  As long as patient remains in the hospital, continue to monitor hgb with transfusion as needed <7  Thank you for your kind consultation, we will sign off.    LOS: 2 days   Delon Hendricks Failing  05/24/2024, 11:48 AM

## 2024-05-25 DIAGNOSIS — K5520 Angiodysplasia of colon without hemorrhage: Secondary | ICD-10-CM | POA: Diagnosis not present

## 2024-05-25 DIAGNOSIS — E538 Deficiency of other specified B group vitamins: Secondary | ICD-10-CM | POA: Diagnosis not present

## 2024-05-25 DIAGNOSIS — D508 Other iron deficiency anemias: Secondary | ICD-10-CM | POA: Diagnosis not present

## 2024-05-25 DIAGNOSIS — D649 Anemia, unspecified: Secondary | ICD-10-CM | POA: Diagnosis not present

## 2024-05-25 LAB — RENAL FUNCTION PANEL
Albumin: 2.9 g/dL — ABNORMAL LOW (ref 3.5–5.0)
Anion gap: 10 (ref 5–15)
BUN: 12 mg/dL (ref 8–23)
CO2: 19 mmol/L — ABNORMAL LOW (ref 22–32)
Calcium: 8.3 mg/dL — ABNORMAL LOW (ref 8.9–10.3)
Chloride: 112 mmol/L — ABNORMAL HIGH (ref 98–111)
Creatinine, Ser: 1.1 mg/dL — ABNORMAL HIGH (ref 0.44–1.00)
GFR, Estimated: 47 mL/min — ABNORMAL LOW (ref >60)
Glucose, Bld: 90 mg/dL (ref 70–99)
Phosphorus: 2 mg/dL — ABNORMAL LOW (ref 2.5–4.6)
Potassium: 3.7 mmol/L (ref 3.5–5.1)
Sodium: 140 mmol/L (ref 135–145)

## 2024-05-25 LAB — CBC
HCT: 29.3 % — ABNORMAL LOW (ref 36.0–46.0)
Hemoglobin: 9.4 g/dL — ABNORMAL LOW (ref 12.0–15.0)
MCH: 27.4 pg (ref 26.0–34.0)
MCHC: 32.1 g/dL (ref 30.0–36.0)
MCV: 85.4 fL (ref 80.0–100.0)
Platelets: 180 K/uL (ref 150–400)
RBC: 3.43 MIL/uL — ABNORMAL LOW (ref 3.87–5.11)
RDW: 16.9 % — ABNORMAL HIGH (ref 11.5–15.5)
WBC: 10.4 K/uL (ref 4.0–10.5)
nRBC: 2.2 % — ABNORMAL HIGH (ref 0.0–0.2)

## 2024-05-25 MED ORDER — METRONIDAZOLE 375 MG PO CAPS: 375.0000 mg | ORAL_CAPSULE | Freq: Three times a day (TID) | ORAL | 0 refills | Status: DC

## 2024-05-25 MED ORDER — VITAMIN B-12 1000 MCG PO TABS: 1000.0000 ug | ORAL_TABLET | Freq: Every day | ORAL | 3 refills | Status: DC

## 2024-05-25 MED ORDER — OMEPRAZOLE MAGNESIUM 20 MG PO TBEC: 20.0000 mg | DELAYED_RELEASE_TABLET | Freq: Two times a day (BID) | ORAL | 0 refills | Status: DC

## 2024-05-25 MED ORDER — TAMSULOSIN HCL 0.4 MG PO CAPS: 0.4000 mg | ORAL_CAPSULE | Freq: Every day | ORAL | 0 refills | Status: AC

## 2024-05-25 MED ORDER — BISMUTH SUBSALICYLATE 262 MG PO CHEW: 524.0000 mg | CHEWABLE_TABLET | Freq: Three times a day (TID) | ORAL | 0 refills | Status: DC

## 2024-05-25 MED ORDER — TETRACYCLINE HCL 500 MG PO CAPS: 500.0000 mg | ORAL_CAPSULE | Freq: Four times a day (QID) | ORAL | 0 refills | Status: DC

## 2024-05-25 MED ORDER — ONDANSETRON HCL 4 MG PO TABS: 4.0000 mg | ORAL_TABLET | Freq: Four times a day (QID) | ORAL | 1 refills | Status: DC | PRN

## 2024-05-25 MED ORDER — ATENOLOL 25 MG PO TABS: 25.0000 mg | ORAL_TABLET | Freq: Every day | ORAL | 2 refills | Status: DC

## 2024-05-25 NOTE — TOC Transition Note (Signed)
 Transition of Care [facility]) - Discharge Note   Patient Details  Name: Brittany [name] MRN: [id number] Date of Birth: 1929-02-17  [facility]) CM/SW Contact:  Bascom Service, RN Phone Number: 05/25/2024, 10:45 AM   Clinical Narrative: d/c home w/[facility] already set up for HHPT/OT. No further CM needs.      Final next level of care: Home w Home Health Services Barriers to Discharge: No Barriers Identified   Patient Goals and CMS Choice Patient states their goals for this hospitalization and ongoing recovery are:: Home CMS Medicare.gov Compare Post Acute Care list provided to:: Patient Represenative (must comment) ([name](Son)) Choice offered to / list presented to : Adult Children [place] ownership interest in [facility].provided to:: Adult Children    Discharge Placement                       Discharge Plan and Services Additional resources added to the After Visit Summary for     Discharge Planning Services: CM Consult Post Acute Care Choice: Home Health                    HH Arranged: PT, OT [facility] Agency: [facility] Date [facility] Agency Contacted: 05/25/24 Time HH Agency Contacted: 1045 Representative spoke with at [facility] Agency: Amy  Social Drivers of Health (SDOH) Interventions SDOH Screenings   Food Insecurity: No Food Insecurity (05/21/2024)  Housing: Unknown (05/21/2024)  Transportation Needs: No Transportation Needs (05/21/2024)  Utilities: Not At Risk (05/21/2024)  Alcohol Screen: Low Risk  (03/20/2022)  Depression (PHQ2-9): Low Risk  (03/20/2022)  Financial Resource Strain: Low Risk  (03/20/2022)  Physical Activity: Inactive (03/20/2022)  Social Connections: Socially Isolated (05/21/2024)  Stress: No Stress Concern Present (03/20/2022)  Tobacco Use: Low Risk  (05/23/2024)     Readmission Risk Interventions     No data to display

## 2024-05-25 NOTE — Plan of Care (Signed)
 Attempted to ambulate pt in the hall. Pt very deconditioned. Unable to coordinate walking using the walker.

## 2024-05-25 NOTE — Progress Notes (Addendum)
 Attempted to ambulate pt in the hall. Pt very deconditioned. Unable to coordinate walking using the walker. Will notify MD of finds. Additionally the pt requested to use the bathroom.  we were able to get her on the [facility] with max assist, Per the son PTA she was able to move around with the walker for short periods of time.

## 2024-05-25 NOTE — Progress Notes (Signed)
 Spoke with son to inquire about mobility at home. According to the son she could get up and go to bathroom and do things for short periods of time with a walker.. Share that pt could not stand with out 2 staff members with max assistance. Shared concerns for safety. Talked with pt as well and she is receptive to going to SNF for short term Shred that her son and husband live with her. That son works but the DIL doesn't. MD made aware and d/c cancelled at this time. Son and pt aware and receptive

## 2024-05-25 NOTE — Discharge Summary (Addendum)
 Physician Discharge Summary   [name] MRN: [id number] DOB: 1928-09-11  Admit date:     05/21/2024  Discharge date: 05/25/24  Discharge Physician: Nydia Distance, MD    PCP: [name], MD   Recommendations at discharge:   Follow CBC in 1 week Continue B12 replacement 1000 mcg daily Per GI recommendations, on discharge placed on H. pylori treatment regimen for 14 days Omeprazole  20 mg twice daily for 14 days Pepto-Bismol 2 tabs (to 62 mg each) 4 times daily for 14 days Metronidazole  500 mg 3 times daily Tetracycline  500 mg 4 times daily Outpatient follow-up with GI  Discharge Diagnoses:  Symptomatic acute iron  deficiency anemia anemia   Essential hypertension, benign   Anemia, iron  deficiency   B12 deficiency   Gastric erythema   Gastric polyp   AVM (arteriovenous malformation) of colon   Benign neoplasm of colon   Helicobacter pylori gastritis   History of colonic polyps Acute urinary retention   Hospital Course:  Patient is a 89 y.o. female with borderline diabetes, HTN only on atenolol  was apparently brought to the ER by family due to lethargy, fatigue, lack of appetite.  Workup showed hemoglobin 4.0, stool Hemoccult exam is negative.  Patient denied any evidence of abdominal pain, nausea, hematochezia, melena or blood per rectum.  She has had colonoscopies many years ago and does not recall them ever being abnormal.  She denied any personal history of cancer, or GI bleeding    Assessment and Plan:   Symptomatic iron  deficiency anemia, acute on chronic -No recent hemoglobin levels available, hemoglobin 08/2020 was 13.4 - Anemia profile showed iron  less than 10, TIBC 283, ferritin 43, B12 240, folate > 20 - Hb 4.0 on admission - FOBT neg, GI was consulted. - Received total of 4 units of packed RBCs during hospitalization - EGD 11/25 showed erythematous mucosa in the gastric fundus and gastric body, 2 cm hiatal hernia, gastric mucosal atrophy, few  gastric polyps, status post biopsy - Colonoscopy 11/25 showed multiple colonic vascular lesions in the right colon versus changes from barotrauma, treated with APC, diverticulosis in the entire examined colon, 1 5-6 mm polyp at the hepatic plastic, not removed, 1 large polyp at the splenic flexure could be related to anemia, removed and clips placed, internal hemorrhoids. - Per GI conditions, received 1 unit of packed RBC, IV iron  x 1 on 11/26 -Hemoglobin 9.4 at discharge. - Per GI, Dr. Leigh, she should be treated for H. pylori and will perform eradication testing as outpatient.  Recommended omeprazole , Pepto-Bismol, metronidazole , tetracycline  treatment for 14 days.     Essential hypertension, benign - BP stable, continue atenolol , decrease dose to 25 mg daily     B12 deficiency - B12 240, borderline continue p.o. replacement   Acute urinary retention -had I/O cath x 3, placed Foley catheter on 11/25 - CT abdomen ruled out any obstructive uropathy, hydronephrosis or any intra-abdominal mass or acute pathology. -Foley catheter removed, continue Flomax  for 7 days, awaiting voiding   Borderline diabetes mellitus Hemoglobin A1c 6.2 on 12/18/2020, will recheck Not on any oral hypoglycemics   Hypernatremia Improving, sodium 144   Hypokalemia Replaced   ?Mild dementia - - PT evaluation recommended SNF versus HHPT.  Discussed with patient's son and he requested home health PT. - CT head on 11/23 had hypodensities throughout the periventricular white matter consistent with chronic small vessel ischemic changes, no acute infarct or hemorrhage -Today alert and awake, eating breakfast, wants to go home.  Obesity class 1  Estimated body mass index is 34.75 kg/m as calculated from the following:   Height as of this encounter: 5' (1.524 m).   Weight as of this encounter: 80.7 kg.      Pain control - [place]  Controlled Substance Reporting System database was reviewed. and  patient was instructed, not to drive, operate heavy machinery, perform activities at heights, swimming or participation in water activities or provide baby-sitting services while on Pain, Sleep and Anxiety Medications; until their outpatient Physician has advised to do so again. Also recommended to not to take more than prescribed Pain, Sleep and Anxiety Medications.  Consultants: Gastroenterology Procedures performed: EGD, colonoscopy Disposition: Home Diet recommendation:  Discharge Diet Orders (From admission, onward)     Start     Ordered   05/25/24 0000  Diet - low sodium heart healthy        05/25/24 1012           Cardiac diet DISCHARGE MEDICATION: Allergies as of 05/25/2024       Reactions   Hctz [hydrochlorothiazide ] Other (See Comments)   Reactions: sweating, heart pain, itching   Amlodipine  Besylate    Muscle aches   Chlorthalidone  Other (See Comments)   Muscle aches   Indapamide  Hives, Itching   Olmesartan     headache   Alendronate  Sodium    REACTION: heartburn   Lisinopril    REACTION: dizziness   Spironolactone  Itching   Bystolic  [nebivolol  Hcl] Itching, Rash   Valsartan     itching        Medication List     TAKE these medications    atenolol  25 MG tablet Commonly known as: TENORMIN  Take 1 tablet (25 mg total) by mouth daily. What changed:  medication strength how much to take   bismuth  subsalicylate 262 MG chewable tablet Commonly known as: Pepto-Bismol Chew 2 tablets (524 mg total) by mouth 4 (four) times daily -  before meals and at bedtime for 14 days.   cyanocobalamin  1000 MCG tablet Commonly known as: VITAMIN B12 Take 1 tablet (1,000 mcg total) by mouth daily.   metronidazole  375 MG capsule Commonly known as: Flagyl  Take 1 capsule (375 mg total) by mouth 3 (three) times daily for 14 days.   omeprazole  20 MG tablet Commonly known as: PriLOSEC  OTC Take 1 tablet (20 mg total) by mouth 2 (two) times daily for 14 days.   ondansetron   4 MG tablet Commonly known as: ZOFRAN  Take 1 tablet (4 mg total) by mouth every 6 (six) hours as needed for nausea.   tamsulosin  0.4 MG Caps capsule Commonly known as: FLOMAX  Take 1 capsule (0.4 mg total) by mouth daily for 7 days.   tetracycline  500 MG capsule Commonly known as: SUMYCIN  Take 1 capsule (500 mg total) by mouth 4 (four) times daily for 14 days.        Contact information for follow-up providers     [name], MD. Schedule an appointment as soon as possible for a visit in 2 week(s).   Specialty: Internal Medicine Why: for hospital follow-up Contact information: [address] [place] [address] [phone]         Leigh Elspeth SQUIBB, MD. Schedule an appointment as soon as possible for a visit in 2 week(s).   Specialty: Gastroenterology Why: for hospital follow-up Contact information: [address] [place] [address] [phone]              Contact information for after-discharge  care     Home Medical Care     [facility]) .   Service: Home Health Services Why: HHPT/OT Contact information: [address] [place]  [phone]                    Discharge Exam: Filed Weights   05/21/24 1748 05/23/24 1124  Weight: 80.7 kg 80.7 kg   S: Alert and awake, eating breakfast, wants to go home.  BP (!) 144/65   Pulse 67   Temp 98.1 F (36.7 C) (Oral)   Resp 16   Ht 5' (1.524 m)   Wt 80.7 kg   SpO2 99%   BMI 34.75 kg/m   Physical Exam General: Alert and oriented x 3, NAD Cardiovascular: S1 S2 clear, RRR.  Respiratory: CTAB, no wheezing Gastrointestinal: Soft, nontender, nondistended, NBS Ext: no pedal edema bilaterally Neuro: no new deficits Psych: Normal affect    Condition at discharge: fair  The results of significant diagnostics from this hospitalization (including imaging, microbiology, ancillary and laboratory) are listed below for reference.    Imaging Studies: CT ABDOMEN PELVIS WO CONTRAST Result Date: 05/23/2024 CLINICAL DATA:  Acute abdominal pain. Urinary retention. Severe anemia. EXAM: CT ABDOMEN AND PELVIS WITHOUT CONTRAST TECHNIQUE: Multidetector CT imaging of the abdomen and pelvis was performed following the standard protocol without IV contrast. RADIATION DOSE REDUCTION: This exam was performed according to the departmental dose-optimization program which includes automated exposure control, adjustment of the mA and/or kV according to patient size and/or use of iterative reconstruction technique. COMPARISON:  None Available. FINDINGS: Lower chest: No acute findings. Hepatobiliary: Multiple hepatic cysts are noted, largest in segment 3 of left lobe measuring 6 cm. No definite mass visualized on this unenhanced exam. Gallbladder is unremarkable. No evidence of biliary ductal dilatation. Pancreas: No mass or inflammatory process visualized on this unenhanced exam. Spleen:  Within normal limits in size. Adrenals/Urinary tract: Small bilateral low-attenuation adrenal masses are seen, consistent with benign adenomas. A few fluid attenuation renal cysts are noted bilaterally. (No followup imaging is recommended). No evidence of urolithiasis or hydronephrosis. Unremarkable unopacified urinary bladder. Stomach/Bowel: Small hiatal hernia. No evidence of obstruction, inflammatory process, or abnormal fluid collections. Extensive colonic diverticulosis without signs of diverticulitis. Vascular/Lymphatic: No pathologically enlarged lymph nodes identified. No evidence of abdominal aortic aneurysm. Reproductive:  No mass or other significant abnormality. Other:  Small fat-containing umbilical hernia. Musculoskeletal:  No suspicious bone lesions identified. IMPRESSION: No evidence of urolithiasis, hydronephrosis, or other acute findings. Extensive colonic diverticulosis, without radiographic evidence of diverticulitis. Small hiatal hernia.  Small  fat-containing umbilical hernia. Electronically Signed   By: Norleen DELENA Kil M.D.   On: 05/23/2024 10:44   CT HEAD WO CONTRAST Result Date: 05/21/2024 CLINICAL DATA:  Altered level of consciousness EXAM: CT HEAD WITHOUT CONTRAST TECHNIQUE: Contiguous axial images were obtained from the base of the skull through the vertex without intravenous contrast. RADIATION DOSE REDUCTION: This exam was performed according to the departmental dose-optimization program which includes automated exposure control, adjustment of the mA and/or kV according to patient size and/or use of iterative reconstruction technique. COMPARISON:  None Available. FINDINGS: Brain: Scattered hypodensities throughout the periventricular white matter consistent with chronic small vessel ischemic changes. No evidence of acute infarct or hemorrhage. The lateral ventricles and midline structures appear unremarkable. No acute extra-axial fluid collections. No mass effect. Vascular: No hyperdense vessel or unexpected calcification. Diffuse atherosclerosis. Skull: Normal. Negative for fracture or focal lesion. Sinuses/Orbits:  No acute finding. Other: None. IMPRESSION: 1. No acute intracranial process. Electronically Signed   By: Ozell Daring M.D.   On: 05/21/2024 15:42   DG Chest Port 1 View Result Date: 05/21/2024 EXAM: 1 VIEW(S) XRAY OF THE CHEST 05/21/2024 02:24:00 PM COMPARISON: CT chest 05/16/2015. CLINICAL HISTORY: weakness FINDINGS: LUNGS AND PLEURA: Shallow inspiration. No focal pulmonary opacity. No pleural effusion. No pneumothorax. HEART AND MEDIASTINUM: Cardiac enlargement. Dilated, calcified, and tortuous aorta. BONES AND SOFT TISSUES: Degenerative changes in the spine and shoulders. No acute osseous abnormality. IMPRESSION: 1. No acute cardiopulmonary process identified. 2. Cardiomegaly with a dilated, calcified, and tortuous aorta. Electronically signed by: Elsie Gravely MD 05/21/2024 02:29 PM EST RP Workstation: [id number]     Microbiology: Results for orders placed or performed in visit on 01/23/19  CULTURE, URINE COMPREHENSIVE     Status: None   Collection Time: 01/23/19 10:58 AM   Specimen: Urine  Result Value Ref Range Status   MICRO NUMBER: [id number]  Final   SPECIMEN QUALITY: Adequate  Final   Source OTHER (SPECIFY)  Final   STATUS: FINAL  Final   RESULT: No Growth  Final    Labs: CBC: Recent Labs  Lab 05/21/24 1419 05/22/24 0503 05/23/24 0507 05/24/24 0530 05/25/24 0507  WBC 9.9 11.2* 12.2* 11.2* 10.4  NEUTROABS 7.4  --   --   --   --   HGB 4.0* 6.6* 8.0* 7.7* 9.4*  HCT 14.3* 21.7* 26.6* 25.4* 29.3*  MCV 91.1 90.0 92.0 90.1 85.4  PLT 286 218 235 200 180   Basic Metabolic Panel: Recent Labs  Lab 05/21/24 1419 05/22/24 0503 05/23/24 0507 05/24/24 0530 05/25/24 0507  NA 142 142 149* 144 140  K 4.3 4.1 3.7 3.3* 3.7  CL 110 113* 118* 113* 112*  CO2 17* 17* 17* 20* 19*  GLUCOSE 124* 117* 114* 110* 90  BUN 19 20 15 11 12   CREATININE 1.68* 1.43* 1.19* 1.04* 1.10*  CALCIUM  8.2* 8.0* 8.7* 8.2* 8.3*  PHOS  --   --  2.4* 2.2* 2.0*   Liver Function Tests: Recent Labs  Lab 05/21/24 1419 05/23/24 0507 05/24/24 0530 05/25/24 0507  AST 24  --   --   --   ALT 9  --   --   --   ALKPHOS 69  --   --   --   BILITOT 0.3  --   --   --   PROT 6.8  --   --   --   ALBUMIN 3.5 3.3* 3.0* 2.9*   CBG: No results for input(s): GLUCAP in the last 168 hours.  Discharge time spent: greater than 30 minutes.  Signed: Nydia Distance, MD [facility] 05/25/2024

## 2024-05-26 DIAGNOSIS — D126 Benign neoplasm of colon, unspecified: Secondary | ICD-10-CM

## 2024-05-26 DIAGNOSIS — K5520 Angiodysplasia of colon without hemorrhage: Secondary | ICD-10-CM | POA: Diagnosis not present

## 2024-05-26 DIAGNOSIS — E538 Deficiency of other specified B group vitamins: Secondary | ICD-10-CM | POA: Diagnosis not present

## 2024-05-26 LAB — TYPE AND SCREEN
ABO/RH(D): B POS
Antibody Screen: NEGATIVE
Unit division: 0
Unit division: 0
Unit division: 0
Unit division: 0

## 2024-05-26 LAB — BPAM RBC
Blood Product Expiration Date: 202512192359
Blood Product Expiration Date: 202512192359
Blood Product Expiration Date: 202512192359
Blood Product Expiration Date: 202512282359
ISSUE DATE / TIME: 202511231858
ISSUE DATE / TIME: 202511232247
ISSUE DATE / TIME: 202511240740
ISSUE DATE / TIME: 202511261754
Unit Type and Rh: 7300
Unit Type and Rh: 7300
Unit Type and Rh: 7300
Unit Type and Rh: 7300

## 2024-05-26 LAB — BASIC METABOLIC PANEL WITH GFR
Anion gap: 11 (ref 5–15)
BUN: 11 mg/dL (ref 8–23)
CO2: 18 mmol/L — ABNORMAL LOW (ref 22–32)
Calcium: 8.8 mg/dL — ABNORMAL LOW (ref 8.9–10.3)
Chloride: 110 mmol/L (ref 98–111)
Creatinine, Ser: 1.07 mg/dL — ABNORMAL HIGH (ref 0.44–1.00)
GFR, Estimated: 48 mL/min — ABNORMAL LOW (ref >60)
Glucose, Bld: 93 mg/dL (ref 70–99)
Potassium: 3.5 mmol/L (ref 3.5–5.1)
Sodium: 139 mmol/L (ref 135–145)

## 2024-05-26 LAB — CBC
HCT: 30.3 % — ABNORMAL LOW (ref 36.0–46.0)
Hemoglobin: 9.4 g/dL — ABNORMAL LOW (ref 12.0–15.0)
MCH: 26.8 pg (ref 26.0–34.0)
MCHC: 31 g/dL (ref 30.0–36.0)
MCV: 86.3 fL (ref 80.0–100.0)
Platelets: 196 K/uL (ref 150–400)
RBC: 3.51 MIL/uL — ABNORMAL LOW (ref 3.87–5.11)
RDW: 16.9 % — ABNORMAL HIGH (ref 11.5–15.5)
WBC: 10 K/uL (ref 4.0–10.5)
nRBC: 0.9 % — ABNORMAL HIGH (ref 0.0–0.2)

## 2024-05-26 MED ORDER — SODIUM CHLORIDE 0.9 % IV SOLN: INTRAVENOUS | Status: DC

## 2024-05-26 MED ORDER — SODIUM CHLORIDE 0.9 % IV SOLN: INTRAVENOUS | Status: AC

## 2024-05-26 MED ORDER — ENSURE PLUS HIGH PROTEIN PO LIQD
237.0000 mL | Freq: Three times a day (TID) | ORAL | Status: DC
  Administered 2024-05-26 – 2024-05-28 (×3): 237 mL via ORAL

## 2024-05-26 NOTE — Progress Notes (Addendum)
 [facility]                                                                              Brittany [name], is a 89 y.o. female, DOB - 06-10-1929, FMW:[id number] Admit date - 05/21/2024    Outpatient Primary MD for the patient is [name], MD  LOS - 4  days  Chief Complaint  Patient presents with   Fatigue       Brief summary   Patient is a 89 y.o. female retired [name] with borderline diabetes, HTN only on atenolol  was apparently brought to the ER by family due to lethargy, fatigue, lack of appetite.  Workup showed hemoglobin 4.0, stool Hemoccult exam is negative.  Patient denied any evidence of abdominal pain, nausea, hematochezia, melena or blood per rectum.  She has had colonoscopies many years ago and does not recall them ever being abnormal.  She denied any personal history of cancer, or GI bleeding.    Assessment & Plan       Symptomatic iron  deficiency anemia - Anemia profile showed iron  less than 10, TIBC 283, ferritin 43, B12 240, folate > 20 - Hb 4.0 on admission, s/p 3 units packed RBCs  - FOBT neg, GI consulted, awaiting recommendations - EGD 11/25 showed erythematous mucosa in the gastric fundus and gastric body, 2 cm hiatal hernia, gastric mucosal atrophy, few gastric polyps, status post biopsy - Colonoscopy 11/25 showed multiple colonic vascular lesions in the right colon versus changes from barotrauma, treated with APC, diverticulosis in the entire examined colon, 1 5-6 mm polyp at the hepatic plastic, not removed, 1 large polyp at the splenic flexure could be related to anemia, removed and clips placed, internal hemorrhoids. - H&H stable - Per GI, Dr. Leigh, she should be treated for H. pylori and will perform eradication testing as outpatient.  Recommended omeprazole , Pepto-Bismol, metronidazole , tetracycline  treatment for 14 days at discharge.     Essential hypertension, benign - BP stable, continue atenolol , decrease dose to 25 mg daily     B12 deficiency - B12 240, borderline started replacement, IM until discharge, transition to p.o. on discharge.  Acute urinary retention - Placed on Flomax  for 7 days, had I/O cath x 3, placed Foley catheter on 11/25 - CT abdomen ruled out any obstructive uropathy, hydronephrosis or any intra-abdominal mass or acute pathology. - Foley catheter removed on 11/27, now voiding  Borderline diabetes mellitus Hemoglobin A1c 6.2 on 12/18/2020, will recheck Not on any oral hypoglycemics  Hypernatremia Improving, sodium 139  Hypokalemia replace as needed  Mild dementia -  PT evaluation recommended SNF versus HHPT - CT head on 11/23 had hypodensities throughout the periventricular white matter consistent with chronic small vessel ischemic changes, no acute infarct or hemorrhage  Generalized debility  Obesity class 1  Estimated body mass index is 34.75 kg/m as calculated from the following:   Height as of this encounter: 5' (1.524 m).   Weight as of this encounter: 80.7 kg.  Code Status: Full code DVT Prophylaxis:  SCDs Start: 05/21/24 1553   Level of Care: Level of care: Progressive Family Communication: Updated patient's son, Brittany [name]  on 11/26 Disposition Plan:      Remains inpatient appropriate: DC held on 11/27 as patient was needing max assist for ambulation and PT recommended SNF.  TOC assisting with SNF.  Medically cleared for discharge once bed available  Procedures:  EGD Colonoscopy  Consultants:   Gastroenterology  Antimicrobials:   Anti-infectives (From admission, onward)    Start     Dose/Rate Route Frequency Ordered Stop   05/25/24 0000  metronidazole  (FLAGYL ) 375 MG capsule        375 mg Oral 3 times daily 05/25/24 1012 06/08/24 2359   05/25/24 0000  tetracycline  (SUMYCIN ) 500 MG capsule        500 mg Oral 4 times daily 05/25/24 1012 06/08/24 2359          Medications  atenolol   25 mg Oral Daily   Chlorhexidine  Gluconate Cloth  6 each Topical Daily    cyanocobalamin   1,000 mcg Intramuscular Daily   feeding supplement  237 mL Oral TID BM   Influenza vac split trivalent PF  0.5 mL Intramuscular Tomorrow-1000   pantoprazole   40 mg Oral Q0600   tamsulosin   0.4 mg Oral Daily      Subjective:   Brittany [name] was seen and examined today.  Alert and oriented, no acute complaints today.    Objective:   Vitals:   05/25/24 1300 05/25/24 2018 05/26/24 0509 05/26/24 1146  BP: 127/68 128/86 (!) 137/93 (!) 154/91  Pulse: (!) 59 62 63 70  Resp: 18 17 16    Temp: 99.4 F (37.4 C) 98 F (36.7 C) 98.9 F (37.2 C)   TempSrc: Oral Oral Oral   SpO2: 99% 100% 100%   Weight:      Height:        Intake/Output Summary (Last 24 hours) at 05/26/2024 1237 Last data filed at 05/26/2024 1234 Gross per 24 hour  Intake 919.19 ml  Output 750 ml  Net 169.19 ml     Wt Readings from Last 3 Encounters:  05/23/24 80.7 kg  03/20/22 78.9 kg  04/21/21 79 kg    Physical Exam General: Alert and oriented x 3, NAD Cardiovascular: S1 S2 clear, RRR.  Respiratory: CTAB, no wheezing Gastrointestinal: Soft, nontender, nondistended, NBS Ext: no pedal edema bilaterally Neuro: no new deficits Psych: Normal affect, pleasant, however has some underlying dementia      Data Reviewed:  I have personally reviewed following labs    CBC Lab Results  Component Value Date   WBC 10.0 05/26/2024   RBC 3.51 (L) 05/26/2024   HGB 9.4 (L) 05/26/2024   HCT 30.3 (L) 05/26/2024   MCV 86.3 05/26/2024   MCH 26.8 05/26/2024   PLT 196 05/26/2024   MCHC 31.0 05/26/2024   RDW 16.9 (H) 05/26/2024   LYMPHSABS 1.5 05/21/2024   MONOABS 0.9 05/21/2024   EOSABS 0.0 05/21/2024   BASOSABS 0.0 05/21/2024     Last metabolic panel Lab Results  Component Value Date   NA 139 05/26/2024   K 3.5 05/26/2024   CL 110 05/26/2024   CO2 18 (L) 05/26/2024   BUN 11 05/26/2024   CREATININE 1.07 (H) 05/26/2024   GLUCOSE 93 05/26/2024   GFRNONAA 48 (L) 05/26/2024   GFRAA 56  (L) 09/06/2018   CALCIUM  8.8 (L) 05/26/2024   PHOS 2.0 (L) 05/25/2024   PROT 6.8 05/21/2024   ALBUMIN 2.9 (L) 05/25/2024   BILITOT 0.3 05/21/2024   ALKPHOS 69 05/21/2024   AST 24 05/21/2024   ALT 9 05/21/2024  ANIONGAP 11 05/26/2024    CBG (last 3)  No results for input(s): GLUCAP in the last 72 hours.    Coagulation Profile: No results for input(s): INR, PROTIME in the last 168 hours.   Radiology Studies: I have personally reviewed the imaging studies  No results found.      Nydia Distance M.D. [facility] 05/26/2024, 12:37 PM  Available via Epic secure chat 7am-7pm After 7 pm, please refer to night coverage provider listed on amion.

## 2024-05-26 NOTE — Progress Notes (Signed)
 Physical Therapy Treatment Patient Details Name: Brittany [name] MRN: [id number] DOB: 1928/10/08 Today's Date: 05/26/2024   History of Present Illness 89 yo female admitted with anemia 05/21/24-s/p transfusion x 3 units, acute urinary retention. Hx of DM, breast Ca, oteoporosis, DJD, obesity    PT Comments  Pt making some progress.  Did have +2 available based on previous assessment but could transfer with assist of 1.  Does need mod-max cues.  Pt taking a few steps in room but leaving walker behind requiring HHA.  Limited distance due to fatigue and elevated BP.  Per note from nurse who spoke with pt's son - at baseline pt could ambulate in home some with RW.  She is below baseline and fall risk - continue to recommend Patient will benefit from continued inpatient follow up therapy, <3 hours/day at d/c.    If plan is discharge home, recommend the following: A lot of help with walking and/or transfers;A lot of help with bathing/dressing/bathroom;Assistance with cooking/housework;Help with stairs or ramp for entrance   Can travel by private vehicle        Equipment Recommendations  None recommended by PT    Recommendations for Other Services       Precautions / Restrictions Precautions Precautions: Fall     Mobility  Bed Mobility Overal bed mobility: Needs Assistance Bed Mobility: Supine to Sit     Supine to sit: Contact guard          Transfers Overall transfer level: Needs assistance Equipment used: Rolling walker (2 wheels) Transfers: Sit to/from Stand Sit to Stand: +2 safety/equipment, Min assist, Mod assist           General transfer comment: STS x 2 - initally mod A progressed to min A on second attempt.  Multimodal cues for hand placement    Ambulation/Gait Ambulation/Gait assistance: Min assist, +2 safety/equipment Gait Distance (Feet): 5 Feet Assistive device: Rolling walker (2 wheels), 2 person hand held assist Gait Pattern/deviations: Step-to pattern,  Decreased stride length, Trunk flexed Gait velocity: decreased     General Gait Details: Started wtih RW but then pt pushing away to reach for chair despite cues so switched to HHA.  Fatigued easily and elevated BP so only steps to chair.   Stairs             Wheelchair Mobility     Tilt Bed    Modified Rankin (Stroke Patients Only)       Balance Overall balance assessment: Needs assistance Sitting-balance support: No upper extremity supported Sitting balance-Leahy Scale: Good     Standing balance support: Bilateral upper extremity supported, During functional activity, Reliant on assistive device for balance Standing balance-Leahy Scale: Poor                              Communication    Cognition Arousal: Alert Behavior During Therapy: WFL for tasks assessed/performed   PT - Cognitive impairments: No family/caregiver present to determine baseline, Problem solving, Memory, Initiation                       PT - Cognition Comments: Pt oriented to self only Following commands: Impaired Following commands impaired: Only follows one step commands consistently    Cueing    Exercises General Exercises - Lower Extremity Ankle Circles/Pumps: AROM, Both, 10 reps, Seated Long Arc Quad: AROM, Both, 10 reps, Seated    General Comments General comments (skin  integrity, edema, etc.): BP was 181/102 at EOB and 160/106 in chair - notified RN.  HR 70's      Pertinent Vitals/Pain Pain Assessment Pain Assessment: No/denies pain    Home Living                          Prior Function            PT Goals (current goals can now be found in the care plan section) Progress towards PT goals: Progressing toward goals    Frequency    Min 2X/week      PT Plan      Co-evaluation              AM-PAC PT 6 Clicks Mobility   Outcome Measure  Help needed turning from your back to your side while in a flat bed without using  bedrails?: A Little Help needed moving from lying on your back to sitting on the side of a flat bed without using bedrails?: A Little Help needed moving to and from a bed to a chair (including a wheelchair)?: A Lot Help needed standing up from a chair using your arms (e.g., wheelchair or bedside chair)?: A Lot Help needed to walk in hospital room?: Total Help needed climbing 3-5 steps with a railing? : Total 6 Click Score: 12    End of Session Equipment Utilized During Treatment: Gait belt Activity Tolerance: Patient limited by fatigue (and elevated BP) Patient left: in chair;with call bell/phone within reach;with chair alarm set Nurse Communication: Mobility status PT Visit Diagnosis: Muscle weakness (generalized) (M62.81);Difficulty in walking, not elsewhere classified (R26.2)     Time: 8983-8968 PT Time Calculation (min) (ACUTE ONLY): 15 min  Charges:    $Gait Training: 8-22 mins PT General Charges $$ ACUTE PT VISIT: 1 Visit                     Benjiman, PT Acute Rehab Services [facility] [phone]    Benjiman VEAR Mulberry 05/26/2024, 1:06 PM

## 2024-05-26 NOTE — TOC Progression Note (Signed)
 Transition of Care [facility]) - Progression Note    Patient Details  Name: Brittany [name] MRN: [id number] Date of Birth: September 25, 1928  Transition of Care [facility]) CM/SW Contact  Nathin Saran, [place], RN Phone Number: 05/26/2024, 10:13 AM  Clinical Narrative: Patient/[name](Son) now agree to ST SNF-faxed out await bed offers,choice,then auth.      Expected Discharge Plan: Skilled Nursing Facility Barriers to Discharge: Continued Medical Work up               Expected Discharge Plan and Services   Discharge Planning Services: CM Consult Post Acute Care Choice: Skilled Nursing Facility Living arrangements for the past 2 months: Single Family Home Expected Discharge Date: 05/25/24                         HH Arranged: PT, OT HH Agency: [facility] Date [facility] Agency Contacted: 05/25/24 Time HH Agency Contacted: 1045 Representative spoke with at [facility] Agency: Amy   Social Drivers of Health (SDOH) Interventions SDOH Screenings   Food Insecurity: No Food Insecurity (05/21/2024)  Housing: Unknown (05/21/2024)  Transportation Needs: No Transportation Needs (05/21/2024)  Utilities: Not At Risk (05/21/2024)  Alcohol Screen: Low Risk  (03/20/2022)  Depression (PHQ2-9): Low Risk  (03/20/2022)  Financial Resource Strain: Low Risk  (03/20/2022)  Physical Activity: Inactive (03/20/2022)  Social Connections: Socially Isolated (05/21/2024)  Stress: No Stress Concern Present (03/20/2022)  Tobacco Use: Low Risk  (05/23/2024)    Readmission Risk Interventions     No data to display

## 2024-05-26 NOTE — NC FL2 (Signed)
 [place]  MEDICAID FL2 LEVEL OF CARE FORM     IDENTIFICATION  Patient Name: Brittany [name] Birthdate: 02-18-1929 Sex: female Admission Date (Current Location): 05/21/2024  [facility] and [place] Number:  Producer, Television/film/video and Address:         Provider Number: [id number]  Attending Physician Name and Address:  Davia Nydia POUR, MD  Relative Name and Phone Number:  Chanele Douglas [phone]    Current Level of Care: Hospital Recommended Level of Care: Skilled Nursing Facility Prior Approval Number:    Date Approved/Denied:   PASRR Number: [id number]  Discharge Plan: SNF    Current Diagnoses: Patient Active Problem List   Diagnosis Date Noted   Helicobacter pylori gastritis 05/24/2024   History of colonic polyps 05/24/2024   Gastric erythema 05/23/2024   Gastric polyp 05/23/2024   AVM (arteriovenous malformation) of colon 05/23/2024   Benign neoplasm of colon 05/23/2024   B12 deficiency 05/22/2024   Anemia 05/21/2024   Stage 3b chronic kidney disease (HCC) 09/06/2020   Urinary frequency 01/23/2019   Vitamin D  deficiency 04/15/2016   Anemia, iron  deficiency 04/15/2016   Middle insomnia 04/15/2016   Routine general medical examination at a health care facility 12/25/2013   Allergic rhinitis 04/15/2011   Type II diabetes mellitus with manifestations (HCC) 05/05/2007   Hyperlipidemia with target LDL less than 100 05/05/2007   Essential hypertension, benign 05/05/2007   OSTEOPOROSIS 05/05/2007    Orientation RESPIRATION BLADDER Height & Weight     Self, Time, Situation, Place  Normal Continent Weight: 80.7 kg Height:  5' (152.4 cm)  BEHAVIORAL SYMPTOMS/MOOD NEUROLOGICAL BOWEL NUTRITION STATUS      Continent Diet (Heart Healthy)  AMBULATORY STATUS COMMUNICATION OF NEEDS Skin   Limited Assist Verbally Normal                       Personal Care Assistance Level of Assistance  Bathing, Feeding, Dressing Bathing Assistance: Limited  assistance Feeding assistance: Limited assistance Dressing Assistance: Limited assistance     Functional Limitations Info  Sight, Hearing, Speech Sight Info: Impaired (eyeglasses) Hearing Info: Adequate Speech Info: Adequate    SPECIAL CARE FACTORS FREQUENCY  PT (By licensed PT), OT (By licensed OT)     PT Frequency: 5x week OT Frequency: 5x week            Contractures Contractures Info: Not present    Additional Factors Info  Code Status, Allergies Code Status Info: Full Allergies Info: Hctz (Hydrochlorothiazide ), Amlodipine  Besylate, Chlorthalidone , Indapamide , Olmesartan , Alendronate  Sodium, Lisinopril, Spironolactone , Bystolic  (Nebivolol  Hcl), Valsartan            Current Medications (05/26/2024):  This is the current hospital active medication list Current Facility-Administered Medications  Medication Dose Route Frequency Provider Last Rate Last Admin   0.9 %  sodium chloride  infusion   Intravenous Continuous Rai, Ripudeep K, MD 100 mL/hr at 05/26/24 0949 New Bag at 05/26/24 0949   acetaminophen  (TYLENOL ) tablet 650 mg  650 mg Oral Q6H PRN Zella Katha HERO, MD   650 mg at 05/25/24 1612   Or   acetaminophen  (TYLENOL ) suppository 650 mg  650 mg Rectal Q6H PRN Zella, Mir M, MD       albuterol  (PROVENTIL ) (2.5 MG/3ML) 0.083% nebulizer solution 2.5 mg  2.5 mg Nebulization Q2H PRN Zella, Mir M, MD       atenolol  (TENORMIN ) tablet 25 mg  25 mg Oral Daily Rai, Ripudeep K, MD   25 mg at 05/26/24 [phone]  Chlorhexidine  Gluconate Cloth 2 % PADS 6 each  6 each Topical Daily Rai, Ripudeep K, MD   6 each at 05/26/24 9062   cyanocobalamin  (VITAMIN B12) injection 1,000 mcg  1,000 mcg Intramuscular Daily Rai, Ripudeep K, MD   1,000 mcg at 05/26/24 0936   feeding supplement (ENSURE PLUS HIGH PROTEIN) liquid 237 mL  237 mL Oral TID BM Rai, Ripudeep K, MD   237 mL at 05/26/24 9062   Influenza vac split trivalent PF (FLUZONE HIGH-DOSE) injection 0.5 mL  0.5 mL Intramuscular  Tomorrow-1000 Zella, Mir M, MD       ondansetron  (ZOFRAN ) tablet 4 mg  4 mg Oral Q6H PRN Zella, Mir M, MD       Or   ondansetron  (ZOFRAN ) injection 4 mg  4 mg Intravenous Q6H PRN Zella, Mir M, MD       pantoprazole  (PROTONIX ) EC tablet 40 mg  40 mg Oral Q0600 Rai, Ripudeep K, MD   40 mg at 05/26/24 9383   tamsulosin  (FLOMAX ) capsule 0.4 mg  0.4 mg Oral Daily Rai, Ripudeep K, MD   0.4 mg at 05/26/24 9063   traZODone  (DESYREL ) tablet 25 mg  25 mg Oral QHS PRN Zella Katha HERO, MD         Discharge Medications: Please see discharge summary for a list of discharge medications.  Relevant Imaging Results:  Relevant Lab Results:   Additional Information ss#[id number] [place], [place]

## 2024-05-27 DIAGNOSIS — B9681 Helicobacter pylori [H. pylori] as the cause of diseases classified elsewhere: Secondary | ICD-10-CM

## 2024-05-27 DIAGNOSIS — D649 Anemia, unspecified: Secondary | ICD-10-CM | POA: Diagnosis not present

## 2024-05-27 DIAGNOSIS — E538 Deficiency of other specified B group vitamins: Secondary | ICD-10-CM | POA: Diagnosis not present

## 2024-05-27 DIAGNOSIS — K5520 Angiodysplasia of colon without hemorrhage: Secondary | ICD-10-CM | POA: Diagnosis not present

## 2024-05-27 DIAGNOSIS — K2970 Gastritis, unspecified, without bleeding: Secondary | ICD-10-CM

## 2024-05-27 LAB — CBC
HCT: 32.7 % — ABNORMAL LOW (ref 36.0–46.0)
Hemoglobin: 10.2 g/dL — ABNORMAL LOW (ref 12.0–15.0)
MCH: 26.9 pg (ref 26.0–34.0)
MCHC: 31.2 g/dL (ref 30.0–36.0)
MCV: 86.3 fL (ref 80.0–100.0)
Platelets: 232 K/uL (ref 150–400)
RBC: 3.79 MIL/uL — ABNORMAL LOW (ref 3.87–5.11)
RDW: 16.5 % — ABNORMAL HIGH (ref 11.5–15.5)
WBC: 10.9 K/uL — ABNORMAL HIGH (ref 4.0–10.5)
nRBC: 0.3 % — ABNORMAL HIGH (ref 0.0–0.2)

## 2024-05-27 LAB — BASIC METABOLIC PANEL WITH GFR
Anion gap: 11 (ref 5–15)
BUN: 14 mg/dL (ref 8–23)
CO2: 21 mmol/L — ABNORMAL LOW (ref 22–32)
Calcium: 9 mg/dL (ref 8.9–10.3)
Chloride: 108 mmol/L (ref 98–111)
Creatinine, Ser: 1.05 mg/dL — ABNORMAL HIGH (ref 0.44–1.00)
GFR, Estimated: 49 mL/min — ABNORMAL LOW (ref >60)
Glucose, Bld: 88 mg/dL (ref 70–99)
Potassium: 3.8 mmol/L (ref 3.5–5.1)
Sodium: 139 mmol/L (ref 135–145)

## 2024-05-27 MED ORDER — BISMUTH SUBSALICYLATE 262 MG PO CHEW
524.0000 mg | CHEWABLE_TABLET | Freq: Three times a day (TID) | ORAL | Status: DC
  Administered 2024-05-27 – 2024-05-29 (×7): 524 mg via ORAL
  Filled 2024-05-27 (×9): qty 2

## 2024-05-27 MED ORDER — HYDRALAZINE HCL 20 MG/ML IJ SOLN
10.0000 mg | Freq: Four times a day (QID) | INTRAMUSCULAR | Status: DC | PRN
  Filled 2024-05-27: qty 1

## 2024-05-27 MED ORDER — PANTOPRAZOLE SODIUM 40 MG PO TBEC
40.0000 mg | DELAYED_RELEASE_TABLET | Freq: Two times a day (BID) | ORAL | Status: DC
  Administered 2024-05-27 – 2024-05-29 (×4): 40 mg via ORAL
  Filled 2024-05-27 (×4): qty 1

## 2024-05-27 MED ORDER — TETRACYCLINE HCL 250 MG PO CAPS
500.0000 mg | ORAL_CAPSULE | Freq: Four times a day (QID) | ORAL | Status: DC
  Administered 2024-05-27 – 2024-05-29 (×8): 500 mg via ORAL
  Filled 2024-05-27 (×10): qty 2

## 2024-05-27 MED ORDER — HYDRALAZINE HCL 25 MG PO TABS
25.0000 mg | ORAL_TABLET | Freq: Three times a day (TID) | ORAL | Status: DC | PRN
  Administered 2024-05-27 (×2): 25 mg via ORAL
  Filled 2024-05-27 (×2): qty 1

## 2024-05-27 MED ORDER — METRONIDAZOLE 500 MG PO TABS
500.0000 mg | ORAL_TABLET | Freq: Three times a day (TID) | ORAL | Status: DC
  Administered 2024-05-27 – 2024-05-29 (×6): 500 mg via ORAL
  Filled 2024-05-27 (×6): qty 1

## 2024-05-27 MED ORDER — METRONIDAZOLE 500 MG PO TABS: 500.0000 mg | ORAL_TABLET | Freq: Two times a day (BID) | ORAL | Status: DC

## 2024-05-27 NOTE — Progress Notes (Addendum)
 [facility]                                                                              Brittany [name], is a 89 y.o. female, DOB - [birth year], FMW:[id number] Admit date - 05/21/2024    Outpatient Primary MD for the patient is [name], MD  LOS - 5  days  Chief Complaint  Patient presents with   Fatigue       Brief summary   Patient is a 89 y.o. female retired [name] with borderline diabetes, HTN only on atenolol  was apparently brought to the ER by family due to lethargy, fatigue, lack of appetite.  Workup showed hemoglobin 4.0, stool Hemoccult exam is negative.  Patient denied any evidence of abdominal pain, nausea, hematochezia, melena or blood per rectum.  She has had colonoscopies many years ago and does not recall them ever being abnormal.  She denied any personal history of cancer, or GI bleeding.   Awaiting placement to SNF  Assessment & Plan       Symptomatic iron  deficiency anemia - Anemia profile showed iron  less than 10, TIBC 283, ferritin 43, B12 240, folate > 20 - Hb 4.0 on admission, s/p 3 units packed RBCs  - FOBT neg, GI consulted, awaiting recommendations - EGD 11/25 showed erythematous mucosa in the gastric fundus and gastric body, 2 cm hiatal hernia, gastric mucosal atrophy, few gastric polyps, status post biopsy - Colonoscopy 11/25 showed multiple colonic vascular lesions in the right colon versus changes from barotrauma, treated with APC, diverticulosis in the entire examined colon, 1 5-6 mm polyp at the hepatic plastic, not removed, 1 large polyp at the splenic flexure could be related to anemia, removed and clips placed, internal hemorrhoids. - Hemoglobin stable at 10.2  H. pylori gastritis - Per GI, Dr. Leigh, she should be treated for H. pylori and will perform eradication testing as outpatient.  Recommended omeprazole , Pepto-Bismol, metronidazole , tetracycline  treatment for 14 days. - As patient is still waiting for discharge/ SNF,  awaiting start treatment of H. pylori gastritis as recommended by GI (note by Dr. Leigh on 11/26)     Essential hypertension, benign - Continue atenolol  25 mg daily - IV hydralazine  as needed with parameters    B12 deficiency - B12 240, borderline started replacement, IM until discharge, transition to p.o. on discharge.  Acute urinary retention - Placed on Flomax  for 7 days, had I/O cath x 3, placed Foley catheter on 11/25 - CT abdomen ruled out any obstructive uropathy, hydronephrosis or any intra-abdominal mass or acute pathology. - Foley catheter removed on 11/27, now voiding  Borderline diabetes mellitus Hemoglobin A1c 6.2 on 12/18/2020, will recheck Not on any oral hypoglycemics  Hypernatremia Resolved, sodium 139  Hypokalemia replace as needed  Mild dementia -  PT evaluation recommended SNF - CT head on 11/23 had hypodensities throughout the periventricular white matter consistent with chronic small vessel ischemic changes, no acute infarct or hemorrhage  Generalized debility - PT evaluation now recommended SNF  Obesity class 1  Estimated body mass index is 34.75 kg/m as calculated from the following:   Height as of this encounter:  5' (1.524 m).   Weight as of this encounter: 80.7 kg.  Code Status: Full code DVT Prophylaxis:  SCDs Start: 05/21/24 1553   Level of Care: Level of care: Med-Surg Family Communication: Updated patient's son, Rossie Scarfone on 11/26 Disposition Plan:      Remains inpatient appropriate: DC held on 11/27 as patient was needing max assist for ambulation and PT recommended SNF.  TOC assisting with SNF.  Medically cleared for discharge once bed available  Procedures:  EGD Colonoscopy  Consultants:   Gastroenterology  Antimicrobials:   Anti-infectives (From admission, onward)    Start     Dose/Rate Route Frequency Ordered Stop   05/25/24 0000  metronidazole  (FLAGYL ) 375 MG capsule        375 mg Oral 3 times daily 05/25/24 1012  06/08/24 2359   05/25/24 0000  tetracycline  (SUMYCIN ) 500 MG capsule        500 mg Oral 4 times daily 05/25/24 1012 06/08/24 2359          Medications  atenolol   25 mg Oral Daily   Chlorhexidine  Gluconate Cloth  6 each Topical Daily   cyanocobalamin   1,000 mcg Intramuscular Daily   feeding supplement  237 mL Oral TID BM   Influenza vac split trivalent PF  0.5 mL Intramuscular Tomorrow-1000   pantoprazole   40 mg Oral Q0600   tamsulosin   0.4 mg Oral Daily      Subjective:   [name] was seen and examined today.  No acute complaints, pleasant and cooperative.  Tolerating diet, no acute issues. Objective:   Vitals:   05/26/24 1146 05/26/24 1315 05/26/24 2203 05/27/24 0427  BP: (!) 154/91 (!) 145/80 (!) 175/94 (!) 160/92  Pulse: 70 69 72 69  Resp:  18 18 18   Temp:  97.8 F (36.6 C) 99.3 F (37.4 C) 98.9 F (37.2 C)  TempSrc:    Oral  SpO2:  100% 100% 100%  Weight:      Height:        Intake/Output Summary (Last 24 hours) at 05/27/2024 1213 Last data filed at 05/26/2024 2000 Gross per 24 hour  Intake 600.05 ml  Output 650 ml  Net -49.95 ml     Wt Readings from Last 3 Encounters:  05/23/24 80.7 kg  03/20/22 78.9 kg  04/21/21 79 kg   Physical Exam General: Alert and oriented x 3, NAD Cardiovascular: S1 S2 clear, RRR.  Respiratory: CTAB Gastrointestinal: Soft, nontender, nondistended, NBS Ext: no pedal edema bilaterally Neuro: no new deficits Psych: Normal affect, pleasant       Data Reviewed:  I have personally reviewed following labs    CBC Lab Results  Component Value Date   WBC 10.9 (H) 05/27/2024   RBC 3.79 (L) 05/27/2024   HGB 10.2 (L) 05/27/2024   HCT 32.7 (L) 05/27/2024   MCV 86.3 05/27/2024   MCH 26.9 05/27/2024   PLT 232 05/27/2024   MCHC 31.2 05/27/2024   RDW 16.5 (H) 05/27/2024   LYMPHSABS 1.5 05/21/2024   MONOABS 0.9 05/21/2024   EOSABS 0.0 05/21/2024   BASOSABS 0.0 05/21/2024     Last metabolic panel Lab Results   Component Value Date   NA 139 05/27/2024   K 3.8 05/27/2024   CL 108 05/27/2024   CO2 21 (L) 05/27/2024   BUN 14 05/27/2024   CREATININE 1.05 (H) 05/27/2024   GLUCOSE 88 05/27/2024   GFRNONAA 49 (L) 05/27/2024   GFRAA 56 (L) 09/06/2018   CALCIUM  9.0 05/27/2024  PHOS 2.0 (L) 05/25/2024   PROT 6.8 05/21/2024   ALBUMIN 2.9 (L) 05/25/2024   BILITOT 0.3 05/21/2024   ALKPHOS 69 05/21/2024   AST 24 05/21/2024   ALT 9 05/21/2024   ANIONGAP 11 05/27/2024    CBG (last 3)  No results for input(s): GLUCAP in the last 72 hours.    Coagulation Profile: No results for input(s): INR, PROTIME in the last 168 hours.   Radiology Studies: I have personally reviewed the imaging studies  No results found.      Nydia Distance M.D. [facility] 05/27/2024, 12:13 PM  Available via Epic secure chat 7am-7pm After 7 pm, please refer to night coverage provider listed on amion.

## 2024-05-28 ENCOUNTER — Ambulatory Visit: Payer: Self-pay | Admitting: Gastroenterology

## 2024-05-28 DIAGNOSIS — K317 Polyp of stomach and duodenum: Secondary | ICD-10-CM

## 2024-05-28 DIAGNOSIS — K5520 Angiodysplasia of colon without hemorrhage: Secondary | ICD-10-CM | POA: Diagnosis not present

## 2024-05-28 DIAGNOSIS — E538 Deficiency of other specified B group vitamins: Secondary | ICD-10-CM | POA: Diagnosis not present

## 2024-05-28 DIAGNOSIS — Z860100 Personal history of colon polyps, unspecified: Secondary | ICD-10-CM

## 2024-05-28 DIAGNOSIS — D509 Iron deficiency anemia, unspecified: Secondary | ICD-10-CM | POA: Diagnosis not present

## 2024-05-28 DIAGNOSIS — D649 Anemia, unspecified: Secondary | ICD-10-CM | POA: Diagnosis not present

## 2024-05-28 LAB — BASIC METABOLIC PANEL WITH GFR
Anion gap: 13 (ref 5–15)
BUN: 16 mg/dL (ref 8–23)
CO2: 19 mmol/L — ABNORMAL LOW (ref 22–32)
Calcium: 9 mg/dL (ref 8.9–10.3)
Chloride: 106 mmol/L (ref 98–111)
Creatinine, Ser: 1 mg/dL (ref 0.44–1.00)
GFR, Estimated: 52 mL/min — ABNORMAL LOW (ref >60)
Glucose, Bld: 83 mg/dL (ref 70–99)
Potassium: 3.4 mmol/L — ABNORMAL LOW (ref 3.5–5.1)
Sodium: 139 mmol/L (ref 135–145)

## 2024-05-28 LAB — CBC
HCT: 33.8 % — ABNORMAL LOW (ref 36.0–46.0)
Hemoglobin: 10.6 g/dL — ABNORMAL LOW (ref 12.0–15.0)
MCH: 27 pg (ref 26.0–34.0)
MCHC: 31.4 g/dL (ref 30.0–36.0)
MCV: 86.2 fL (ref 80.0–100.0)
Platelets: 262 K/uL (ref 150–400)
RBC: 3.92 MIL/uL (ref 3.87–5.11)
RDW: 17 % — ABNORMAL HIGH (ref 11.5–15.5)
WBC: 9.1 K/uL (ref 4.0–10.5)
nRBC: 0.2 % (ref 0.0–0.2)

## 2024-05-28 MED ORDER — VITAMIN B-12 1000 MCG PO TABS
1000.0000 ug | ORAL_TABLET | Freq: Every day | ORAL | Status: DC
  Administered 2024-05-29: 1000 ug via ORAL
  Filled 2024-05-28: qty 1

## 2024-05-28 MED ORDER — POTASSIUM CHLORIDE CRYS ER 20 MEQ PO TBCR
60.0000 meq | EXTENDED_RELEASE_TABLET | Freq: Once | ORAL | Status: AC
  Administered 2024-05-28: 60 meq via ORAL
  Filled 2024-05-28: qty 3

## 2024-05-28 NOTE — Progress Notes (Signed)
 PROGRESS NOTE  Brittany [name] FMW:[id number] DOB: Dec 17, 1928   PCP: [name], MD  Patient is from: Home.  DOA: 05/21/2024 LOS: 6  Chief complaints Chief Complaint  Patient presents with   Fatigue     Brief Narrative / Interim history: 89 y.o. F who is retired [name] with cognitive impairment, prediabetes and HTN brought to ED by family due to lethargy, fatigue and lack of appetite and admitted with severe symptomatic iron  deficiency anemia.    In ED, Hgb 4.0.  No report of melena or hematochezia.  Not on antiplatelets or anticoagulation.  Hemoccult negative.    Patient was transfused 4 units and hemoglobin improved.  Anemia panel with some degree of iron  deficiency.  EGD on 11/25 showed gastritis and gastric polyps (biopsied).  Colonoscopy on 11/25 showed multiple colonic vascular lesions in the right colon versus changes from barotrauma (treated with APC) diverticulosis in the entire examined colon, one 5 to 6 mm polyp at the hepatic flexure and 1 large polyp at the splenic flexure (removed and clips placed) and internal hemorrhoid.  H. pylori testing came back positive, patient was started on treatment.  Therapy recommended SNF.  Medically stable for discharge.  Subjective: Seen and examined earlier this morning.  No major events overnight or this morning.  No complaints but not a great historian.  She is awake but only oriented to self and hospital.   Assessment and plan: Symptomatic iron  deficiency anemia: Hgb 4.0.  Hemoccult negative.  EGD and colonoscopy as above.  Transfused a total of 4 units of 4.  Anemia panel with some degree of iron  deficiency. Recent Labs    05/21/24 1419 05/22/24 0503 05/23/24 0507 05/24/24 0530 05/25/24 0507 05/26/24 0619 05/27/24 0439 05/28/24 0521  HGB 4.0* 6.6* 8.0* 7.7* 9.4* 9.4* 10.2* 10.6*  - Continue monitor  H. pylori gastritis -Started on PPI, Pepto-Bismol, Flagyl , tetracycline  on 11/29 for a total of 14 days -Outpatient  follow-up with GI   Essential hypertension, benign: SBP 140s.  History of intolerance to multiple antihypertensive meds -Continue atenolol  25 mg daily -IV hydralazine  as needed with parameters   B12 deficiency: B12 240.  Received vitamin B12 injection for 7 days - Changed to p.o.   Acute urinary retention: Resolved.  Passed voiding trial on 11/27. -Continue Flomax    Hypokalemia -Monitor replenish K and Mg as appropriate     Major cognitive impairment: Awake but only oriented to self and hospital -Reorientation and delirium precaution -Minimize avoid sedating medications   Generalized debility - PT evaluation now recommended SNF  Prediabetes: Not on meds.  A1c 4.9%.  Hypernatremia: Resolved.  Obesity class 1  Body mass index is 34.75 kg/m.          DVT prophylaxis:  SCDs Start: 05/21/24 1553  Code Status: Full code Family Communication: None at the bedside Level of care: Med-Surg Status is: Inpatient Remains inpatient appropriate because: SNF   Final disposition: SNF   35 minutes with more than 50% spent in reviewing records, counseling patient/family and coordinating care.  Consultants:  Gastroenterology  Procedures: EGD on 11/25 showed gastritis and gastric polyps (biopsied).    Colonoscopy on 11/25 showed multiple colonic vascular lesions in the right colon versus changes from barotrauma (treated with APC) diverticulosis in the entire examined colon, one 5 to 6 mm polyp at the hepatic flexure and 1 large polyp at the splenic flexure (removed and clips placed) and internal hemorrhoid.    Microbiology summarized: None  Objective: Vitals:  05/27/24 1330 05/27/24 2155 05/27/24 2324 05/28/24 0452  BP: (!) 173/96 (!) 168/104 (!) 149/88 (!) 146/84  Pulse: 67 66 63 68  Resp: 18 16  16   Temp: 99.2 F (37.3 C) 99 F (37.2 C)  98.3 F (36.8 C)  TempSrc: Oral Oral  Oral  SpO2: 100% 100%  100%  Weight:      Height:        Examination:  GENERAL:  No apparent distress.  Nontoxic. HEENT: MMM.  Vision and hearing grossly intact.  NECK: Supple.  No apparent JVD.  RESP:  No IWOB.  Fair aeration bilaterally. CVS:  RRR. Heart sounds normal.  ABD/GI/GU: BS+. Abd soft, NTND.  MSK/EXT:  Moves extremities. No apparent deformity. No edema.  SKIN: no apparent skin lesion or wound NEURO: AA.  Oriented to self and hospital.  No apparent focal neuro deficit. PSYCH: Calm. Normal affect.   Sch Meds:  Scheduled Meds:  atenolol   25 mg Oral Daily   bismuth  subsalicylate  524 mg Oral TID AC & HS   Chlorhexidine  Gluconate Cloth  6 each Topical Daily   feeding supplement  237 mL Oral TID BM   Influenza vac split trivalent PF  0.5 mL Intramuscular Tomorrow-1000   metroNIDAZOLE   500 mg Oral Q8H   pantoprazole   40 mg Oral BID AC   tamsulosin   0.4 mg Oral Daily   tetracycline   500 mg Oral QID   Continuous Infusions: PRN Meds:.acetaminophen  **OR** acetaminophen , albuterol , hydrALAZINE , ondansetron  **OR** ondansetron  (ZOFRAN ) IV, traZODone   Antimicrobials: Anti-infectives (From admission, onward)    Start     Dose/Rate Route Frequency Ordered Stop   05/27/24 1400  tetracycline  (SUMYCIN ) capsule 500 mg        500 mg Oral 4 times daily 05/27/24 1220 06/10/24 1359   05/27/24 1245  metroNIDAZOLE  (FLAGYL ) tablet 500 mg  Status:  Discontinued        500 mg Oral Every 12 hours 05/27/24 1220 05/27/24 1238   05/27/24 1238  metroNIDAZOLE  (FLAGYL ) tablet 500 mg        500 mg Oral Every 8 hours 05/27/24 1238 06/09/24 1359   05/25/24 0000  metronidazole  (FLAGYL ) 375 MG capsule        375 mg Oral 3 times daily 05/25/24 1012 06/08/24 2359   05/25/24 0000  tetracycline  (SUMYCIN ) 500 MG capsule        500 mg Oral 4 times daily 05/25/24 1012 06/08/24 2359        I have personally reviewed the following labs and images: CBC: Recent Labs  Lab 05/21/24 1419 05/22/24 0503 05/24/24 0530 05/25/24 0507 05/26/24 0619 05/27/24 0439 05/28/24 0521  WBC 9.9    < > 11.2* 10.4 10.0 10.9* 9.1  NEUTROABS 7.4  --   --   --   --   --   --   HGB 4.0*   < > 7.7* 9.4* 9.4* 10.2* 10.6*  HCT 14.3*   < > 25.4* 29.3* 30.3* 32.7* 33.8*  MCV 91.1   < > 90.1 85.4 86.3 86.3 86.2  PLT 286   < > 200 180 196 232 262   < > = values in this interval not displayed.   BMP &GFR Recent Labs  Lab 05/23/24 0507 05/24/24 0530 05/25/24 0507 05/26/24 0619 05/27/24 0439 05/28/24 0521  NA 149* 144 140 139 139 139  K 3.7 3.3* 3.7 3.5 3.8 3.4*  CL 118* 113* 112* 110 108 106  CO2 17* 20* 19* 18* 21* 19*  GLUCOSE  114* 110* 90 93 88 83  BUN 15 11 12 11 14 16   CREATININE 1.19* 1.04* 1.10* 1.07* 1.05* 1.00  CALCIUM  8.7* 8.2* 8.3* 8.8* 9.0 9.0  PHOS 2.4* 2.2* 2.0*  --   --   --    Estimated Creatinine Clearance: 33.1 mL/min (by C-G formula based on SCr of 1 mg/dL). Liver & Pancreas: Recent Labs  Lab 05/21/24 1419 05/23/24 0507 05/24/24 0530 05/25/24 0507  AST 24  --   --   --   ALT 9  --   --   --   ALKPHOS 69  --   --   --   BILITOT 0.3  --   --   --   PROT 6.8  --   --   --   ALBUMIN 3.5 3.3* 3.0* 2.9*   No results for input(s): LIPASE, AMYLASE in the last 168 hours. Recent Labs  Lab 05/21/24 1417  AMMONIA 21   Diabetic: No results for input(s): HGBA1C in the last 72 hours. No results for input(s): GLUCAP in the last 168 hours. Cardiac Enzymes: No results for input(s): CKTOTAL, CKMB, CKMBINDEX, TROPONINI in the last 168 hours. No results for input(s): PROBNP in the last 8760 hours. Coagulation Profile: No results for input(s): INR, PROTIME in the last 168 hours. Thyroid  Function Tests: No results for input(s): TSH, T4TOTAL, FREET4, T3FREE, THYROIDAB in the last 72 hours. Lipid Profile: No results for input(s): CHOL, HDL, LDLCALC, TRIG, CHOLHDL, LDLDIRECT in the last 72 hours. Anemia Panel: No results for input(s): VITAMINB12, FOLATE, FERRITIN, TIBC, IRON , RETICCTPCT in the last 72 hours. Urine  analysis:    Component Value Date/Time   COLORURINE YELLOW 05/21/2024 1435   APPEARANCEUR CLEAR 05/21/2024 1435   LABSPEC 1.016 05/21/2024 1435   PHURINE 5.0 05/21/2024 1435   GLUCOSEU NEGATIVE 05/21/2024 1435   GLUCOSEU NEGATIVE 01/23/2019 1058   HGBUR NEGATIVE 05/21/2024 1435   BILIRUBINUR NEGATIVE 05/21/2024 1435   KETONESUR NEGATIVE 05/21/2024 1435   PROTEINUR NEGATIVE 05/21/2024 1435   UROBILINOGEN 0.2 01/23/2019 1058   NITRITE NEGATIVE 05/21/2024 1435   LEUKOCYTESUR NEGATIVE 05/21/2024 1435   Sepsis Labs: Invalid input(s): PROCALCITONIN, LACTICIDVEN  Microbiology: No results found for this or any previous visit (from the past 240 hours).  Radiology Studies: No results found.    Dashae Wilcher T. Teryl Mcconaghy [facility]  If 7PM-7AM, please contact night-coverage [email or web address] 05/28/2024, 10:50 AM

## 2024-05-28 NOTE — Plan of Care (Signed)
   Problem: Health Behavior/Discharge Planning: Goal: Ability to manage health-related needs will improve Outcome: Progressing   Problem: Clinical Measurements: Goal: Ability to maintain clinical measurements within normal limits will improve Outcome: Progressing Goal: Will remain free from infection Outcome: Progressing Goal: Diagnostic test results will improve Outcome: Progressing Goal: Respiratory complications will improve Outcome: Progressing

## 2024-05-28 NOTE — Progress Notes (Signed)
 Mobility Specialist - Progress Note   05/28/24 1244  Mobility  Activity Pivoted/transferred from bed to chair  Level of Assistance +2 (takes two people)  Press Photographer wheel walker  Range of Motion/Exercises Active  Activity Response Tolerated well  Mobility visit 1 Mobility  Mobility Specialist Start Time (ACUTE ONLY) 1235  Mobility Specialist Stop Time (ACUTE ONLY) 1244  Mobility Specialist Time Calculation (min) (ACUTE ONLY) 9 min   Pt was found in bed and agreeable to mobilize. No complaints. At EOS was left on recliner chair with all needs met. Call bell in reach and chair alarm on.   Erminio Leos,  Mobility Specialist Can be reached via Secure Chat

## 2024-05-29 ENCOUNTER — Telehealth: Payer: Self-pay

## 2024-05-29 DIAGNOSIS — D509 Iron deficiency anemia, unspecified: Secondary | ICD-10-CM

## 2024-05-29 DIAGNOSIS — K319 Disease of stomach and duodenum, unspecified: Secondary | ICD-10-CM | POA: Diagnosis not present

## 2024-05-29 DIAGNOSIS — K5520 Angiodysplasia of colon without hemorrhage: Secondary | ICD-10-CM | POA: Diagnosis not present

## 2024-05-29 DIAGNOSIS — K317 Polyp of stomach and duodenum: Secondary | ICD-10-CM | POA: Diagnosis not present

## 2024-05-29 NOTE — Telephone Encounter (Signed)
 Patient had CBC yesterday, Hemoglobin of 10.6

## 2024-05-29 NOTE — Telephone Encounter (Signed)
-----   Message from Brittany [name] sent at 05/24/2024  5:22 PM EST ----- Regarding: follow up labs [name] can you please order hemoglobin for this patient for next week and contact family to make sure she comes in to get this done?  Thanks

## 2024-05-29 NOTE — Discharge Summary (Signed)
 Physician Discharge Summary  Brittany [name] FMW:[id number] DOB: December 17, 1928 DOA: 05/21/2024  PCP: [name], MD  Admit date: 05/21/2024 Discharge date: 05/29/24  Admitted From: Home Disposition: SNF Recommendations for Outpatient Follow-up:   Outpatient follow-up with gastroenterology as below Check CMP and CBC in 1 week Please follow up on the following pending results: None   Discharge Condition: Stable CODE STATUS: Full code Diet Orders (From admission, onward)     Start     Ordered   05/23/24 1736  Diet Heart Room service appropriate? No; Fluid consistency: Thin  Diet effective now       Question Answer Comment  Room service appropriate? No   Fluid consistency: Thin      05/23/24 1735             Follow-up Information     Armbruster, Elspeth SQUIBB, MD. Schedule an appointment as soon as possible for a visit in 2 week(s).   Specialty: Gastroenterology Why: for hospital follow-up Contact information: [address] [place] [address] [phone]                 Hospital course 89 y.o. F who is retired [name] with cognitive impairment, prediabetes and HTN brought to ED by family due to lethargy, fatigue and lack of appetite and admitted with severe symptomatic iron  deficiency anemia.     In ED, Hgb 4.0.  No report of melena or hematochezia.  Not on antiplatelets or anticoagulation.  Hemoccult negative.     Patient was transfused 4 units and hemoglobin improved.  Anemia panel with some degree of iron  deficiency.  EGD on 11/25 showed gastritis and gastric polyps (biopsied).  Colonoscopy on 11/25 showed multiple colonic vascular lesions in the right colon versus changes from barotrauma (treated with APC) diverticulosis in the entire examined colon, one 5 to 6 mm polyp at the hepatic flexure and 1 large polyp at the splenic flexure (removed and clips placed) and internal hemorrhoid.  H. pylori testing came back positive, patient was started on  treatment with PPI, Pepto-Bismol, Flagyl  and tetracycline  on 11/29 for 14 days.  Outpatient follow-up with GI as above.   Therapy recommended SNF.   See individual problem list below for more.   Problems addressed during this hospitalization Symptomatic iron  deficiency anemia: Hgb 4.0.  Hemoccult negative.  EGD and colonoscopy as above.  Transfused a total of 4 units and hemoglobin improved to 10.6.  Anemia panel with some degree of iron  deficiency. -Check CBC in 1 week    H. pylori gastritis -Started on PPI, Pepto-Bismol, Flagyl , tetracycline  on 11/29 for a total of 14 days -Outpatient follow-up with GI as above.   Essential hypertension, benign: SBP 140s.  History of intolerance to multiple antihypertensive meds -Continue atenolol  25 mg daily   B12 deficiency: B12 240.  Received vitamin B12 injection for 7 days - Changed to p.o.   Acute urinary retention: Resolved.  Passed voiding trial on 11/27. -Continue Flomax    Hypokalemia -Monitor replenish K and Mg as appropriate     Major cognitive impairment: Awake but only oriented to self and hospital -Reorientation and delirium precaution -Minimize avoid sedating medications   Generalized debility - PT/OT recommended SNF   Prediabetes: Not on meds.  A1c 4.9%.   Hypernatremia: Resolved.   Obesity class 1  Body mass index is 34.75 kg/m.           Consultations: Gastroenterology  Time spent 35  minutes  Vital signs Vitals:  05/28/24 0452 05/28/24 1311 05/28/24 1930 05/29/24 0438  BP: (!) 146/84 132/74 127/86 131/85  Pulse: 68 64 73 69  Temp: 98.3 F (36.8 C) 99.5 F (37.5 C) 99.8 F (37.7 C) 98.5 F (36.9 C)  Resp: 16 18 15 14   Height:      Weight:      SpO2: 100% 97% 99% 100%  TempSrc: Oral Oral Oral Oral  BMI (Calculated):         Discharge exam GENERAL: No apparent distress.  Nontoxic. HEENT: MMM.  Vision and hearing grossly intact.  NECK: Supple.  No apparent JVD.  RESP:  No IWOB.  Fair  aeration bilaterally. CVS:  RRR. Heart sounds normal.  ABD/GI/GU: BS+. Abd soft, NTND.  MSK/EXT:  Moves extremities. No apparent deformity. No edema.  SKIN: no apparent skin lesion or wound NEURO: AA.  Oriented to self and hospital.  No apparent focal neuro deficit. PSYCH: Calm. Normal affect.    Discharge Instructions Discharge Instructions     Increase activity slowly   Complete by: As directed       Allergies as of 05/29/2024       Reactions   Hctz [hydrochlorothiazide ] Other (See Comments)   Reactions: sweating, heart pain, itching   Amlodipine  Besylate    Muscle aches   Chlorthalidone  Other (See Comments)   Muscle aches   Indapamide  Hives, Itching   Olmesartan     headache   Alendronate  Sodium    REACTION: heartburn   Lisinopril    REACTION: dizziness   Spironolactone  Itching   Bystolic  [nebivolol  Hcl] Itching, Rash   Valsartan     itching        Medication List     TAKE these medications    atenolol  25 MG tablet Commonly known as: TENORMIN  Take 1 tablet (25 mg total) by mouth daily. What changed:  medication strength how much to take   bismuth  subsalicylate 262 MG chewable tablet Commonly known as: Pepto-Bismol Chew 2 tablets (524 mg total) by mouth 4 (four) times daily -  before meals and at bedtime for 14 days.   cyanocobalamin  1000 MCG tablet Commonly known as: VITAMIN B12 Take 1 tablet (1,000 mcg total) by mouth daily.   metronidazole  375 MG capsule Commonly known as: Flagyl  Take 1 capsule (375 mg total) by mouth 3 (three) times daily for 14 days.   omeprazole  20 MG tablet Commonly known as: PriLOSEC  OTC Take 1 tablet (20 mg total) by mouth 2 (two) times daily for 14 days.   ondansetron  4 MG tablet Commonly known as: ZOFRAN  Take 1 tablet (4 mg total) by mouth every 6 (six) hours as needed for nausea.   tamsulosin  0.4 MG Caps capsule Commonly known as: FLOMAX  Take 1 capsule (0.4 mg total) by mouth daily for 7 days.   tetracycline   500 MG capsule Commonly known as: SUMYCIN  Take 1 capsule (500 mg total) by mouth 4 (four) times daily for 14 days.         Procedures/Studies: EGD on 11/25 showed gastritis and gastric polyps (biopsied).    Colonoscopy on 11/25 showed multiple colonic vascular lesions in the right colon versus changes from barotrauma (treated with APC) diverticulosis in the entire examined colon, one 5 to 6 mm polyp at the hepatic flexure and 1 large polyp at the splenic flexure (removed and clips placed) and internal hemorrhoid.     CT ABDOMEN PELVIS WO CONTRAST Result Date: 05/23/2024 CLINICAL DATA:  Acute abdominal pain. Urinary retention. Severe anemia. EXAM: CT ABDOMEN AND  PELVIS WITHOUT CONTRAST TECHNIQUE: Multidetector CT imaging of the abdomen and pelvis was performed following the standard protocol without IV contrast. RADIATION DOSE REDUCTION: This exam was performed according to the departmental dose-optimization program which includes automated exposure control, adjustment of the mA and/or kV according to patient size and/or use of iterative reconstruction technique. COMPARISON:  None Available. FINDINGS: Lower chest: No acute findings. Hepatobiliary: Multiple hepatic cysts are noted, largest in segment 3 of left lobe measuring 6 cm. No definite mass visualized on this unenhanced exam. Gallbladder is unremarkable. No evidence of biliary ductal dilatation. Pancreas: No mass or inflammatory process visualized on this unenhanced exam. Spleen:  Within normal limits in size. Adrenals/Urinary tract: Small bilateral low-attenuation adrenal masses are seen, consistent with benign adenomas. A few fluid attenuation renal cysts are noted bilaterally. (No followup imaging is recommended). No evidence of urolithiasis or hydronephrosis. Unremarkable unopacified urinary bladder. Stomach/Bowel: Small hiatal hernia. No evidence of obstruction, inflammatory process, or abnormal fluid collections. Extensive colonic  diverticulosis without signs of diverticulitis. Vascular/Lymphatic: No pathologically enlarged lymph nodes identified. No evidence of abdominal aortic aneurysm. Reproductive:  No mass or other significant abnormality. Other:  Small fat-containing umbilical hernia. Musculoskeletal:  No suspicious bone lesions identified. IMPRESSION: No evidence of urolithiasis, hydronephrosis, or other acute findings. Extensive colonic diverticulosis, without radiographic evidence of diverticulitis. Small hiatal hernia.  Small fat-containing umbilical hernia. Electronically Signed   By: Norleen DELENA Kil M.D.   On: 05/23/2024 10:44   CT HEAD WO CONTRAST Result Date: 05/21/2024 CLINICAL DATA:  Altered level of consciousness EXAM: CT HEAD WITHOUT CONTRAST TECHNIQUE: Contiguous axial images were obtained from the base of the skull through the vertex without intravenous contrast. RADIATION DOSE REDUCTION: This exam was performed according to the departmental dose-optimization program which includes automated exposure control, adjustment of the mA and/or kV according to patient size and/or use of iterative reconstruction technique. COMPARISON:  None Available. FINDINGS: Brain: Scattered hypodensities throughout the periventricular white matter consistent with chronic small vessel ischemic changes. No evidence of acute infarct or hemorrhage. The lateral ventricles and midline structures appear unremarkable. No acute extra-axial fluid collections. No mass effect. Vascular: No hyperdense vessel or unexpected calcification. Diffuse atherosclerosis. Skull: Normal. Negative for fracture or focal lesion. Sinuses/Orbits: No acute finding. Other: None. IMPRESSION: 1. No acute intracranial process. Electronically Signed   By: Ozell Daring M.D.   On: 05/21/2024 15:42   DG Chest Port 1 View Result Date: 05/21/2024 EXAM: 1 VIEW(S) XRAY OF THE CHEST 05/21/2024 02:24:00 PM COMPARISON: CT chest 05/16/2015. CLINICAL HISTORY: weakness FINDINGS: LUNGS  AND PLEURA: Shallow inspiration. No focal pulmonary opacity. No pleural effusion. No pneumothorax. HEART AND MEDIASTINUM: Cardiac enlargement. Dilated, calcified, and tortuous aorta. BONES AND SOFT TISSUES: Degenerative changes in the spine and shoulders. No acute osseous abnormality. IMPRESSION: 1. No acute cardiopulmonary process identified. 2. Cardiomegaly with a dilated, calcified, and tortuous aorta. Electronically signed by: Elsie Gravely MD 05/21/2024 02:29 PM EST RP Workstation: [id number]       The results of significant diagnostics from this hospitalization (including imaging, microbiology, ancillary and laboratory) are listed below for reference.     Microbiology: No results found for this or any previous visit (from the past 240 hours).   Labs:  CBC: Recent Labs  Lab 05/24/24 0530 05/25/24 0507 05/26/24 0619 05/27/24 0439 05/28/24 0521  WBC 11.2* 10.4 10.0 10.9* 9.1  HGB 7.7* 9.4* 9.4* 10.2* 10.6*  HCT 25.4* 29.3* 30.3* 32.7* 33.8*  MCV 90.1 85.4 86.3 86.3 86.2  PLT 200 180  196 232 262   BMP &GFR Recent Labs  Lab 05/23/24 0507 05/24/24 0530 05/25/24 0507 05/26/24 0619 05/27/24 0439 05/28/24 0521  NA 149* 144 140 139 139 139  K 3.7 3.3* 3.7 3.5 3.8 3.4*  CL 118* 113* 112* 110 108 106  CO2 17* 20* 19* 18* 21* 19*  GLUCOSE 114* 110* 90 93 88 83  BUN 15 11 12 11 14 16   CREATININE 1.19* 1.04* 1.10* 1.07* 1.05* 1.00  CALCIUM  8.7* 8.2* 8.3* 8.8* 9.0 9.0  PHOS 2.4* 2.2* 2.0*  --   --   --    Estimated Creatinine Clearance: 33.1 mL/min (by C-G formula based on SCr of 1 mg/dL). Liver & Pancreas: Recent Labs  Lab 05/23/24 0507 05/24/24 0530 05/25/24 0507  ALBUMIN 3.3* 3.0* 2.9*   No results for input(s): LIPASE, AMYLASE in the last 168 hours. No results for input(s): AMMONIA in the last 168 hours. Diabetic: No results for input(s): HGBA1C in the last 72 hours. No results for input(s): GLUCAP in the last 168 hours. Cardiac Enzymes: No results  for input(s): CKTOTAL, CKMB, CKMBINDEX, TROPONINI in the last 168 hours. No results for input(s): PROBNP in the last 8760 hours. Coagulation Profile: No results for input(s): INR, PROTIME in the last 168 hours. Thyroid  Function Tests: No results for input(s): TSH, T4TOTAL, FREET4, T3FREE, THYROIDAB in the last 72 hours. Lipid Profile: No results for input(s): CHOL, HDL, LDLCALC, TRIG, CHOLHDL, LDLDIRECT in the last 72 hours. Anemia Panel: No results for input(s): VITAMINB12, FOLATE, FERRITIN, TIBC, IRON , RETICCTPCT in the last 72 hours. Urine analysis:    Component Value Date/Time   COLORURINE YELLOW 05/21/2024 1435   APPEARANCEUR CLEAR 05/21/2024 1435   LABSPEC 1.016 05/21/2024 1435   PHURINE 5.0 05/21/2024 1435   GLUCOSEU NEGATIVE 05/21/2024 1435   GLUCOSEU NEGATIVE 01/23/2019 1058   HGBUR NEGATIVE 05/21/2024 1435   BILIRUBINUR NEGATIVE 05/21/2024 1435   KETONESUR NEGATIVE 05/21/2024 1435   PROTEINUR NEGATIVE 05/21/2024 1435   UROBILINOGEN 0.2 01/23/2019 1058   NITRITE NEGATIVE 05/21/2024 1435   LEUKOCYTESUR NEGATIVE 05/21/2024 1435   Sepsis Labs: Invalid input(s): PROCALCITONIN, LACTICIDVEN   SIGNED:  Mignon ONEIDA Bump, MD  [facility] 05/29/2024, 9:44 AM

## 2024-05-29 NOTE — TOC Transition Note (Signed)
 Transition of Care [facility]) - Discharge Note  Patient Details  Name: Brittany [name] MRN: [id number] Date of Birth: 04-19-1929  Transition of Care [facility]) CM/SW Contact:  Duwaine GORMAN Aran, LCSW Phone Number: 05/29/2024, 1:42 PM  Clinical Narrative: Son initially chose [facility] and [address] and insurance authorization was completed on [employer] portal. Son requested that patient go to [place] instead as this is closer to family. CSW confirmed bed with Tanya in admissions and patient can be admitted today. CSW called [employer] to have the SNF updated. Reference ID #is: [id number]. Patient is approved for 05/29/2024-05/31/2024. Discharge summary, discharge orders, and SNF transfer report faxed to facility in hub. Medical necessity form done; PTAR scheduled. Discharge packet completed. Son and RN updated. Care management signing off.  Final next level of care: Skilled Nursing Facility Barriers to Discharge: Barriers Resolved  Patient Goals and CMS Choice Patient states their goals for this hospitalization and ongoing recovery are:: Rehab CMS Medicare.gov Compare Post Acute Care list provided to:: Patient Represenative (must comment) Choice offered to / list presented to : Patient, Adult Children [employer] ownership interest in [facility].provided to:: Patient   Discharge Placement PASRR number recieved: 05/26/24   Patient [facility] bed at: [facility] Patient to be transferred to facility by: PTAR Name of family member notified: Dakisha Schoof (son) Patient and family notified of of transfer: 05/29/24  Discharge Plan and Services Additional resources added to the After Visit Summary for   Discharge Planning Services: CM Consult Post Acute Care Choice: Skilled Nursing Facility          DME Arranged: N/A DME Agency: NA HH Arranged: PT, OT HH Agency: [facility] Date [facility] Agency Contacted: 05/25/24 Time HH Agency Contacted: 1045 Representative spoke with at [facility]  Agency: Amy  Social Drivers of Health (SDOH) Interventions SDOH Screenings   Food Insecurity: No Food Insecurity (05/21/2024)  Housing: Unknown (05/21/2024)  Transportation Needs: No Transportation Needs (05/21/2024)  Utilities: Not At Risk (05/21/2024)  Alcohol Screen: Low Risk  (03/20/2022)  Depression (PHQ2-9): Low Risk  (03/20/2022)  Financial Resource Strain: Low Risk  (03/20/2022)  Physical Activity: Inactive (03/20/2022)  Social Connections: Socially Isolated (05/21/2024)  Stress: No Stress Concern Present (03/20/2022)  Tobacco Use: Low Risk  (05/23/2024)   Readmission Risk Interventions     No data to display

## 2024-05-29 NOTE — Progress Notes (Signed)
 Attempted to call report 2x  to [facility] w/ no answer. [phone] called @ 14:38 & 14:42 Patient transported with her belongings & paper scrubs for transport

## 2024-05-29 NOTE — Progress Notes (Signed)
 Physical Therapy Treatment Patient Details Name: KONSTANCE HAPPEL MRN: [id number] DOB: Nov 29, 1928 Today's Date: 05/29/2024   History of Present Illness 89 yo female admitted with anemia 05/21/24-s/p transfusion x 3 units, acute urinary retention. Hx of DM, breast Ca, oteoporosis, DJD, obesity    PT Comments  PT - Cognition Comments: AxO x 3 pleasant Lady following all commands.  Slow moving and slow to respond. Assisted OOB required increased time and effort.  Assisted with was limited by weakness/fatigue.  General Gait Details: assisted with amb to and from the bathroom 8 feet x 2 with walker.  Pt presents with a very slow gait gait with shuffled steps and forward flexed posture.  Pt is use to using a Rollator. HIGH FALL RISK esp with turns and back stepping.  Assisted back to bed per Pt request.    Prior to admit, Pt was able to amb IND with her Rollator.  LPT has rec Pt will need ST Rehab at SNF to address mobility and functional decline prior to safely returning home.    If plan is discharge home, recommend the following: A lot of help with walking and/or transfers;A lot of help with bathing/dressing/bathroom;Assistance with cooking/housework;Help with stairs or ramp for entrance   Can travel by private vehicle        Equipment Recommendations  None recommended by PT    Recommendations for Other Services       Precautions / Restrictions Precautions Precautions: Fall Restrictions Weight Bearing Restrictions Per Provider Order: No     Mobility  Bed Mobility Overal bed mobility: Needs Assistance Bed Mobility: Supine to Sit, Sit to Supine     Supine to sit: Min assist, Contact guard Sit to supine: Min assist   General bed mobility comments: required increased time and effort.  Moves slowly.    Transfers Overall transfer level: Needs assistance Equipment used: Rolling walker (2 wheels) Transfers: Sit to/from Stand Sit to Stand: Mod assist           General transfer  comment: VC's on proper hand placement as well as increased effort to rise from lower toilet height.  Also assisted with static balance as well as peri care after void.    Ambulation/Gait Ambulation/Gait assistance: Min assist, Mod assist Gait Distance (Feet): 16 Feet Assistive device: Rolling walker (2 wheels) Gait Pattern/deviations: Step-to pattern, Decreased stride length, Trunk flexed Gait velocity: decreased     General Gait Details: assisted with amb to and from the bathroom 8 feet x 2 with walker.  Pt presents with a very slow gait gait with shuffled steps and forward flexed posture.  Pt is use to using a Rollator.   Stairs             Wheelchair Mobility     Tilt Bed    Modified Rankin (Stroke Patients Only)       Balance                                            Communication Communication Communication: No apparent difficulties  Cognition   Behavior During Therapy: WFL for tasks assessed/performed   PT - Cognitive impairments: No apparent impairments                       PT - Cognition Comments: AxO x 3 pleasant Lady following all commands.  Slow moving and  slow to respond. Following commands: Impaired Following commands impaired: Only follows one step commands consistently    Cueing Cueing Techniques: Verbal cues, Gestural cues, Tactile cues, Visual cues  Exercises      General Comments        Pertinent Vitals/Pain Pain Assessment Pain Assessment: Faces Faces Pain Scale: Hurts a little bit Pain Location: general/back Pain Descriptors / Indicators: Aching Pain Intervention(s): Monitored during session    Home Living                          Prior Function            PT Goals (current goals can now be found in the care plan section) Progress towards PT goals: Progressing toward goals    Frequency    Min 2X/week      PT Plan      Co-evaluation              AM-PAC PT 6 Clicks  Mobility   Outcome Measure  Help needed turning from your back to your side while in a flat bed without using bedrails?: A Little Help needed moving from lying on your back to sitting on the side of a flat bed without using bedrails?: A Little Help needed moving to and from a bed to a chair (including a wheelchair)?: A Little Help needed standing up from a chair using your arms (e.g., wheelchair or bedside chair)?: A Little Help needed to walk in hospital room?: A Lot Help needed climbing 3-5 steps with a railing? : Total 6 Click Score: 15    End of Session Equipment Utilized During Treatment: Gait belt Activity Tolerance: Patient limited by fatigue Patient left: with call bell/phone within reach;in bed;with bed alarm set Nurse Communication: Mobility status PT Visit Diagnosis: Muscle weakness (generalized) (M62.81);Difficulty in walking, not elsewhere classified (R26.2)     Time: 1040-1103 PT Time Calculation (min) (ACUTE ONLY): 23 min  Charges:    $Gait Training: 8-22 mins $Therapeutic Activity: 8-22 mins PT General Charges $$ ACUTE PT VISIT: 1 Visit                     Katheryn Leap  PTA Acute  Rehabilitation Services Office M-F          [phone]

## 2024-05-29 NOTE — TOC Progression Note (Signed)
 Transition of Care [facility]) - Progression Note   Patient Details  Name: Brittany [name] MRN: [id number] Date of Birth: 06-Mar-1929  Transition of Care [facility]) CM/SW Contact  Duwaine GORMAN Aran, LCSW Phone Number: 05/29/2024, 9:45 AM  Clinical Narrative: CSW provided list of bed offers with Medicare star ratings. Patient received the following bed offers:  [facility] [address] [place], [place] [address] [phone] Overall rating ??  Below average  [facility] [address] [place], [place] [address] [phone] Overall rating ???? Above average  [facility] and [facility] [address] [place], [place] [address] [phone] Overall rating ?? Below average  [facility] [address] [place], [place] [address] [phone] Overall rating ? Much below average  [facility] and [facility] [address] [place], [place] [address] [phone] Overall rating ??? Average  [facility] : [facility] [address] [place], [place] [address] [phone] Overall rating ? Much below average  [facility] [address] [place], [place] [address] [phone] Overall rating ????? Much above average  [facility] [address] [place], [place] [address] [phone] Overall rating ????? Much above average  [facility] and [facility] [address] [place], [place] [address] [phone] Overall rating ? Much below average  [facility] and [facility] [address] [place], [place] [address] [phone] Overall rating ? Much below average  [facility] and [facility] [address] [place], [place] [address] [phone] Overall rating ?? Below average  [facility] [address] [place], [place]  [address] [phone] Overall rating ?? Below average  Son reported he will come to the hospital to review the bed offers. Care management awaiting bed choice.  Expected Discharge Plan: Skilled Nursing Facility Barriers to Discharge: Continued Medical Work up  Expected Discharge Plan and Services Discharge Planning Services: CM Consult Post Acute Care Choice: Skilled Nursing Facility Living arrangements for the past 2 months: Single Family Home Expected Discharge Date: 05/29/24               HH Arranged: PT, OT HH Agency: [facility] Date [facility] Agency Contacted: 05/25/24 Time HH Agency Contacted: 1045 Representative spoke with at [facility] Agency: Amy  Social Drivers of Health (SDOH) Interventions SDOH Screenings   Food Insecurity: No Food Insecurity (05/21/2024)  Housing: Unknown (05/21/2024)  Transportation Needs: No Transportation Needs (05/21/2024)  Utilities: Not At Risk (05/21/2024)  Alcohol Screen: Low Risk  (03/20/2022)  Depression (PHQ2-9): Low Risk  (03/20/2022)  Financial Resource Strain: Low Risk  (03/20/2022)  Physical Activity: Inactive (03/20/2022)  Social Connections: Socially Isolated (05/21/2024)  Stress: No Stress Concern Present (03/20/2022)  Tobacco Use: Low Risk  (05/23/2024)   Readmission Risk Interventions     No data to display

## 2024-05-30 NOTE — Addendum Note (Signed)
 Addended by: Dagmar Adcox M on: 05/30/2024 12:05 PM   Modules accepted: Orders

## 2024-05-30 NOTE — Telephone Encounter (Signed)
 Thanks Brittany [name] - looks like this was drawn in the hospital, she had not been discharged yet, they were awaiting placement. I think she just left yesterday. Can you order her a CBC to be done in one week and let her family know? Hopefully she will be in a place where they can bring her in for it, or if in rehab, they can do it there for her. Thanks

## 2024-05-30 NOTE — Telephone Encounter (Signed)
 Called and spoke to son.  She is at [facility].  He understands Dr. Leigh would like for patient, Brittany [name] to get a CBC next week.  If they don't do it at [facility] she can come to our lab.  The order is in and he has our location and hours. He expressed understanding

## 2024-06-06 ENCOUNTER — Other Ambulatory Visit: Payer: Self-pay

## 2024-06-06 ENCOUNTER — Encounter (HOSPITAL_COMMUNITY): Payer: Self-pay

## 2024-06-06 ENCOUNTER — Emergency Department (HOSPITAL_COMMUNITY)
Admission: EM | Admit: 2024-06-06 | Discharge: 2024-06-29 | Disposition: E | Attending: Emergency Medicine | Admitting: Emergency Medicine

## 2024-06-06 DIAGNOSIS — I469 Cardiac arrest, cause unspecified: Secondary | ICD-10-CM

## 2024-06-06 LAB — CBC WITH DIFFERENTIAL/PLATELET
Abs Immature Granulocytes: 0.33 K/uL — ABNORMAL HIGH (ref 0.00–0.07)
Basophils Absolute: 0.1 K/uL (ref 0.0–0.1)
Basophils Relative: 1 %
Eosinophils Absolute: 0.1 K/uL (ref 0.0–0.5)
Eosinophils Relative: 0 %
HCT: 40.6 % (ref 36.0–46.0)
Hemoglobin: 11.8 g/dL — ABNORMAL LOW (ref 12.0–15.0)
Immature Granulocytes: 3 %
Lymphocytes Relative: 20 %
Lymphs Abs: 2.7 K/uL (ref 0.7–4.0)
MCH: 26.3 pg (ref 26.0–34.0)
MCHC: 29.1 g/dL — ABNORMAL LOW (ref 30.0–36.0)
MCV: 90.6 fL (ref 80.0–100.0)
Monocytes Absolute: 0.7 K/uL (ref 0.1–1.0)
Monocytes Relative: 6 %
Neutro Abs: 9.4 K/uL — ABNORMAL HIGH (ref 1.7–7.7)
Neutrophils Relative %: 70 %
Platelets: 262 K/uL (ref 150–400)
RBC: 4.48 MIL/uL (ref 3.87–5.11)
RDW: 17.9 % — ABNORMAL HIGH (ref 11.5–15.5)
WBC: 13.3 K/uL — ABNORMAL HIGH (ref 4.0–10.5)
nRBC: 0 % (ref 0.0–0.2)

## 2024-06-06 LAB — COMPREHENSIVE METABOLIC PANEL WITH GFR
ALT: 10 U/L (ref 0–44)
AST: 26 U/L (ref 15–41)
Albumin: 2.4 g/dL — ABNORMAL LOW (ref 3.5–5.0)
Alkaline Phosphatase: 65 U/L (ref 38–126)
Anion gap: 11 (ref 5–15)
BUN: 13 mg/dL (ref 8–23)
CO2: 16 mmol/L — ABNORMAL LOW (ref 22–32)
Calcium: 8.8 mg/dL — ABNORMAL LOW (ref 8.9–10.3)
Chloride: 116 mmol/L — ABNORMAL HIGH (ref 98–111)
Creatinine, Ser: 1.67 mg/dL — ABNORMAL HIGH (ref 0.44–1.00)
GFR, Estimated: 28 mL/min — ABNORMAL LOW (ref >60)
Glucose, Bld: 159 mg/dL — ABNORMAL HIGH (ref 70–99)
Potassium: 3.9 mmol/L (ref 3.5–5.1)
Sodium: 143 mmol/L (ref 135–145)
Total Bilirubin: 0.8 mg/dL (ref 0.0–1.2)
Total Protein: 6.5 g/dL (ref 6.5–8.1)

## 2024-06-06 LAB — I-STAT CHEM 8, ED
BUN: 17 mg/dL (ref 8–23)
Calcium, Ion: 1.14 mmol/L — ABNORMAL LOW (ref 1.15–1.40)
Chloride: 115 mmol/L — ABNORMAL HIGH (ref 98–111)
Creatinine, Ser: 1.7 mg/dL — ABNORMAL HIGH (ref 0.44–1.00)
Glucose, Bld: 164 mg/dL — ABNORMAL HIGH (ref 70–99)
HCT: 38 % (ref 36.0–46.0)
Hemoglobin: 12.9 g/dL (ref 12.0–15.0)
Potassium: 3.9 mmol/L (ref 3.5–5.1)
Sodium: 146 mmol/L — ABNORMAL HIGH (ref 135–145)
TCO2: 16 mmol/L — ABNORMAL LOW (ref 22–32)

## 2024-06-06 LAB — I-STAT CG4 LACTIC ACID, ED: Lactic Acid, Venous: 5.9 mmol/L (ref 0.5–1.9)

## 2024-06-06 LAB — TYPE AND SCREEN
ABO/RH(D): B POS
Antibody Screen: NEGATIVE

## 2024-06-06 MED ORDER — SODIUM BICARBONATE 8.4 % IV SOLN
INTRAVENOUS | Status: DC | PRN
  Administered 2024-06-06: 50 meq via INTRAVENOUS

## 2024-06-06 MED ORDER — EPINEPHRINE 1 MG/10ML IV SOSY
PREFILLED_SYRINGE | INTRAVENOUS | Status: DC | PRN
  Administered 2024-06-06 (×8): 1 mg via INTRAVENOUS

## 2024-06-06 MED ORDER — SODIUM CHLORIDE 0.9 % IV BOLUS: 1000.0000 mL | Freq: Once | INTRAVENOUS | Status: DC

## 2024-06-06 MED ORDER — ETOMIDATE 2 MG/ML IV SOLN
INTRAVENOUS | Status: DC | PRN
  Administered 2024-06-06: 20 mg via INTRAVENOUS

## 2024-06-06 MED ORDER — ROCURONIUM BROMIDE 10 MG/ML (PF) SYRINGE
PREFILLED_SYRINGE | INTRAVENOUS | Status: DC | PRN
  Administered 2024-06-06: 70 mg via INTRAVENOUS

## 2024-06-07 ENCOUNTER — Ambulatory Visit: Admitting: Physician Assistant

## 2024-06-29 NOTE — ED Notes (Signed)
 RN spoke to son, Ubaldo Ubaldo asked RN to leave rings up front with security and will pick them up in an hour.

## 2024-06-29 NOTE — Code Documentation (Signed)
 TOD called by Dr. Garrick.

## 2024-06-29 NOTE — ED Provider Notes (Signed)
 [place] EMERGENCY DEPARTMENT AT [facility] Provider Note   CSN: [id number] Arrival date & time: June 08, 2024  1209     Patient presents with: Hypotension   Brittany [name] is a 89 y.o. female.   HPI   Patient with a history of admission earlier this month after symptomatic anemia, with hemoglobin of 4 now presents with concern for listlessness from her nursing facility.  Cognitive impairment is limiting.   Prior to Admission medications   Medication Sig Start Date End Date Taking? Authorizing Provider  atenolol  (TENORMIN ) 25 MG tablet Take 1 tablet (25 mg total) by mouth daily. 05/25/24   Rai, Nydia POUR, MD  bismuth  subsalicylate (PEPTO-BISMOL) 262 MG chewable tablet Chew 2 tablets (524 mg total) by mouth 4 (four) times daily -  before meals and at bedtime for 14 days. 05/25/24 06/08/24  Rai, Nydia POUR, MD  cyanocobalamin  (VITAMIN B12) 1000 MCG tablet Take 1 tablet (1,000 mcg total) by mouth daily. 05/25/24   Rai, Nydia POUR, MD  metronidazole  (FLAGYL ) 375 MG capsule Take 1 capsule (375 mg total) by mouth 3 (three) times daily for 14 days. 05/25/24 06/08/24  Rai, Nydia POUR, MD  omeprazole  (PRILOSEC  OTC) 20 MG tablet Take 1 tablet (20 mg total) by mouth 2 (two) times daily for 14 days. 05/25/24 06/08/24  Rai, Ripudeep K, MD  ondansetron  (ZOFRAN ) 4 MG tablet Take 1 tablet (4 mg total) by mouth every 6 (six) hours as needed for nausea. 05/25/24   Rai, Ripudeep K, MD  tetracycline  (SUMYCIN ) 500 MG capsule Take 1 capsule (500 mg total) by mouth 4 (four) times daily for 14 days. 05/25/24 06/08/24  Rai, Nydia POUR, MD    Allergies: Hctz [hydrochlorothiazide ], Amlodipine  besylate, Chlorthalidone , Indapamide , Olmesartan , Alendronate  sodium, Lisinopril, Spironolactone , Bystolic  [nebivolol  hcl], and Valsartan     Review of Systems  Updated Vital Signs There were no vitals taken for this visit.  Physical Exam Vitals and nursing note reviewed.  Constitutional:      Appearance:  She is well-developed.     Comments: Listless elderly female intermittently speaking briefly, then not following commands  HENT:     Head: Normocephalic and atraumatic.  Eyes:     Conjunctiva/sclera: Conjunctivae normal.  Cardiovascular:     Rate and Rhythm: Normal rate and regular rhythm.  Pulmonary:     Effort: Pulmonary effort is normal. No respiratory distress.     Breath sounds: No stridor.  Abdominal:     General: There is no distension.  Skin:    General: Skin is warm and dry.  Neurological:     Cranial Nerves: No cranial nerve deficit.     Comments: Not fully participatory in exam, though intermittently patient is speaking, she is then subsequently nonverbal  Psychiatric:        Mood and Affect: Mood normal.     (all labs ordered are listed, but only abnormal results are displayed) Labs Reviewed  COMPREHENSIVE METABOLIC PANEL WITH GFR  CBC WITH DIFFERENTIAL/PLATELET  URINALYSIS, W/ REFLEX TO CULTURE (INFECTION SUSPECTED)  I-STAT CG4 LACTIC ACID, ED  POC OCCULT BLOOD, ED  I-STAT CHEM 8, ED  TYPE AND SCREEN    EKG: None  Radiology: No results found.   Procedures   Medications Ordered in the ED  sodium chloride  0.9 % bolus 1,000 mL (has no administration in time range)  sodium chloride  0.9 % bolus 1,000 mL (has no administration in time range)  Medical Decision Making Elderly female with recent hospitalization for acute blood loss anemia now presents with altered mental status.  Patient initially hypotensive, withdrawn, but then talkative.  Soon after initial assessment, patient became unresponsive, bradycardic, required CPR.  CPR commenced with intubation performed without complication, please see full nursing notes. After the patient had received approximately 15 minutes of CPR family was available and discussed her presentation with them, demonstrating the resuscitation to the son, his girlfriend, and after additional  efforts were not resulting in return of spontaneous regulation patient was pronounced dead at 40. Initial labs notable for lactic acidosis, though no anemia.  Amount and/or Complexity of Data Reviewed External Data Reviewed: notes.    Details: Recent hospitalization with transfusion required for critical anemia reviewed Labs: ordered. Decision-making details documented in ED Course. ECG/medicine tests: ordered and independent interpretation performed. Decision-making details documented in ED Course.  Risk Prescription drug management. Decision regarding hospitalization. Diagnosis or treatment significantly limited by social determinants of health.   As above, following initial presentation with listlessness, patient eventually required CPR, intubation, had no return of spontaneous circulation, deceased in the emergency department.  INTUBATION Performed by: Lamar Salen  Required items: required blood products, implants, devices, and special equipment available Patient identity confirmed: provided demographic data and hospital-assigned identification number Time out: Immediately prior to procedure a time out was called to verify the correct patient, procedure, equipment, support staff and site/side marked as required.  Indications: airway protection, CPR  Intubation method: Glidescope Laryngoscopy   Preoxygenation: BVM  Sedatives: 20Etomidate Paralytic: 100 Rocuronium   Tube Size: 7.5 cuffed  Post-procedure assessment: chest rise and ETCO2 monitor Breath sounds: equal and absent over the epigastrium Tube secured with: ETT holder   Patient tolerated the procedure well with no immediate complications.  CRITICAL CARE Performed by: Lamar Salen Total critical care time: 40 minutes Critical care time was exclusive of separately billable procedures and treating other patients. Critical care was necessary to treat or prevent imminent or life-threatening  deterioration. Critical care was time spent personally by me on the following activities: development of treatment plan with patient and/or surrogate as well as nursing, discussions with consultants, evaluation of patient's response to treatment, examination of patient, obtaining history from patient or surrogate, ordering and performing treatments and interventions, ordering and review of laboratory studies, ordering and review of radiographic studies, pulse oximetry and re-evaluation of patient's condition.  Final diagnoses:  Cardiac arrest [facility])   ED Discharge Orders     None          Salen Lamar, MD Jun 11, 2024 1401

## 2024-06-29 NOTE — Code Documentation (Signed)
Pulse check, asystole, cpr resumed

## 2024-06-29 NOTE — Code Documentation (Signed)
 Family updated as to patient's status.

## 2024-06-29 NOTE — ED Triage Notes (Signed)
 C/O dizziness, BP 70 systolic, EMS gave 600cc of NS. Altered at baseline. Pt lethargic.

## 2024-06-29 NOTE — Code Documentation (Signed)
 Cpr asystole, cpr resumed.

## 2024-06-29 NOTE — Code Documentation (Signed)
 Pt intubated, + color change, breath sounds equal bil 7.5  23@lip 

## 2024-06-29 NOTE — Code Documentation (Signed)
 Pulse check, rosc

## 2024-06-29 NOTE — Code Documentation (Signed)
Pulse check, pea, cpr resumed 

## 2024-06-29 NOTE — Code Documentation (Signed)
CPR intiated

## 2024-06-29 NOTE — ED Notes (Signed)
 CCMD contacted to inform that the patient had coded and that the assigned RN was in with the patient.

## 2024-06-29 NOTE — Code Documentation (Signed)
No pulse, cpr resumed 

## 2024-06-29 NOTE — Code Documentation (Signed)
Pulse check, asystole, CPR resumed 

## 2024-06-29 NOTE — Code Documentation (Signed)
Cpr resumed

## 2024-06-29 DEATH — deceased

## 2024-07-11 ENCOUNTER — Ambulatory Visit: Admitting: Nurse Practitioner
